# Patient Record
Sex: Female | Born: 1935 | Race: White | Hispanic: No | Smoking: Former smoker
Health system: Southern US, Community
[De-identification: ages and names within clinical notes are randomized; demographics above are authoritative.]

## PROBLEM LIST (undated history)

## (undated) DIAGNOSIS — I82409 Acute embolism and thrombosis of unspecified deep veins of unspecified lower extremity: Secondary | ICD-10-CM

## (undated) DIAGNOSIS — M1990 Unspecified osteoarthritis, unspecified site: Secondary | ICD-10-CM

## (undated) DIAGNOSIS — K449 Diaphragmatic hernia without obstruction or gangrene: Secondary | ICD-10-CM

## (undated) DIAGNOSIS — C189 Malignant neoplasm of colon, unspecified: Secondary | ICD-10-CM

## (undated) DIAGNOSIS — I219 Acute myocardial infarction, unspecified: Secondary | ICD-10-CM

## (undated) DIAGNOSIS — I10 Essential (primary) hypertension: Secondary | ICD-10-CM

## (undated) DIAGNOSIS — I2510 Atherosclerotic heart disease of native coronary artery without angina pectoris: Secondary | ICD-10-CM

## (undated) DIAGNOSIS — G629 Polyneuropathy, unspecified: Secondary | ICD-10-CM

## (undated) DIAGNOSIS — F329 Major depressive disorder, single episode, unspecified: Secondary | ICD-10-CM

## (undated) DIAGNOSIS — F419 Anxiety disorder, unspecified: Secondary | ICD-10-CM

## (undated) DIAGNOSIS — K635 Polyp of colon: Secondary | ICD-10-CM

## (undated) DIAGNOSIS — I509 Heart failure, unspecified: Secondary | ICD-10-CM

## (undated) DIAGNOSIS — R32 Unspecified urinary incontinence: Secondary | ICD-10-CM

## (undated) DIAGNOSIS — D649 Anemia, unspecified: Secondary | ICD-10-CM

## (undated) DIAGNOSIS — K219 Gastro-esophageal reflux disease without esophagitis: Secondary | ICD-10-CM

## (undated) DIAGNOSIS — A419 Sepsis, unspecified organism: Secondary | ICD-10-CM

## (undated) DIAGNOSIS — C801 Malignant (primary) neoplasm, unspecified: Secondary | ICD-10-CM

## (undated) DIAGNOSIS — E785 Hyperlipidemia, unspecified: Secondary | ICD-10-CM

## (undated) DIAGNOSIS — E039 Hypothyroidism, unspecified: Secondary | ICD-10-CM

## (undated) DIAGNOSIS — H269 Unspecified cataract: Secondary | ICD-10-CM

## (undated) DIAGNOSIS — R410 Disorientation, unspecified: Secondary | ICD-10-CM

## (undated) HISTORY — DX: Polyp of colon: K63.5

## (undated) HISTORY — DX: Anemia, unspecified: D64.9

## (undated) HISTORY — PX: COLONOSCOPY W/ BIOPSIES: SHX1374

## (undated) HISTORY — DX: Hypothyroidism, unspecified: E03.9

## (undated) HISTORY — DX: Atherosclerotic heart disease of native coronary artery without angina pectoris: I25.10

## (undated) HISTORY — DX: Anxiety disorder, unspecified: F41.9

## (undated) HISTORY — DX: Essential (primary) hypertension: I10

## (undated) HISTORY — DX: Unspecified urinary incontinence: R32

## (undated) HISTORY — DX: Hyperlipidemia, unspecified: E78.5

## (undated) HISTORY — DX: Diaphragmatic hernia without obstruction or gangrene: K44.9

## (undated) HISTORY — DX: Malignant neoplasm of colon, unspecified: C18.9

## (undated) HISTORY — PX: OTHER SURGICAL HISTORY: SHX169

## (undated) HISTORY — PX: THYROID SURGERY: SHX805

## (undated) HISTORY — DX: Unspecified osteoarthritis, unspecified site: M19.90

## (undated) HISTORY — DX: Unspecified cataract: H26.9

## (undated) HISTORY — PX: LUMBAR LAMINECTOMY: SHX95

## (undated) HISTORY — PX: ESOPHAGOGASTRODUODENOSCOPY: SHX1529

## (undated) HISTORY — DX: Polyneuropathy, unspecified: G62.9

---
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
## 1997-07-12 DIAGNOSIS — I2510 Atherosclerotic heart disease of native coronary artery without angina pectoris: Secondary | ICD-10-CM

## 1997-07-12 HISTORY — DX: Atherosclerotic heart disease of native coronary artery without angina pectoris: I25.10

## 1997-07-12 HISTORY — PX: CORONARY ARTERY BYPASS GRAFT: SHX141

## 1997-12-26 ENCOUNTER — Other Ambulatory Visit: Admission: RE | Admit: 1997-12-26 | Discharge: 1997-12-26 | Payer: Self-pay | Admitting: Obstetrics and Gynecology

## 1998-01-28 ENCOUNTER — Inpatient Hospital Stay (HOSPITAL_COMMUNITY): Admission: EM | Admit: 1998-01-28 | Discharge: 1998-02-08 | Payer: Self-pay | Admitting: Emergency Medicine

## 1998-07-12 HISTORY — PX: CAROTID STENT: SHX1301

## 1998-07-16 ENCOUNTER — Inpatient Hospital Stay (HOSPITAL_COMMUNITY): Admission: EM | Admit: 1998-07-16 | Discharge: 1998-07-22 | Payer: Self-pay | Admitting: *Deleted

## 1998-07-17 ENCOUNTER — Encounter: Payer: Self-pay | Admitting: Cardiovascular Disease

## 1999-01-14 ENCOUNTER — Other Ambulatory Visit: Admission: RE | Admit: 1999-01-14 | Discharge: 1999-01-14 | Payer: Self-pay | Admitting: Family Medicine

## 1999-06-22 ENCOUNTER — Encounter: Admission: RE | Admit: 1999-06-22 | Discharge: 1999-07-10 | Payer: Self-pay | Admitting: *Deleted

## 2000-01-26 ENCOUNTER — Other Ambulatory Visit: Admission: RE | Admit: 2000-01-26 | Discharge: 2000-01-26 | Payer: Self-pay | Admitting: Family Medicine

## 2000-03-09 ENCOUNTER — Ambulatory Visit (HOSPITAL_COMMUNITY): Admission: RE | Admit: 2000-03-09 | Discharge: 2000-03-09 | Payer: Self-pay | Admitting: Cardiovascular Disease

## 2000-06-15 ENCOUNTER — Ambulatory Visit: Admission: RE | Admit: 2000-06-15 | Discharge: 2000-06-15 | Payer: Self-pay | Admitting: Cardiovascular Disease

## 2000-09-19 ENCOUNTER — Ambulatory Visit: Admission: RE | Admit: 2000-09-19 | Discharge: 2000-09-19 | Payer: Self-pay | Admitting: Cardiovascular Disease

## 2001-01-31 ENCOUNTER — Other Ambulatory Visit: Admission: RE | Admit: 2001-01-31 | Discharge: 2001-01-31 | Payer: Self-pay | Admitting: Family Medicine

## 2001-08-02 ENCOUNTER — Ambulatory Visit: Admission: RE | Admit: 2001-08-02 | Discharge: 2001-08-02 | Payer: Self-pay | Admitting: Cardiovascular Disease

## 2001-10-10 HISTORY — PX: CORONARY STENT PLACEMENT: SHX1402

## 2001-10-11 ENCOUNTER — Encounter: Payer: Self-pay | Admitting: Internal Medicine

## 2001-10-11 ENCOUNTER — Inpatient Hospital Stay (HOSPITAL_COMMUNITY): Admission: AD | Admit: 2001-10-11 | Discharge: 2001-10-13 | Payer: Self-pay | Admitting: Internal Medicine

## 2002-02-02 ENCOUNTER — Other Ambulatory Visit: Admission: RE | Admit: 2002-02-02 | Discharge: 2002-02-02 | Payer: Self-pay | Admitting: Family Medicine

## 2002-03-29 ENCOUNTER — Ambulatory Visit (HOSPITAL_COMMUNITY): Admission: RE | Admit: 2002-03-29 | Discharge: 2002-03-29 | Payer: Self-pay | Admitting: Cardiovascular Disease

## 2002-07-16 ENCOUNTER — Encounter: Admission: RE | Admit: 2002-07-16 | Discharge: 2002-07-16 | Payer: Self-pay | Admitting: Family Medicine

## 2002-07-16 ENCOUNTER — Encounter: Payer: Self-pay | Admitting: Family Medicine

## 2002-08-14 ENCOUNTER — Encounter (INDEPENDENT_AMBULATORY_CARE_PROVIDER_SITE_OTHER): Payer: Self-pay | Admitting: Gastroenterology

## 2002-08-16 ENCOUNTER — Encounter: Payer: Self-pay | Admitting: Gastroenterology

## 2002-08-16 ENCOUNTER — Ambulatory Visit (HOSPITAL_COMMUNITY): Admission: RE | Admit: 2002-08-16 | Discharge: 2002-08-16 | Payer: Self-pay | Admitting: Gastroenterology

## 2002-10-08 ENCOUNTER — Encounter: Payer: Self-pay | Admitting: Gastroenterology

## 2002-10-08 ENCOUNTER — Ambulatory Visit (HOSPITAL_COMMUNITY): Admission: RE | Admit: 2002-10-08 | Discharge: 2002-10-08 | Payer: Self-pay | Admitting: Gastroenterology

## 2003-04-11 ENCOUNTER — Other Ambulatory Visit: Admission: RE | Admit: 2003-04-11 | Discharge: 2003-04-11 | Payer: Self-pay | Admitting: Family Medicine

## 2003-05-30 ENCOUNTER — Ambulatory Visit (HOSPITAL_COMMUNITY): Admission: RE | Admit: 2003-05-30 | Discharge: 2003-05-30 | Payer: Self-pay | Admitting: Cardiovascular Disease

## 2004-04-07 ENCOUNTER — Other Ambulatory Visit: Admission: RE | Admit: 2004-04-07 | Discharge: 2004-04-07 | Payer: Self-pay | Admitting: Family Medicine

## 2004-06-26 ENCOUNTER — Ambulatory Visit: Payer: Self-pay

## 2004-06-26 ENCOUNTER — Ambulatory Visit: Payer: Self-pay | Admitting: Cardiovascular Disease

## 2004-10-20 ENCOUNTER — Ambulatory Visit (HOSPITAL_COMMUNITY): Admission: RE | Admit: 2004-10-20 | Discharge: 2004-10-20 | Payer: Self-pay | Admitting: Family Medicine

## 2005-08-16 ENCOUNTER — Ambulatory Visit: Payer: Self-pay | Admitting: Gastroenterology

## 2005-08-26 ENCOUNTER — Ambulatory Visit: Payer: Self-pay | Admitting: Cardiovascular Disease

## 2005-08-31 ENCOUNTER — Ambulatory Visit: Payer: Self-pay | Admitting: Gastroenterology

## 2005-09-14 ENCOUNTER — Other Ambulatory Visit: Admission: RE | Admit: 2005-09-14 | Discharge: 2005-09-14 | Payer: Self-pay | Admitting: Family Medicine

## 2006-03-11 ENCOUNTER — Ambulatory Visit: Payer: Self-pay | Admitting: Cardiovascular Disease

## 2006-03-11 ENCOUNTER — Ambulatory Visit: Payer: Self-pay

## 2006-09-15 ENCOUNTER — Ambulatory Visit: Payer: Self-pay | Admitting: Cardiovascular Disease

## 2006-11-17 ENCOUNTER — Ambulatory Visit: Payer: Self-pay | Admitting: Cardiovascular Disease

## 2006-11-17 LAB — CONVERTED CEMR LAB
ALT: 27 units/L (ref 0–40)
AST: 22 units/L (ref 0–37)
Albumin: 3.7 g/dL (ref 3.5–5.2)
Alkaline Phosphatase: 33 units/L — ABNORMAL LOW (ref 39–117)
Bilirubin, Direct: 0.1 mg/dL (ref 0.0–0.3)
Total Bilirubin: 0.7 mg/dL (ref 0.3–1.2)
Total Protein: 6.7 g/dL (ref 6.0–8.3)

## 2006-12-12 ENCOUNTER — Ambulatory Visit: Payer: Self-pay | Admitting: Cardiovascular Disease

## 2007-02-22 ENCOUNTER — Encounter: Payer: Self-pay | Admitting: Cardiovascular Disease

## 2007-02-22 ENCOUNTER — Ambulatory Visit: Payer: Self-pay

## 2007-03-30 ENCOUNTER — Ambulatory Visit: Payer: Self-pay

## 2007-03-30 ENCOUNTER — Ambulatory Visit: Payer: Self-pay | Admitting: Cardiovascular Disease

## 2007-09-20 ENCOUNTER — Ambulatory Visit: Payer: Self-pay | Admitting: Cardiovascular Disease

## 2007-11-06 ENCOUNTER — Ambulatory Visit: Payer: Self-pay | Admitting: Cardiovascular Disease

## 2008-01-24 ENCOUNTER — Ambulatory Visit: Payer: Self-pay | Admitting: Internal Medicine

## 2008-01-24 DIAGNOSIS — R195 Other fecal abnormalities: Secondary | ICD-10-CM

## 2008-01-24 DIAGNOSIS — Z860100 Personal history of colon polyps, unspecified: Secondary | ICD-10-CM | POA: Insufficient documentation

## 2008-01-24 DIAGNOSIS — K625 Hemorrhage of anus and rectum: Secondary | ICD-10-CM

## 2008-01-26 ENCOUNTER — Ambulatory Visit: Payer: Self-pay | Admitting: Internal Medicine

## 2008-01-26 ENCOUNTER — Encounter: Admission: RE | Admit: 2008-01-26 | Discharge: 2008-01-26 | Payer: Self-pay | Admitting: Family Medicine

## 2008-01-26 ENCOUNTER — Encounter: Payer: Self-pay | Admitting: Internal Medicine

## 2008-01-26 DIAGNOSIS — D649 Anemia, unspecified: Secondary | ICD-10-CM

## 2008-01-30 LAB — CONVERTED CEMR LAB
Basophils Absolute: 0 10*3/uL (ref 0.0–0.1)
Basophils Relative: 0.4 % (ref 0.0–3.0)
Eosinophils Absolute: 0.2 10*3/uL (ref 0.0–0.7)
Eosinophils Relative: 3.2 % (ref 0.0–5.0)
HCT: 36.1 % (ref 36.0–46.0)
Hemoglobin: 12.2 g/dL (ref 12.0–15.0)
Lymphocytes Relative: 37.9 % (ref 12.0–46.0)
MCHC: 33.8 g/dL (ref 30.0–36.0)
MCV: 94.5 fL (ref 78.0–100.0)
Monocytes Absolute: 0.5 10*3/uL (ref 0.1–1.0)
Monocytes Relative: 7.2 % (ref 3.0–12.0)
Neutro Abs: 3.3 10*3/uL (ref 1.4–7.7)
Neutrophils Relative %: 51.3 % (ref 43.0–77.0)
Platelets: 297 10*3/uL (ref 150–400)
RBC: 3.82 M/uL — ABNORMAL LOW (ref 3.87–5.11)
RDW: 12.4 % (ref 11.5–14.6)
WBC: 6.5 10*3/uL (ref 4.5–10.5)

## 2008-02-01 ENCOUNTER — Encounter: Payer: Self-pay | Admitting: Internal Medicine

## 2008-05-03 ENCOUNTER — Ambulatory Visit: Payer: Self-pay | Admitting: Cardiovascular Disease

## 2008-05-20 DIAGNOSIS — E7800 Pure hypercholesterolemia, unspecified: Secondary | ICD-10-CM | POA: Insufficient documentation

## 2008-05-20 DIAGNOSIS — I119 Hypertensive heart disease without heart failure: Secondary | ICD-10-CM

## 2008-05-20 DIAGNOSIS — I2510 Atherosclerotic heart disease of native coronary artery without angina pectoris: Secondary | ICD-10-CM

## 2008-05-20 DIAGNOSIS — E039 Hypothyroidism, unspecified: Secondary | ICD-10-CM

## 2008-10-18 ENCOUNTER — Encounter: Payer: Self-pay | Admitting: Cardiovascular Disease

## 2008-10-18 ENCOUNTER — Ambulatory Visit: Payer: Self-pay | Admitting: Cardiovascular Disease

## 2008-10-18 DIAGNOSIS — R55 Syncope and collapse: Secondary | ICD-10-CM | POA: Insufficient documentation

## 2009-01-31 ENCOUNTER — Telehealth: Payer: Self-pay | Admitting: Cardiovascular Disease

## 2009-02-12 ENCOUNTER — Telehealth: Payer: Self-pay | Admitting: Cardiovascular Disease

## 2009-03-20 ENCOUNTER — Encounter (INDEPENDENT_AMBULATORY_CARE_PROVIDER_SITE_OTHER): Payer: Self-pay | Admitting: *Deleted

## 2009-03-24 ENCOUNTER — Encounter: Admission: RE | Admit: 2009-03-24 | Discharge: 2009-03-24 | Payer: Self-pay | Admitting: Family Medicine

## 2009-05-08 ENCOUNTER — Ambulatory Visit: Payer: Self-pay | Admitting: Cardiovascular Disease

## 2009-06-02 ENCOUNTER — Telehealth: Payer: Self-pay | Admitting: Cardiovascular Disease

## 2009-07-01 ENCOUNTER — Encounter: Payer: Self-pay | Admitting: Cardiovascular Disease

## 2009-07-12 HISTORY — PX: CHOLECYSTECTOMY: SHX55

## 2009-09-26 ENCOUNTER — Encounter: Payer: Self-pay | Admitting: Cardiovascular Disease

## 2009-09-30 ENCOUNTER — Ambulatory Visit: Payer: Self-pay | Admitting: Cardiovascular Disease

## 2009-10-24 ENCOUNTER — Ambulatory Visit (HOSPITAL_COMMUNITY): Admission: RE | Admit: 2009-10-24 | Discharge: 2009-10-24 | Payer: Self-pay | Admitting: Orthopaedic Surgery

## 2009-12-25 ENCOUNTER — Telehealth: Payer: Self-pay | Admitting: Cardiovascular Disease

## 2010-01-19 ENCOUNTER — Telehealth: Payer: Self-pay | Admitting: Cardiovascular Disease

## 2010-03-23 ENCOUNTER — Encounter: Payer: Self-pay | Admitting: Cardiovascular Disease

## 2010-04-24 ENCOUNTER — Ambulatory Visit: Payer: Self-pay | Admitting: Cardiovascular Disease

## 2010-04-24 ENCOUNTER — Encounter: Payer: Self-pay | Admitting: Cardiovascular Disease

## 2010-06-02 ENCOUNTER — Ambulatory Visit: Payer: Self-pay | Admitting: Internal Medicine

## 2010-06-02 ENCOUNTER — Telehealth: Payer: Self-pay | Admitting: Internal Medicine

## 2010-06-02 DIAGNOSIS — K5900 Constipation, unspecified: Secondary | ICD-10-CM | POA: Insufficient documentation

## 2010-06-02 LAB — CONVERTED CEMR LAB
BUN: 20 mg/dL (ref 6–23)
CO2: 34 meq/L — ABNORMAL HIGH (ref 19–32)
Chloride: 102 meq/L (ref 96–112)
Creatinine, Ser: 1 mg/dL (ref 0.4–1.2)
Potassium: 4.7 meq/L (ref 3.5–5.1)

## 2010-06-22 ENCOUNTER — Encounter: Payer: Self-pay | Admitting: Cardiovascular Disease

## 2010-08-11 NOTE — Assessment & Plan Note (Signed)
Summary: f86m/dm   Primary Provider:  Rudi Heap MD   History of Present Illness: Rachel Conner is seen today in followup for history of coronary artery disease and bypass surgery.  I believe her bypass was in 99.  He subsequently had a stenting of the native RCA she has an occluded vein graft to the right.  She also has a stent in the native obtuse marginal branch.  Her last catheter was in 2003.  Her last Myoview in 2008 was nonischemic.her ejection fraction 64% at that time. She is not having t any significant chest pain.  She has had a persistant dry cough with lisinipril  We will switch her to Cozaar.  She continues to need to work on her diet and carb intake.  He BS has been ok as has her cholesterol as checked by her primary  She had succesful gallbladder surgery in March.  She has had a history of polyp removal about 3 years ago and had some "blood in her stool" post op.  We will get her in to see gi for F/U colonoscopy  Current Problems (verified): 1)  Syncope, Vasovagal  (ICD-780.2) 2)  Hypothyroidism  (ICD-244.9) 3)  Hypercholesterolemia, Mixed  (ICD-272.0) 4)  Hypertensive Cardiovascular Disease, Benign  (ICD-402.10) 5)  Coronary Atherosclerosis, Native Vessel  (ICD-414.01) 6)  Anemia  (ICD-285.9) 7)  Blood in Stool, Occult  (ICD-792.1) 8)  Personal Hx Colonic Polyps  (ICD-V12.72) 9)  Rectal Bleeding  (ICD-569.3)  Current Medications (verified): 1)  Crestor 20 Mg Tabs (Rosuvastatin Calcium) .... Take One Tablet By Mouth Daily. 2)  Vitamin E 400 Unit  Caps (Vitamin E) 3)  Synthroid 100 Mcg  Tabs (Levothyroxine Sodium) .... Take 1 Tablet By Mouth Once A Day 4)  Bayer Aspirin 325 Mg  Tabs (Aspirin) .Marland Kitchen.. 1 Once Daily 5)  Alprazolam 0.5 Mg  Tabs (Alprazolam) .... As Needed 6)  Prozac 20 Mg  Caps (Fluoxetine Hcl) .... Take 1 Tablet By Mouth Once A Day 7)  Niaspan 500 Mg  Cr-Tabs (Niacin (Antihyperlipidemic)) .... Take 1 Tablet By Mouth Once A Day 8)  Pantoprazole Sodium 40 Mg  Tbec  (Pantoprazole Sodium) .Marland Kitchen.. 1qd 9)  Tricor 145 Mg  Tabs (Fenofibrate) .Marland Kitchen.. 1 Once Daily 10)  Metoprolol Tartrate 50 Mg Tabs (Metoprolol Tartrate) .... Take One Tablet By Mouth Twice A Day 11)  Multivitamins   Tabs (Multiple Vitamin) .Marland Kitchen.. 1 Once Daily 12)  Fish Oil   Oil (Fish Oil) .Marland Kitchen.. 1 Once Daily 13)  Cozaar 50 Mg Tabs (Losartan Potassium) .Marland Kitchen.. 1 Once Daily 14)  Vitamin D 1000 Unit Tabs (Cholecalciferol) .... 2 Tabs Po Once Daily  Allergies (verified): 1)  ! * Nitroglycerin  Past History:  Past Medical History: Last updated: 05/20/2008 Arthritis CAD/CABG Diabetes Hyperlipidemia Hypertension Hypothyroidism Colon Polyps Hiatal Hernia GERD Peripheral neuropathy Anxiety  Past Surgical History: Last updated: 01/23/2008 CABG 1999 Stent RCA 07/1998 Circumflex stent 4/03 Back Surgery Thyroid Surgery  Family History: Last updated: 01/24/2008 No FH of Colon Cancer: Family History of Heart Disease: father grandfather brother mother  Social History: Last updated: 01/24/2008 Occupation: retired Patient is a former smoker.  Alcohol Use - no Illicit Drug Use - no Patient does not get regular exercise.  3 grown children  Review of Systems       Denies fever, malais, weight loss, blurry vision, decreased visual acuity, cough, sputum, SOB, hemoptysis, pleuritic pain, palpitaitons, heartburn, abdominal pain, melena, lower extremity edema, claudication, or rash.   Vital Signs:  Patient profile:  75 year old female Height:      65 inches Weight:      231 pounds BMI:     38.58 Pulse rate:   66 / minute Resp:     16 per minute BP sitting:   162 / 78  (left arm)  Vitals Entered By: Marrion Coy, CNA (April 24, 2010 11:15 AM)  Physical Exam  General:  Affect appropriate Healthy:  appears stated age HEENT: normal Neck supple with no adenopathy JVP normal no bruits no thyromegaly Lungs clear with no wheezing and good diaphragmatic motion Heart:  S1/S2 no murmur,rub,  gallop or click PMI normal Abdomen: benighn, BS positve, no tenderness, no AAA no bruit.  No HSM or HJR Distal pulses intact with no bruits No edema Neuro non-focal Skin warm and dry    Impression & Recommendations:  Problem # 1:  HYPERCHOLESTEROLEMIA, MIXED (ICD-272.0) Will get labs from Dr Georgia Lopes Her updated medication list for this problem includes:    Crestor 20 Mg Tabs (Rosuvastatin calcium) .Marland Kitchen... Take one tablet by mouth daily.    Niaspan 500 Mg Cr-tabs (Niacin (antihyperlipidemic)) .Marland Kitchen... Take 1 tablet by mouth once a day    Tricor 145 Mg Tabs (Fenofibrate) .Marland Kitchen... 1 once daily  Problem # 2:  HYPERTENSIVE CARDIOVASCULAR DISEASE, BENIGN (ICD-402.10)  Well contorlled Her updated medication list for this problem includes:    Bayer Aspirin 325 Mg Tabs (Aspirin) .Marland Kitchen... 1 once daily    Metoprolol Tartrate 50 Mg Tabs (Metoprolol tartrate) .Marland Kitchen... Take one tablet by mouth twice a day    Cozaar 50 Mg Tabs (Losartan potassium) .Marland Kitchen... 1 once daily  Her updated medication list for this problem includes:    Bayer Aspirin 325 Mg Tabs (Aspirin) .Marland Kitchen... 1 once daily    Metoprolol Tartrate 50 Mg Tabs (Metoprolol tartrate) .Marland Kitchen... Take one tablet by mouth twice a day    Cozaar 50 Mg Tabs (Losartan potassium) .Marland Kitchen... 1 once daily  Problem # 3:  CORONARY ATHEROSCLEROSIS, NATIVE VESSEL (ICD-414.01)  Stable no angina Her updated medication list for this problem includes:    Bayer Aspirin 325 Mg Tabs (Aspirin) .Marland Kitchen... 1 once daily    Metoprolol Tartrate 50 Mg Tabs (Metoprolol tartrate) .Marland Kitchen... Take one tablet by mouth twice a day  Orders: EKG w/ Interpretation (93000)  Problem # 4:  BLOOD IN STOOL, OCCULT (ICD-792.1) Arrange F/u with gi for colonoscopy given history of polyps 3 years ago  Other Orders: Gastroenterology Referral (GI)  Patient Instructions: 1)  Your physician recommends that you schedule a follow-up appointment in: 6 MONTHS WITH DR Eden Emms 2)  Your physician recommends that you  continue on your current medications as directed. Please refer to the Current Medication list given to you today. 3)  You have been referred to GI REFERRAL F/U ON POLYPS AND BLOOD IN STOOL

## 2010-08-11 NOTE — Assessment & Plan Note (Signed)
Summary: F6M/CY   Primary Provider:  Rudi Heap MD  CC:  surgical clearnce gallbladder.  History of Present Illness: Rachel Conner is seen today in followup for history of coronary artery disease and bypass surgery.  I believe her bypass was in 99.  He subsequently had a stenting of the native RCA she has an occluded vein graft to the right.  She also has a stent in the native obtuse marginal branch.  Her last catheter was in 2003.  Her last Myoview in 2008 was nonischemic.her ejection fraction 64% at that time. She is not having t any significant chest pain.  She has had a persistant dry cough with lisinipril  We will switch her to Cozaar.  She continues to need to work on her diet and carb intake.  He BS has been ok as has her cholesterol as checked by her primary  She has had gallbladder problems the last 3 weeks and is scheduled to have a lap choly with Dr Rayburn Ma soon.  I told her she is cleared to have this and can stop ASA 5 days before.  We are happy to follow her in the periop phase if needed.    Current Problems (verified): 1)  Syncope, Vasovagal  (ICD-780.2) 2)  Hypothyroidism  (ICD-244.9) 3)  Hypercholesterolemia, Mixed  (ICD-272.0) 4)  Hypertensive Cardiovascular Disease, Benign  (ICD-402.10) 5)  Coronary Atherosclerosis, Native Vessel  (ICD-414.01) 6)  Anemia  (ICD-285.9) 7)  Blood in Stool, Occult  (ICD-792.1) 8)  Personal Hx Colonic Polyps  (ICD-V12.72) 9)  Rectal Bleeding  (ICD-569.3)  Current Medications (verified): 1)  Crestor 20 Mg Tabs (Rosuvastatin Calcium) .... Take One Tablet By Mouth Daily. 2)  Vitamin E 400 Unit  Caps (Vitamin E) 3)  Synthroid 100 Mcg  Tabs (Levothyroxine Sodium) .... Take 1 Tablet By Mouth Once A Day 4)  Bayer Aspirin 325 Mg  Tabs (Aspirin) .Marland Kitchen.. 1 Once Daily 5)  Alprazolam 0.5 Mg  Tabs (Alprazolam) .... As Needed 6)  Prozac 20 Mg  Caps (Fluoxetine Hcl) .... Take 1 Tablet By Mouth Once A Day 7)  Niaspan 500 Mg  Cr-Tabs (Niacin  (Antihyperlipidemic)) .... Take 1 Tablet By Mouth Once A Day 8)  Pantoprazole Sodium 40 Mg  Tbec (Pantoprazole Sodium) .Marland Kitchen.. 1qd 9)  Tricor 145 Mg  Tabs (Fenofibrate) .Marland Kitchen.. 1 Once Daily 10)  Metoprolol Tartrate 50 Mg Tabs (Metoprolol Tartrate) .... Take One Tablet By Mouth Twice A Day 11)  Multivitamins   Tabs (Multiple Vitamin) .Marland Kitchen.. 1 Once Daily 12)  Fish Oil   Oil (Fish Oil) .Marland Kitchen.. 1 Once Daily 13)  Cozaar 50 Mg Tabs (Losartan Potassium) .Marland Kitchen.. 1 Once Daily 14)  Vitamin D 1000 Unit Tabs (Cholecalciferol) .... 2 Tabs Po Once Daily  Allergies (verified): 1)  ! * Nitroglycerin  Past History:  Past Medical History: Last updated: 05/20/2008 Arthritis CAD/CABG Diabetes Hyperlipidemia Hypertension Hypothyroidism Colon Polyps Hiatal Hernia GERD Peripheral neuropathy Anxiety  Past Surgical History: Last updated: 01/23/2008 CABG 1999 Stent RCA 07/1998 Circumflex stent 4/03 Back Surgery Thyroid Surgery  Family History: Last updated: 01/24/2008 No FH of Colon Cancer: Family History of Heart Disease: father grandfather brother mother  Social History: Last updated: 01/24/2008 Occupation: retired Patient is a former smoker.  Alcohol Use - no Illicit Drug Use - no Patient does not get regular exercise.  3 grown children  Review of Systems       Denies fever, malais, weight loss, blurry vision, decreased visual acuity, cough, sputum, SOB, hemoptysis, pleuritic pain, palpitaitons,  heartburn, abdominal pain, melena, lower extremity edema, claudication, or rash.   Vital Signs:  Patient profile:   75 year old female Height:      65 inches Weight:      231 pounds BMI:     38.58 Pulse rate:   63 / minute Resp:     14 per minute BP sitting:   151 / 79  (left arm)  Vitals Entered By: Kem Parkinson (September 30, 2009 10:23 AM)  Physical Exam  General:  Affect appropriate Healthy:  appears stated age HEENT: normal Neck supple with no adenopathy JVP normal no bruits no  thyromegaly Lungs clear with no wheezing and good diaphragmatic motion Heart:  S1/S2 no murmur,rub, gallop or click PMI normal Abdomen: benighn, BS positve, no tenderness, no AAA no bruit.  No HSM or HJR Distal pulses intact with no bruits No edema Neuro non-focal Skin warm and dry    Impression & Recommendations:  Problem # 1:  HYPERCHOLESTEROLEMIA, MIXED (ICD-272.0) Continue 3 drug Rx Her updated medication list for this problem includes:    Crestor 20 Mg Tabs (Rosuvastatin calcium) .Marland Kitchen... Take one tablet by mouth daily.    Niaspan 500 Mg Cr-tabs (Niacin (antihyperlipidemic)) .Marland Kitchen... Take 1 tablet by mouth once a day    Tricor 145 Mg Tabs (Fenofibrate) .Marland Kitchen... 1 once daily  Problem # 2:  HYPERTENSIVE CARDIOVASCULAR DISEASE, BENIGN (ICD-402.10) Stable contiue ARB and BB Her updated medication list for this problem includes:    Bayer Aspirin 325 Mg Tabs (Aspirin) .Marland Kitchen... 1 once daily    Metoprolol Tartrate 50 Mg Tabs (Metoprolol tartrate) .Marland Kitchen... Take one tablet by mouth twice a day    Cozaar 50 Mg Tabs (Losartan potassium) .Marland Kitchen... 1 once daily  Problem # 3:  CORONARY ATHEROSCLEROSIS, NATIVE VESSEL (ICD-414.01) No angina.  Clear for GB surgery.  Continue BB and hold ASA 5 days before surgery Her updated medication list for this problem includes:    Bayer Aspirin 325 Mg Tabs (Aspirin) .Marland Kitchen... 1 once daily    Metoprolol Tartrate 50 Mg Tabs (Metoprolol tartrate) .Marland Kitchen... Take one tablet by mouth twice a day

## 2010-08-11 NOTE — Letter (Signed)
Summary: [facility] Clearance   Imported By: Roderic Ovens 10/15/2009 16:08:37  _____________________________________________________________________  External Attachment:    Type:   Image     Comment:   External Document

## 2010-08-11 NOTE — Progress Notes (Signed)
Summary: need referral for husband  Phone Note Call from Patient Call back at Home Phone [phone]   Caller: Spouse Summary of Call: Pt calling to get referral for for husband Initial call taken by: Judie Grieve,  January 19, 2010 4:07 PM  Follow-up for Phone Call        spoke with pt, number for [facility] kidney given Deliah Goody, RN  January 19, 2010 5:15 PM

## 2010-08-11 NOTE — Letter (Signed)
Summary: Dr Donivan Scull Office Note  Dr Donivan Scull Office Note   Imported By: Roderic Ovens 10/29/2009 10:33:15  _____________________________________________________________________  External Attachment:    Type:   Image     Comment:   External Document

## 2010-08-11 NOTE — Assessment & Plan Note (Signed)
Summary: BLOOD IN STOOLS/YF   History of Present Illness Visit Type: Initial Consult Primary GI MD: Stan Head MD [facility] Primary Genavie Boettger: Rudi Heap, MD  Requesting Eiden Bagot: Wendall Stade, MD  Chief Complaint: Rectal bleeding  History of Present Illness:   75 yo ww she had a cholecystectomy recently  since then became constipated and then actually at least once had to disimpact (several times actually) using jelly and  gloved finger. She has experinced red blood on the toilet papar and some on the stool She is using stool softener with some success but she still has to strain to stool. Claims high fber diet  some fatiggue, mild weight gain  Last labs 6 months ago - ? when TSH last checked      GI Review of Systems      Denies abdominal pain, acid reflux, belching, bloating, chest pain, dysphagia with liquids, dysphagia with solids, heartburn, loss of appetite, nausea, vomiting, vomiting blood, weight loss, and  weight gain.      Reports constipation and  rectal bleeding.     Denies anal fissure, black tarry stools, change in bowel habit, diarrhea, diverticulosis, fecal incontinence, heme positive stool, hemorrhoids, irritable bowel syndrome, jaundice, light color stool, liver problems, and  rectal pain.    Colonoscopy  Procedure date:  01/26/2008  Findings:      1) FOUR DIMINUTIVE POLYPS REMOVED 2) EXTERNAL HEMORRHOIDS 3) OTHERWISE NORMAL COLONOSCOPY TO CECUM WITH GOOD PREP 1. CECUM, ASCENDING AND SPLENIC FLEXURE COLON, POLYP(S):  ADENOMATOUS POLYP(S).  NO HIGH GRADE DYSPLASIA OR INVASIVE MALIGNANCY IDENTIFIED.   2.  DESCENDING COLON:  POLYPOID COLORECTAL MUCOSA.  NO ADENOMATOUS CHANGE OR MALIGNANCY IDENTIFIED.  Comments:      Repeat colonoscopy in 3 years.     Procedures Next Due Date:    Colonoscopy: 02/2011   Current Medications (verified): 1)  Crestor 20 Mg Tabs (Rosuvastatin Calcium) .... Take One Tablet By Mouth Daily. 2)  Vitamin E 400 Unit  Caps  (Vitamin E) 3)  Synthroid 100 Mcg  Tabs (Levothyroxine Sodium) .... Take 1 Tablet By Mouth Once A Day 4)  Bayer Aspirin 325 Mg  Tabs (Aspirin) .Marland Kitchen.. 1 Once Daily 5)  Alprazolam 0.5 Mg  Tabs (Alprazolam) .... As Needed 6)  Prozac 20 Mg  Caps (Fluoxetine Hcl) .... Take 1 Tablet By Mouth Once A Day 7)  Niaspan 500 Mg  Cr-Tabs (Niacin (Antihyperlipidemic)) .... Take 1 Tablet By Mouth Once A Day 8)  Pantoprazole Sodium 40 Mg  Tbec (Pantoprazole Sodium) .Marland Kitchen.. 1qd 9)  Tricor 145 Mg  Tabs (Fenofibrate) .Marland Kitchen.. 1 Once Daily 10)  Metoprolol Tartrate 50 Mg Tabs (Metoprolol Tartrate) .... Take One Tablet By Mouth Twice A Day 11)  Multivitamins   Tabs (Multiple Vitamin) .Marland Kitchen.. 1 Once Daily 12)  Fish Oil   Oil (Fish Oil) .Marland Kitchen.. 1 Once Daily 13)  Cozaar 50 Mg Tabs (Losartan Potassium) .Marland Kitchen.. 1 Once Daily 14)  Vitamin D 1000 Unit Tabs (Cholecalciferol) .... 2 Tabs Po Once Daily 15)  Stool Softener 100 Mg Caps (Docusate Sodium) .... As Needed  Allergies (verified): 1)  ! * Nitroglycerin  Past History:  Past Medical History: Arthritis CAD/CABG Diabetes Hyperlipidemia Hypertension Hypothyroidism Colon Polyps Hiatal Hernia GERD Peripheral neuropathy Anxiety Hemorrhoids  Past Surgical History: CABG 1999 Stent RCA 07/1998 Circumflex stent 4/03 Back Surgery Thyroid Surgery Cholecystectomy  Family History: Reviewed history from 01/24/2008 and no changes required. No FH of Colon Cancer: Family History of Heart Disease: father grandfather brother mother  Social  History: Reviewed history from 01/24/2008 and no changes required. Occupation: retired Patient is a former smoker.  Alcohol Use - no Illicit Drug Use - no Patient does not get regular exercise.  3 grown children  Vital Signs:  Patient profile:   75 year old female Height:      65 inches Weight:      233 pounds BMI:     38.91 BSA:     2.11 Pulse rate:   68 / minute Pulse rhythm:   regular BP sitting:   132 / 88  (left arm) Cuff  size:   regular  Vitals Entered By: Ok Anis CMA (June 02, 2010 11:25 AM)  Physical Exam  General:  obese.  NAD Eyes:  anicteric Abdomen:  obese, NT BS+  Rectal:  female present no mass, formed Allbright stool filling rectum ANOSCOPY: inflamed hemorrhoids   Impression & Recommendations:  Problem # 1:  RECTAL BLEEDING (ICD-569.3) Assessment Deteriorated This is hemorrhoidal and should improve with improved bowel habits Hydrocortisone  suppositories also  Problem # 2:  CONSTIPATION (ICD-564.00) Assessment: New treat with  Miralax  Orders: TLB-BMP (Basic Metabolic Panel-BMET) (80048-METABOL) TLB-TSH (Thyroid Stimulating Hormone) (84443-TSH)  Problem # 3:  HYPOTHYROIDISM (ICD-244.9) Assessment: Unchanged check TSH  Patient Instructions: 1)  Please go to the basement to have your lab tests drawn today.  2)  We will call you with further follow up after reviewing these results. 3)  Please pick up your medications at your pharmacy. ANUSOL SUPPOSITORIES 4)  Take 4 doses of Miralax tonight over 1-2 hours. 5)  Begin using Miralax as directed below.   6)  Copy sent to : [facility] 7)  The medication list was reviewed and reconciled.  All changed / newly prescribed medications were explained.  A complete medication list was provided to the patient / caregiver. Prescriptions: ANUSOL-HC 25 MG SUPP (HYDROCORTISONE ACETATE) Insert one suppository into rectum for  5 nights then as needed  #21 x 0   Entered by:   Francee Piccolo CMA (AAMA)   Authorized by:   Iva Boop MD, [facility]   Signed by:   Francee Piccolo CMA (AAMA) on 06/02/2010   Method used:   Electronically to        [facility] [phone]* (retail)       [address] [place], [place]  [address]       Ph: [phone] or [phone]       Fax: [phone]   RxID:   [id number]  Patient: IRAIS MOTTRAM Note: All result statuses are Final unless otherwise  noted.  Tests: (1) BMP (METABOL)   Sodium                    141 mEq/L                   135-145   Potassium                 4.7 mEq/L                   3.5-5.1   Chloride                  102 mEq/L                   96-112   Carbon Dioxide       [H]  34 mEq/L                    19-32   Glucose              [H]  105 mg/dL                   09-81   BUN                       20 mg/dL                    1-91   Creatinine                1.0 mg/dL                   4.7-8.2   Calcium                   9.8 mg/dL                   9.5-62.1   GFR                       61.41 mL/min                >60  Tests: (2) TSH (TSH)   FastTSH                   1.43 uIU/mL                 0.35-5.50

## 2010-08-11 NOTE — Progress Notes (Signed)
Summary: Labs ok  Phone Note Outgoing Call   Summary of Call: Let her know that labs are ok TSH and lytes Iva Boop MD, [facility]  June 02, 2010 8:44 PM   Follow-up for Phone Call        notified pt of results.  She will continue with plan discussed in office. Follow-up by: Francee Piccolo CMA Duncan Dull),  June 03, 2010 10:02 AM

## 2010-08-11 NOTE — Progress Notes (Signed)
Summary: referral for her hubsand  Phone Note Call from Patient Call back at Home Phone [phone]   Caller: Patient Reason for Call: Talk to Nurse Summary of Call: per pt wife called,  would like for dr. Eden Emms to recommend a referral for her hubsand  to see a nephrology. Initial call taken by: Lorne Skeens,  December 25, 2009 3:37 PM  Follow-up for Phone Call        Will forward to Dr Eden Emms for his input.   Sander Nephew, RN  Additional Follow-up for Phone Call Additional follow up Details #1::        Ok to make referral Additional Follow-up by: Colon Branch, MD, [facility],  December 29, 2009 9:46 AM    Additional Follow-up for Phone Call Additional follow up Details #2::    spoke with pt wife, he was told his kidneys were not functioning as well. her husband has an appointment this week with a dr Caswell Corwin.he is going to wait to see what he says and then if they needs a referral she will call me back Deliah Goody, RN  December 29, 2009 3:22 PM

## 2010-09-25 ENCOUNTER — Encounter: Payer: Self-pay | Admitting: Cardiovascular Disease

## 2010-09-30 LAB — CBC
HCT: 39.6 % (ref 36.0–46.0)
Platelets: 266 10*3/uL (ref 150–400)
RBC: 4.26 MIL/uL (ref 3.87–5.11)
RDW: 14.2 % (ref 11.5–15.5)
WBC: 7.2 10*3/uL (ref 4.0–10.5)

## 2010-09-30 LAB — BASIC METABOLIC PANEL
CO2: 28 mEq/L (ref 19–32)
Chloride: 107 mEq/L (ref 96–112)
Creatinine, Ser: 0.92 mg/dL (ref 0.4–1.2)
GFR calc Af Amer: >60 mL/min (ref >60)
Potassium: 4.1 mEq/L (ref 3.5–5.1)

## 2010-09-30 LAB — DIFFERENTIAL
Basophils Absolute: 0.1 10*3/uL (ref 0.0–0.1)
Lymphocytes Relative: 37 % (ref 12–46)
Neutro Abs: 3.6 10*3/uL (ref 1.7–7.7)
Neutrophils Relative %: 51 % (ref 43–77)

## 2010-10-15 ENCOUNTER — Encounter: Payer: Self-pay | Admitting: Cardiovascular Disease

## 2010-10-23 ENCOUNTER — Ambulatory Visit (INDEPENDENT_AMBULATORY_CARE_PROVIDER_SITE_OTHER): Payer: Medicare Other | Admitting: Cardiovascular Disease

## 2010-10-23 ENCOUNTER — Encounter: Payer: Self-pay | Admitting: Cardiovascular Disease

## 2010-10-23 VITALS — BP 132/71 | HR 67 | Resp 18 | Ht 65.0 in | Wt 232.0 lb

## 2010-10-23 DIAGNOSIS — I2510 Atherosclerotic heart disease of native coronary artery without angina pectoris: Secondary | ICD-10-CM

## 2010-10-23 DIAGNOSIS — M25569 Pain in unspecified knee: Secondary | ICD-10-CM

## 2010-10-23 DIAGNOSIS — I119 Hypertensive heart disease without heart failure: Secondary | ICD-10-CM

## 2010-10-23 DIAGNOSIS — E7800 Pure hypercholesterolemia, unspecified: Secondary | ICD-10-CM

## 2010-10-23 NOTE — Patient Instructions (Addendum)
Your physician recommends that you schedule a follow-up appointment in: 6 months   Refer to [facility] - Dr Lequita Halt or Dr Charlann Boxer

## 2010-10-23 NOTE — Assessment & Plan Note (Signed)
Well controlled.  Continue current medications and low sodium Dash type diet.    

## 2010-10-23 NOTE — Assessment & Plan Note (Signed)
Cholesterol is at goal.  Continue current dose of statin and diet Rx.  No myalgias or side effects.  F/U  LFT's in 6 months. No results found for this basename: LDLCALC             

## 2010-10-23 NOTE — Progress Notes (Signed)
Rachel Conner is seen today in followup for history of coronary artery disease and bypass surgery.  I believe her bypass was in 99.  He subsequently had a stenting of the native RCA she has an occluded vein graft to the right.  She also has a stent in the native obtuse marginal branch.  Her last catheter was in 2003.  Her last Myoview in 2008 was nonischemic.her ejection fraction 64% at that time. She is not having t any significant chest pain.  She has had a persistant dry cough with lisinipril  We will switch her to Cozaar.  She continues to need to work on her diet and carb intake.  He BS has been ok as has her cholesterol as checked by her primary  She had succesful gallbladder surgery in March.  She had her F/U colonoscopy and there were no issues.  She needs to see an orthopedic surgeon for ? Right knee replacement.    ROS: Denies fever, malais, weight loss, blurry vision, decreased visual acuity, cough, sputum, SOB, hemoptysis, pleuritic pain, palpitaitons, heartburn, abdominal pain, melena, lower extremity edema, claudication, or rash.   General: Affect appropriate Healthy:  appears stated age HEENT: normal Neck supple with no adenopathy JVP normal no bruits no thyromegaly Lungs clear with no wheezing and good diaphragmatic motion Heart:  S1/S2 no murmur,rub, gallop or click PMI normal Abdomen: benighn, BS positve, no tenderness, no AAA no bruit.  No HSM or HJR Distal pulses intact with no bruits No edema Neuro non-focal Skin warm and dry No muscular weakness   Current Outpatient Prescriptions  Medication Sig Dispense Refill  . ALPRAZolam (XANAX) 0.5 MG tablet Take 0.5 mg by mouth at bedtime as needed.        Marland Kitchen aspirin 325 MG tablet Take 325 mg by mouth daily.        Marland Kitchen docusate sodium (STOOL SOFTENER) 100 MG capsule Take 100 mg by mouth as needed.        . fenofibrate (TRICOR) 145 MG tablet Take 145 mg by mouth daily.        . fish oil-omega-3 fatty acids 1000 MG capsule Take 1 g by  mouth daily.        Marland Kitchen FLUoxetine (PROZAC) 20 MG capsule Take 20 mg by mouth daily.        . hydrocortisone (ANUSOL-HC) 25 MG suppository Place 25 mg rectally. Insert one suppository into rectum for 5 nights then as needed       . levothyroxine (SYNTHROID, LEVOTHROID) 100 MCG tablet Take 100 mcg by mouth daily.        Marland Kitchen losartan (COZAAR) 50 MG tablet Take 50 mg by mouth daily.        . metoprolol (LOPRESSOR) 50 MG tablet Take 50 mg by mouth 2 (two) times daily.        . Multiple Vitamin (MULTIVITAMIN) tablet Take 1 tablet by mouth daily.        . niacin (NIASPAN) 500 MG CR tablet Take 500 mg by mouth at bedtime.        . pantoprazole (PROTONIX) 40 MG tablet Take 40 mg by mouth daily.        . polyethylene glycol powder (MIRALAX) powder as needed. Take 1-2 doses (1 pack or 1 tablespoon) daily      . rosuvastatin (CRESTOR) 20 MG tablet Take 20 mg by mouth daily.        . solifenacin (VESICARE) 10 MG tablet Take 5 mg by mouth daily.        Marland Kitchen  vitamin E 400 UNIT capsule Take 1,000 Units by mouth daily.       Marland Kitchen DISCONTD: Cholecalciferol (VITAMIN D) 1000 UNITS capsule Take 1,000 Units by mouth 2 (two) times daily.          Allergies  Nitroglycerin  Electrocardiogram:  Assessment and Plan

## 2010-10-23 NOTE — Assessment & Plan Note (Signed)
Stable no angina 

## 2010-11-24 NOTE — Assessment & Plan Note (Signed)
[facility]                            CARDIOLOGY OFFICE NOTE   Rachel, Conner                     MRN:          [id number]  DATE:11/06/2007                            DOB:          December 28, 1935    Rachel Conner returns today for followup.   She has been doing very well.  She continues to be frustrated about  inability to lose weight; part of this has to do with her diet.  She  likes to eat lot of bread and sweets   She had bypass in 1999.  She had a nonischemic Myoview in September  2008.   She is not having chest pain.  Her blood pressure is been elevated.  We  have been escalating her blood pressure pills.  She has been on  lisinopril 20 a day and still is running somewhat high.  We will switch  her to lisinopril 40/12.5 and add a diuretic as well.  Her lower  extremity neuropathy seems to be improved.  We checked her circulations;  she has no PVD with triphasic pulses in the lower extremities.  She has  been compliant with her medications.  Her hemoglobin A1c has been in the  5 range and her lipid level LDL has been less than 100.   REVIEW OF SYSTEMS:  Otherwise negative.   MEDICATIONS:  The patient's medications include:  1. Crestor 10 mg a day.  2. Vitamin E and D.  3. Synthroid 100 mcg a day.  4. Lisinopril 40/12.5.  5. An aspirin a day.  6. Xanax 0.2 mg daily.  7. Prozac 20 a day.  8. Prevacid 30 a day.  9. Niaspan 500 a day.  10.Mobic p.r.n. for knee pain.   Her daughter unfortunately has a rotator cuff problem after having a  mammogram.  Rachel Conner continues to have some of fatigue, but is otherwise  doing well.   PHYSICAL EXAMINATION:  Exam is remarkable for a weight of 227.  Blood  pressure is 177/73, pulse 68 and regular, respiratory rate 14, afebrile.  HEENT:  Unremarkable.  Carotids are without bruit.  No lymphadenopathy, thyromegaly or JVP  elevation.  LUNGS:  Clear with good diaphragmatic motion.  No wheezing.  S1-S2.  Normal heart sounds.  PMI not palpable.  ABDOMEN:  Protuberant.  Bowel sounds positive.  No AAA.  No tenderness.  No hepatosplenomegaly.  No hepatojugular reflux.  Distal pulse are intact.  No edema.  NEUROLOGIC: Nonfocal.  SKIN:  Warm and dry.  Trace edema.   IMPRESSION:  1. Coronary disease, previous coronary artery bypass graft with I      believe angioplasty of the native right coronary artery.  We will      have to look back through her catheterization records.  Myoview a      year ago was nonischemic.  No chest pain.  Continue aspirin      therapy.  2. Peripheral neuropathy seems to be improved.  No evidence of      peripheral vascular disease.  3. Hyperlipidemia.  Continue Crestor and Niaspan.  Lipid and liver  profile in 6 months.  4. Hypothyroidism.  Continue Synthroid replacement.  TSH and T4 in 6      months.  5. High blood pressure, somewhat suboptimally controlled.  Continue to      attempt to lose weight and low-salt diet.  ACE inhibitor increased      yet again.  Diuretic added.  Follow up in 6 months.     Overall, Rachel Conner is doing well and I will see her in 6 month.     Rachel Conner. Rachel Emms, MD, [facility]  Electronically Signed    PCN/MedQ  DD: 11/06/2007  DT: 11/06/2007  Job #: [id number]

## 2010-11-24 NOTE — Assessment & Plan Note (Signed)
[facility]                            CARDIOLOGY OFFICE NOTE   Rachel Conner, Rachel Conner                     MRN:          [id number]  DATE:09/20/2007                            DOB:          09-22-1935    Rachel Conner returns today for followup.  She has had a previous bypass in  1999.  She has been doing fairly well.  She does not have any  significant chest pain, PND, or orthopnea.   She has been compliant with her medications.  She has had quite a time  of it lately with some arthritis.  She is taking Mobic.  It seems to  affect her knees and feet more.  She appears to be developing a  neuropathy in her legs.   From a cardiac perspective, she is otherwise stable.  In talking to her,  it seems that her blood pressure has been running a little bit high at  home.  She recounts having systolics in the 160-170 range.  I told her  that this was probably too high.  Given her previous history of PAF.  I  think it would be important to get her on better blood pressure control.  Her review of systems is otherwise negative.  Her last stress test was  in September 2008, and it was normal, with an EF 64%.   CURRENT MEDICATIONS:  1. Crestor 10 a day.  2. Synthroid 100 mcg a day.  3. An aspirin a day.  4. Xanax 0.25 a day.  5. Prozac 20 a day.  6. Prevacid 30 a day.  7. Fish oil.  8. Lopressor 50 b.i.d.  9. Lisinopril 20 a day to be added.  10.Niaspan 500 a day.  11.Mobic.  12.Detrol.   PHYSICAL EXAMINATION:  VITAL SIGNS:  Remarkable for a blood pressure of  185/80, pulse 66 and regular, respiratory rate 14, afebrile.  Weight  229.  GENERAL:  Affect appropriate.  HEENT:  Unremarkable.  NECK:  Carotids normal, without bruit.  No lymphadenopathy, thyromegaly,  or JVP elevation.  LUNGS:  Clear.  Good diaphragmatic motion.  No wheezing.  HEART:  S1, S2.  Normal heart sounds.  PMI normal.  ABDOMEN:  Benign.  Bowel sounds positive.  No bruit, no AAA, no  tenderness, no hepatosplenomegaly.  No hepatojugular reflux.  EXTREMITIES:  Distal pulses are intact, with no edema.  NEUROLOGIC:  Nonfocal.  SKIN:  Warm and dry.  MUSCULOSKELETAL:  No muscular weakness.   EKG is totally normal, without LVH.   IMPRESSION:  1. Hypertension. Add lisinopril.  Low-salt diet.  Follow up in 4      weeks.  2. Coronary disease, previous coronary artery bypass graft,      nonischemic Myoview in September 2008, no angina.  Continue aspirin      and beta blocker.  3. Arthritis and pain in legs.  Follow up with orthopedic doctor.      Continue Mobic p.r.n.  4. Hypothyroidism.  Continue Synthroid 100 mcg a day.  TSH and T4 in 6      months.  5. Hyperlipidemia.  Last LDL  cholesterol was around 100.  This was on      January 27, 2007.  LFTs were normal.  Continue to follow.  Consider      increasing Crestor to 20 mg.   I will see her back about 4 weeks to reassess her blood pressure.     Noralyn Pick. Eden Emms, MD, [facility]  Electronically Signed    PCN/MedQ  DD: 09/20/2007  DT: 09/22/2007  Job #: [id number]

## 2010-11-24 NOTE — Assessment & Plan Note (Signed)
[facility]                            CARDIOLOGY OFFICE NOTE   XANA, BRADT                     MRN:          [id number]  DATE:03/30/2007                            DOB:          [birth year]    Rachel Conner returns today for followup.  She is status post previous CABG  in 1999 with a failed graft to the circumflex, stenting of the native OM  in 2003.  She has been doing fairly well.  She is not having any  significant chest pain.  She had an adenosine Myoview study today, which  I have reviewed.  There was some mild breast attenuation, but no  evidence of ischemia, particularly in the inferior lateral wall.  Her EF  was 64%.   Rachel Conner is doing fairly well.  She is active.  She has not had any  significant PND, orthopnea.  She has been compliant with her meds.   REVIEW OF SYSTEMS:  Remarkable for resolution of the rash that she had.  I had sent her to Reuel Boom B. Yetta Barre, M.D. for this and apparently he felt  it was primarily eczema with dry skin.   She has had some chronic pain in her knees that she takes Mobic for.  This seems to be stable.   Her risk factors are fairly well modified.  She tells me her diabetes is  doing well.  Her last LDL cholesterol, I believe, was 90.   She is not having any side effects from her Crestor.   CURRENT MEDICATIONS:  1. An aspirin a day.  2. Xanax 0.25 a day.  3. Prozac 40 a day.  4. Prevacid 30 a day.  5. Fish oil.  6. Synthroid 25 mcg a day.  7. Lopressor 50 b.i.d.  8. Niaspan 500 a day.  9. Lipofen 150 a day.  10.Mobic 15 a day.  11.Crestor 10 a day.   EXAM:  Remarkable for a healthy-appearing elderly white female in no  distress.  Weight is 218, blood pressure 150/70, pulse is 57 and regular,  respiratory rate is 14.  She is afebrile.  HEENT:  Normal.  Carotids are normal without bruit.  There is no lymphadenopathy.  No  thyromegaly.  No JVP elevation.  LUNGS:  Clear with good diaphragmatic  motion.  No wheezing.  There is an S1, S2 with a soft systolic murmur.  PMI is normal.  ABDOMEN:  Benign.  Bowel sounds positive.  No tenderness.  No AAA.  No  hepatosplenomegaly.  No hepatojugular reflux.  Femorals were +2 bilaterally without bruits.  PT are +2.  There is no  lower extremity edema.  NEURO:  Nonfocal.  SKIN:  Warm and dry.  The rash that she had in the upper extremities is  cleared.  There is no muscular weakness.   Her baseline EKG shows normal sinus rhythm with nonspecific ST-T wave  changes.  LVH with strain.   As indicated, reviewed her Myoview study with her, including the actual  images.   IMPRESSION:  1. Coronary artery disease, previous CABG in 1999 with stenting of the  native circumflex in 2003.  Continue aspirin and beta blocker.  She      has new nitroglycerin if she needs it.  I will see her back in 6      months for this.  2. Hypercholesterolemia.  Continue on current medications.  Followup      lipid and liver profile in 6 months.  3. Dermatological rash, resolved.  Continue moisturizers and lotion      per Dr. Karlyn Agee.  4. Diabetes.  Followup hemoglobin A1c in 6 months.  Follow up with her      primary care doctor for this.  I will have to look back through her      medication list, which is in somewhat of a disarray in the chart,      to see if she has been on medication for this or if it has been      diet-controlled.   Overall, I think that Rachel Conner is doing well and I will see her back in  6 months, and was happy to see that her Myoview was nonischemic.     Noralyn Pick. Eden Emms, MD, [facility]  Electronically Signed    PCN/MedQ  DD: 03/30/2007  DT: 03/30/2007  Job #: [id number]

## 2010-11-24 NOTE — Assessment & Plan Note (Signed)
[facility]                            CARDIOLOGY OFFICE NOTE   MILY, MALECKI                     MRN:          [id number]  DATE:05/03/2008                            DOB:          [birth year]    HISTORY:  The patient has known coronary artery disease.  I will have to  look back through her records.  I believe, she had bypass in 1999.   She has significant arthritis particularly in the right knee.  She has  been getting injections by Dr. Fannie Knee.  She may end up eventually needing a  replacement of her right knee.  She has hypertension which is well  controlled.  I have reviewed her lipids with her.  Her LDL is under 80.  Her triglycerides were 176 despite being on TriCor.   I talked to her at length about her diet.  Her problem seemed to be  sweets and bread.   She understands the problem with this and also the likelihood of  developing type 2 diabetes.  Otherwise, she is not having chest pain,  PND, or orthopnea.  There is no significant palpitations or syncope.  She is thinking going to the [place] parade with her sister from  [place].   MEDICATIONS:  1. TriCor 145 a day.  2. Pantoprazole 40 a day.  3. Fluoxetine 20 a day.  4. Lisinopril and hydrochlorothiazide 20/12.5.  5. VESIcare.  6. Crestor 10.   PHYSICAL EXAMINATION:  GENERAL:  Remarkable for an overweight [place]  female in no distress.  Affect is jovial.  VITAL SIGNS:  Weight is 227, blood pressure is 123/58, pulse 66 and  regular, respiratory rate 14, afebrile.  HEENT:  Unremarkable.  NECK:  Carotid is normal without bruit.  No lymphadenopathy,  thyromegaly, or JVP elevation.  LUNGS:  Clear.  Good diaphragmatic motion.  No wheezing.  CARDIAC:  S1 and S2.  Normal heart sounds.  PMI normal.  ABDOMEN:  Benign.  Bowel sounds are positive.  No AAA.  No tenderness.  No bruit.  No hepatosplenomegaly.  No hepatojugular reflux.  EXTREMITIES:  Distal pulses were intact.  No  edema.  NEUROLOGIC:  Nonfocal.  SKIN:  Warm and dry.  No muscular weakness.   IMPRESSION:  1. Coronary artery disease, previous coronary artery bypass grafting      in 1999.  Followup PTCA and stenting of the OM in 2003.  Continue      aspirin therapy.  The patient is not on beta-blockers due to      fatigue.  2. Hypertriglyceridemia.  Continue TriCor, nutrition referral,      decreased fatty food intake.  3. Hypercholesterolemia in the setting of old bypass grafts, primarily      edema being patent.  Continue Crestor.  4. Hypothyroidism.  Continue Synthroid replacement, thyroid-      stimulating hormone, T4 per primary care MD.  5. Hypertension, currently well controlled.  Continue low-sodium diet,      lisinopril and hydrochlorothiazide.  6. History of anxiety, depression, currently very stable.  Continue      fluoxetine 20  a day.  7. History of reflux.  Continue pantoprazole.  Encouraged weight loss,      avoid late-night meals, and recumbency on a full stomach.     Noralyn Pick. Eden Emms, MD, [facility]  Electronically Signed    PCN/MedQ  DD: 05/03/2008  DT: 05/04/2008  Job #: [id number]

## 2010-11-24 NOTE — Assessment & Plan Note (Signed)
[facility]                            CARDIOLOGY OFFICE NOTE   CAMARIE, MCTIGUE                     MRN:          [id number]  DATE:12/12/2006                            DOB:          [birth year]    Udell returns today for followup.  She is status post previous CABG  with a failed graft to the circumflex and angioplasty to the native  circumflex.   She is due to have a followup Myoview in October.  She denies any  significant chest pain.   In regards to her coronary artery disease, she has been fairly active.  She has not had to use any nitroglycerin.   She has been compliant with her meds.   She has hypercholesterolemia.  Her LDLs have been above 100 on 40 of  Zocor.  She is also on Lipofen and Niaspan.   I have told her we would switch her to Crestor.   In regards to her blood pressure, it is well controlled on Lopressor.   The patient's review of systems is remarkable for a rash.  She has had  it for about 7 weeks.  It started under her breast.  She needs  dermatology followup.  Review of systems is otherwise negative.   She is currently taking an aspirin once a day, Xanax 0.25, Prozac 20 a  day, Prevacid 30 a day, Zocor 40 a day, Synthroid 0.25 mg, Lopressor 50  b.i.d., Niaspan 500 a day, Lipofen 150 a day.   On exam, she is healthy-appearing middle-aged white female in no  distress.  Affect is appropriate.  Blood pressure is 150/80, pulse 73 and regular.  Respiratory rate is 14.  She is afebrile.  HEENT:  Normal.  There is no lymphadenopathy, no thyromegaly, no JVP elevation, no  bruits.  LUNGS:  Clear with normal diaphragmatic motion.  There is an S1, S2 with normal heart sounds.  PMI is not palpable.  ABDOMEN:  Benign.  There is no AAA, no tenderness, bowel sounds are  positive, no hepatosplenomegaly, no hepatojugular reflux.  Femorals are +3 bilaterally without bruit.  PTs are palpable  bilaterally.  There is no lower  extremity edema or varicosities.  NEURO EXAM:  Nonfocal.  There is no muscular weakness.  Skin exam has a faint, erythematous rash under the breast.  Apparently,  she has had scrapings and this is not yeast.   IMPRESSION:  1. Stable coronary artery bypass surgery.  Continue aspirin and beta      blocker.  2. Failed grafts with native stenting of the circumflex artery.      Continue on aspirin therapy.  Followup Myoview in October.  3. High blood pressure, well controlled on Lopressor b.i.d.  4. Hypercholesterolemia, suboptimally controlled on Zocor, switch to      Crestor 10 a day, followup lipid and liver profile in 3 months.  5. Rash, to followup with [facility].  6. Hypothyroidism, continue Synthroid at current dose, followup TSH      and T4 in 6 months.   Overall, the patient's cardiac status appears stable so long as she  has  a __________ Celine Ahr in October, we will continue to treat her  medically.  Her EKG today was totally normal.     Peter C. Eden Emms, MD, [facility]  Electronically Signed    PCN/MedQ  DD: 12/12/2006  DT: 12/12/2006  Job #: [id number]

## 2010-11-24 NOTE — Assessment & Plan Note (Signed)
[facility]                            CARDIOLOGY OFFICE NOTE   Rachel Conner, Rachel Conner                     MRN:          [id number]  DATE:02/22/2007                            DOB:          [birth year]    This is a patient of Dr. Eden Emms.   This is a 75 year old married white female patient of Dr. Eden Emms, who  has a history of coronary artery disease status post previous CABG x6 in  1999 with a LIMA to the LAD, SVG to the diagonal and OM, SVG to the  diagonal 1 and 2, and SVG to the RCA.  Catheterization in 2000 showed  occlusion of SVG to the RCA and SVG to the OM.  She also had a subtotal  mid-RCA suggesting thrombus and underwent stenting x2 by Dr. Juanda Chance.  Her last catheterization in 2003 she had successful stenting of the  native circumflex marginal by Dr. Juanda Chance.  that was in April 2003.  Her  last catheterization in September 2003, she had moderate in-stent  restenosis of the OM branch with a normal FFR.  otherwise her anatomy  remained unchanged and medical therapy was recommended.   She last saw Dr. Eden Emms in June of this year and was doing fine.  Over  the past month she has developed a sharp, shooting chest pain under her  right breast that radiates around her breast.  She says it occurs at any  time of day and does not make her catch her breath or does she become  short of breath.  She mentioned it to her nurse practitioner in [place],  who recommended coming in for a visit.  She has no heaviness, pressure,  tightness, arm pain, dizziness, palpitations or presyncope.  She cannot  exercise due to knee problems but she does a fair amount of gardening  and has no trouble with this.  She is scheduled to have an adenosine  Myoview in October of this year.   CURRENT MEDICATIONS:  1. Aspirin 325 mg daily.  2. Xanax 0.25 mg daily.  3. Prozac 20 mg daily.  4. Prevacid 30 mg daily.  5. Vitamin E 400 international units daily.  6. Fish oil 1000  mg daily.  7. Synthroid 0.25 mg daily.  8. Lopressor 50 mg b.i.d.  9. Multivitamin daily.  10.Niaspan 500 mg daily.  11.Lipofen 150 mg daily.  12.Mobic 15 mg daily.  13.Detrol 4 mg daily.  14.Crestor 10 mg daily.   PHYSICAL EXAMINATION:  This is a very pleasant 75 year old white female  in no acute distress.  Blood pressure is 170/69, pulse 67, weight 218.  NECK:  Without JVD, HJR, bruit or thyroid enlargement.  LUNGS:  Clear anterior, posterior and lateral.  HEART:  Regular rate and rhythm at 67 beats per minute, normal S1 and  S2.  Distant heart sounds.  No murmur, rub, bruit, thrill or heave  noted.  ABDOMEN:  Obese, normoactive bowel sounds heard throughout.  No  organomegaly, masses, lesions or abnormal tenderness.  EXTREMITIES:  Without cyanosis, clubbing, edema.  She has good distal  pulses.   EKG:  Normal sinus rhythm, poor R-wave progression, no acute change from  prior tracing.   IMPRESSION:  1. Sharp, shooting chest pain, somewhat atypical, but has had atypical      symptoms in the past.  2. Coronary artery disease, status post coronary artery bypass graft      x6 in 1999 with grafts as listed above.  3. Status post stenting of the right coronary artery x2 in 2002.  4. Status post stenting of the native circumflex in April 2003.  5. Last catheterization in September 2003 showed in-stent restenosis      of her obtuse marginal with normal FFR, medical therapy      recommended.  Ejection fraction appeared preserved, hampered by      ventricular ectopy.  6. Hypertension.  7. Hypercholesterolemia.  8. Hypothyroidism.   PLAN AT THIS TIME:  Her chest pain is somewhat atypical, but she has had  atypical symptoms in the past.  Because of this, I will move up her  adenosine Myoview and have her see Dr. Eden Emms back in follow-up,  hopefully on the same day.      Jacolyn Reedy, PA-C  Electronically Signed      Doylene Canning. Ladona Ridgel, MD  Electronically Signed    ML/MedQ  DD: 02/22/2007  DT: 02/23/2007  Job #: [id number]

## 2010-11-27 NOTE — Cardiovascular Report (Signed)
[place]  Patient:    Rachel Conner, Rachel Conner Visit Number: [id number] MRN: [id number]          Service Type: MED Location: [phone] Attending Physician:  Nathen May Dictated by:   Noralyn Pick Eden Emms, M.D. [facility] Proc. Date: 10/12/01 Admit Date:  10/11/2001 Discharge Date: 10/13/2001                          Cardiac Catheterization  PROCEDURE PERFORMED: Coronary arteriography.  INDICATIONS: Unstable angina, status post CABG.  CORONARY ARTERIOGRAPHY: The left main coronary artery had 20% discrete stenosis.  The left anterior descending artery was subtotally occluded in the mid vessel. First diagonal branch had 20-30% multiple discrete lesions.  The circumflex coronary artery had an occluded first obtuse marginal branch. The second obtuse marginal branch had a 60-70% discrete lesion in the mid vessel.  Right coronary artery had 20-30% multiple discrete lesions in the proximal and midportion. There was a 40% discrete lesion distally.  The internal mammary artery of the LAD was widely patent.  The saphenous vein graft to the first obtuse marginal branch and AV groove branch was widely patent. There was retrograde filling from the AV groove branch to the second obtuse marginal branch. The saphenous vein graft to the intermediate and to the right coronary artery were known to be occluded.  RIGHT ANTERIOR OBLIQUE VENTRICULOGRAPHY: RAO ventriculography revealed inferior basilar wall hypokinesis. EF was 55-60%. No gradient across the aortic valve and no MR.  IMPRESSION: Films were reviewed with Dr. Juanda Chance. The patient has been having anginal symptoms. The only area that could be causing this would be the obtuse marginal branch. It does get some retrograde filling from the AV groove through the vein graft. However, this would not be a difficult lesion to treat percutaneously and we will proceed with this.  The patient will get 600 mg of  p.o. Plavix and be started on heparin and then be done with Dr. Charlies Constable as his next case. Dictated by:   Noralyn Pick Eden Emms, M.D. [facility] Attending Physician:  Nathen May DD:  10/12/01 TD:  10/12/01 Job: [id number]

## 2010-11-27 NOTE — Discharge Summary (Signed)
[place]. [facility]  Patient:    Rachel Conner, Rachel Conner Visit Number: [id number] MRN: [id number]          Service Type: MED Location: [phone] Attending Physician:  Rachel Conner Dictated by:   Rachel Conner, P.A.-C. Admit Date:  10/11/2001 Disc. Date: 10/13/01   CC:         Rachel Conner, N.P.   Referring Physician Discharge [place]  DATE OF BIRTH:  02-13-1936  ADMITTING PHYSICIAN:  Rachel Conner, M.D.  SUMMARY OF HISTORY:  Rachel Conner is a 75 year old white female who presented to the office on October 11, 2001 with a one-week history of progressive chest discomfort.  She feels that it is reminiscent of her stenting although not as severe.  She describes recurrent brief episodes as squeezing associated with radiation to the jaw and mild nausea, occurring with rest and exertion.  She thinks that there has been an increase in the frequency and intensity over the past week.  Her history is notable for bypass surgery preceded by myocardial infarction.  She had a six-vessel bypass in July 1999 with a LIMA to the LAD, saphenous vein graft to the diagonal and OM, saphenous vein graft to the diagonal one and diagonal two, and saphenous vein graft to the RCA.  Last catheterization in January 2000 showed occlusion of the saphenous vein graft to the RCA and the saphenous vein graft to the OM.  There was also subtotal occlusion in the mid RCA suggesting thrombus.  She underwent stenting of the RCA x2 by Dr. Juanda Conner and has done well since that time.  Her annual stress test - the last one being in Conner 2000 - did not reveal any perfusion abnormalities; EF was 62%.  Her other history is notable for partial thyroidectomy, COPD, obesity, hypothyroidism, dislipidemia, remote tobacco.  LABORATORY DATA:  TSH 2.489.  CKs and troponins were negative for myocardial infarction.  Admission sodium 139, potassium 3.6, BUN 17, creatinine 0.9, albumin was slightly low  at 3.4, ALT was slightly elevated at 47, alk phos at 35 was slightly low.  PT 13.6, PTT 32.  H&H 12.8 and 37.4, normal indices, platelets 278, wbcs 7.3.  Subsequent hematologies were unremarkable.  Lipids are pending at the time of this dictation.  EKG showed normal sinus rhythm, first degree AV block, delayed R wave progression.  Post procedure hematology and chemistry were unremarkable.  Chest x-ray showed no acute processes.  HOSPITAL COURSE:  Rachel Conner was admitted to the hospital.  She underwent cardiac catheterization by Dr. Eden Conner on April 3.  She had a 20% left main, 20% diagonal one with competitive flow, 100% OM-1, 50-60% OM-2, 20% proximal RCA, 30% mid RCA, 40% distal RCA.  The saphenous vein graft to the OM-1 and AV were patent, LIMA to the LAD was patent.  EF was 55-60%, inferobasilar hypokinesis.  Dr. Eden Conner felt angioplasty to the OM was in order.  This was reviewed by Dr. Juanda Conner and Dr. Juanda Conner performed angioplasty, reducing the OM lesion from 80% to less than 10%.  Post sheath removal and bedrest she was ambulating the hall without difficulty, catheterization site was intact; thus, Dr. Eden Conner discharged home.  DISCHARGE DIAGNOSES:  1. Unstable angina.  2. Progressive coronary artery disease status post angioplasty as previously     described.  3. History as previously.  DISPOSITION:  She is discharged home.  MEDICATIONS:  Her medications remain unchanged.  This include:  1. Aspirin 325 q.d.  2.  Xanax 0.25 b.i.d.  3. Prozac 20 mg q.d.  4. Prevacid 30 mg q.d.  5. Lopressor 50 mg one-half tablet b.i.d.  6. Tricor 200 mg q.d.  7. Zocor 40 mg q.h.s.  8. Flonase 50 mcg as needed.  9. Vitamin E 400 IU q.d. 10. Multivitamin. 11. Fish oil. 12. Synthroid 0.05 mg q.d. 13. Sublingual nitroglycerin as needed.  ACTIVITY:  No lifting, driving, sexual activity, or heavy exertion for two days.  DIET:  Maintain low salt/fat/cholesterol diet.  WOUND CARE:  If she had  any problems with her catheterization site she was asked to call immediately.  FOLLOW-UP:  She will see Dr. Eden Conner on Conner 8, 2003 at 9:45 a.m.  NOTE:  It is noted that lipids are pending at the time of this dictation. Dictated by:   Rachel Conner, P.A.-C. Attending Physician:  Rachel Conner DD:  10/13/01 TD:  10/13/01 Job: [id number]

## 2010-11-27 NOTE — Cardiovascular Report (Signed)
Rachel Conner, Rachel Conner                          ACCOUNT NO.:  [id number]   MEDICAL RECORD NO.:  [id number]                   PATIENT TYPE:  OIB   LOCATION:  2899                                 FACILITY:  [facility]   PHYSICIAN:  Salvadore Farber, M.D. [facility]         DATE OF BIRTH:  October 06, 1935   DATE OF PROCEDURE:  03/29/2002  DATE OF DISCHARGE:  03/29/2002                              CARDIAC CATHETERIZATION   PROCEDURE:  1. Left heart catheterization.  2. Left ventriculography.  3. Selective coronary angiography.  4. Saphenous vein graft angiography x2.  5. Left subclavian angiography.  6. Left internal mammary artery angiography.  7. Flow wire measurement of obtuse marginal branch.   INDICATIONS FOR PROCEDURE:  The patient is a 75 year old lady status post  coronary artery bypass grafting including LIMA to her LAD, sequential and  saphenous vein graft to diagonal and OM, sequential saphenous vein graft to  acute marginal and distal right coronary, and a sequential saphenous vein  graft to other diagonal branches.  Prior catheterization has demonstrated  occlusion of the vein graft to the right and of the vein graft to the  diagonals.  The sequential vein graft to the diagonal and OM has previously  been opened as has been the LIMA.  She has previously undergone stenting of  her proximal right coronary artery and the sole OM supplied via her native  left circulation.  She now presents with recurrent angina and an exercise  test suggesting apical ischemia.   DIAGNOSTIC TECHNIQUE:  Informed consent was obtained.  Under 2% lidocaine  local anesthesia, a #6 French sheath was placed in the right femoral artery  using the modified Seldinger technique.  Native coronaries were imaged using  JL4 and JR4 catheters.  The JR4 catheter was then used to selectively engage  the saphenous vein graft to the diagonals as well as the saphenous vein  graft to the diagonal and OM.  The saphenous vein  graft to the RCA was  previously known to be occluded and was not engaged.  The JR4 catheter was  then pulled back and used to engage the left subclavian artery.  Left  subclavian angiography was performed by hand injection.  The catheter was  then exchanged over a wire to a LIMA catheter which was selectively engaged  in the LIMA.  Angiography was performed by hand injection.  A pigtail  catheter was then advanced into the left ventricle.  Pressures were  measured.  Ventriculography was performed by power injection.   There was moderate in-stent restenosis of the obtuse marginal branch.  To  further interrogate this, a pressure wire was employed.  Anticoagulation was  instituted with 5000 units of heparin.  An XB 3.5 guide was advanced over  wire and engaged in the left main coronary artery.  A pressure wire was  normalized in the proximal vessel and then advanced across the lesion.  An  FFR  minimized at 0.92 after the administration of 40 mcg of intracoronary  adenosine.   ANGIOGRAPHIC FINDINGS:  1. Left main:  Angiographically normal.  2. LAD:  There was a moderate stenosis of the mid vessel which is not well     visualized due to competitive flow between a native vessel and the LIMA.     There is an 80% stenosis of the proximal portion of an unbypassed     diagonal branch which is approximately 1 mm in size.  The other diagonal     branches are occluded at their ostia.  The LIMA graft to the mid LAD is     widely patent with normal anastomosis.  3. Circumflex:  There is 50% in-stent restenosis of the OM stent.  Other     obtuse marginals are occluded at their ostia.  The saphenous vein graft     to diagonal and OM is widely patent with normal anastomoses.  4. RCA:  The proximal RCA stent is widely patent.  The saphenous vein graft     to the RCA is occluded at its ostium.  There is a remnant of this graft     connecting the acute marginal and the distal right coronary.  5. Pressure  wire interrogation of the native OM lesion demonstrated an FFR     minimum of 0.92 after the administration of 40 mcg of intracoronary     adenosine.  6. LV:  EF appeared preserved.  EF calculation hampered by ventricular     ectopy.  7. No mitral regurgitation.  8. No aortic stenosis.   IMPRESSION/PLAN:  The patient has moderate in-stent restenosis of her obtuse  marginal branch with a normal FFR.  Otherwise, her anatomy is unchanged  compared with the study of April 2003.  Will plan on continued medical  therapy.                                                Salvadore Farber, M.D. [facility]    WED/MEDQ  D:  03/29/2002  T:  03/29/2002  Job:  [id number]   cc:   Autumn Patty, M.D.   Noralyn Pick. Eden Emms, M.D. [facility]

## 2010-11-27 NOTE — Cardiovascular Report (Signed)
[place]. [facility]  Patient:    Rachel Conner, Rachel Conner Visit Number: [id number] MRN: [id number]          Service Type: MED Location: [phone] Attending Physician:  Nathen May Dictated by:   Everardo Beals Juanda Chance, M.D. [facility] Proc. Date: 10/12/01 Admit Date:  10/11/2001 Discharge Date: 10/13/2001   CC:         Theron Arista C. Eden Emms, M.D. [facility]  Cardiopulmonary Laboratory   Cardiac Catheterization  PROCEDURES PERFORMED: Percutaneous coronary intervention.  CLINICAL HISTORY: The patient is 75 years old and has had previous bypass surgery and has had subsequent stenting of the right coronary artery x2 in 2002. She developed recent angina and was studied by Dr. Eden Emms today and found to have a patent LIMA to the LAD, a patent vein graft to the diagonal and posterolateral branch of the circumflex artery. The old occluded vein graft to the right coronary artery and old occluded vein graft to the diagonal branch of the LAD. A marginal vessel, which was not grafted had an 80% lesion and Dr. Eden Emms and I felt that intervention was indicated.  DESCRIPTION OF PROCEDURE: The procedure was performed via the right femoral artery using an arterial sheath and a 7 French 3.5 guiding catheter with side holes. The patient was given weight-adjusted heparin to prolong the ACT to approximately 300 seconds and had been given 600 mg of Plavix about an hour prior to the procedure. She did not receive Integrilin. We used a BMW wire and crossed the lesion in the circumflex marginal vessel with the wire without difficulty. We used a 3.5 Voda, guiding catheter with side holes, 7 [place]. We direct stented with a 2.25 x 12 mm Express performing one inflation of 10 atmospheres for 30 seconds, and a second inflation of 16 atmospheres for 30 seconds with the balloon pulled inside the distal edge. Repeat diagnostic studies were then performed through the guiding catheter. The patient  tolerated the procedure well and left the laboratory in satisfactory condition.  RESULTS: Initially, the stenosis in the circumflex marginal vessel was estimated at 80%. Following stenting, this improved to 0%.  CONCLUSIONS: Successful stenting of the native circumflex marginal vessel with improvement in percent diameter narrowing from 80% to less than 10%.  DISPOSITION: The patient was returned to the postangioplasty unit for further observation. Dictated by:   Everardo Beals Juanda Chance, M.D. [facility] Attending Physician:  Nathen May DD:  10/12/01 TD:  10/12/01 Job: [id number]

## 2010-11-27 NOTE — Assessment & Plan Note (Signed)
[facility]                            CARDIOLOGY OFFICE NOTE   ELIESE, KERWOOD                     MRN:          [id number]  DATE:09/15/2006                            DOB:          1935-12-21    Ms. Lembo returns today for follow up.  She is status post CABG with  failed grafts and intervention of the native circumflex.  She has been  doing well.  Her cholesterol is suboptimally controlled.  She had  previously been on Tricor and Zocor.  We will switch her to Niaspan 500,  Lipofen 150 and Lipitor 80.  I told her that we would start off with  Lipitor 40 and escalate this to 80 after 4 weeks.  She will get her  liver tested in about 8 weeks.  She has not had any significant chest  pain, PND or orthopnea.  She had been compliant with her other  medications.   She continues to need to lose weight.  Her bypass surgery was in 1999.   REVIEW OF SYSTEMS:  Otherwise negative.   PHYSICAL EXAMINATION:  VITAL SIGNS:  On exam, blood pressure was 120/60,  pulse 72 and regular.  HEENT:  Normal.  NECK:  Carotids normal without bruit.  There was no thyromegaly.  LUNGS:  Clear.  HEART:  S1 and S2 with normal heart sounds.  ABDOMEN:  Benign.  There is no hepatosplenomegaly.  Distal pulses are  intact with no edema.  NEUROLOGIC:  Nonfocal.  SKIN:  Warm and dry.   MEDICATIONS:  1. An aspirin a day.  2. Xanax 0.25 daily.  3. Prozac 20 daily.  4. Prevacid 30 a day.  5. Lipofen 150.  6. Niaspan 500 long acting.  7. Lipitor 80.  8. Synthroid 25 mcg.  9. Lopressor 50 b.i.d.   IMPRESSION:  Stable coronary artery disease.  Coronary artery bypass  graft in 1999 with failed grafts and intervention with stenting of the  native circumflex.  Continue risk factor modification.  Blood pressure  and sugar under good control.  Alternating lipid management as above.  Follow up LFT's in 8 weeks.  The patient will have a follow up stress  test in August of this  year.   She will call us if she has any significant muscle weakness or pain.  Overall, I am pleased with her progress.     Noralyn Pick. Eden Emms, MD, [facility]  Electronically Signed    PCN/MedQ  DD: 09/15/2006  DT: 09/15/2006  Job #: [id number]

## 2010-11-27 NOTE — Assessment & Plan Note (Signed)
[facility]                              CARDIOLOGY OFFICE NOTE   SHEKIA, KUPER                     MRN:          [id number]  DATE:03/11/2006                            DOB:          01-Jan-1936    Ms. Fettes returns today for followup.  She is status post CABG in 1999.  She  has had occlusion of her vein graft to the circ with stenting.  She has been  doing fairly well.  She has increasing problems with her arthritis and some  paresthesias in her lower extremities.  We checked her for diabetes last  time and her hemoglobin A1c and fasting sugars were normal.  She will follow  up with her primary care M.D. for this.   The patient had a Myoview study today which was nonischemic with an EF of  72%.  Her risk factor modification is good.  She had her granddaughter  Loistine Simas with her today.   ON EXAM:  VITAL SIGNS:  Blood pressure 130/54.  Pulse 72 and regular.  LUNGS:  Clear.  CAROTIDS:  Normal.  CARDIAC:  Normal S1, S2.  Normal heart sounds.  ABDOMEN:  Benign.  EXTREMITIES:  No edema.   IMPRESSION:  Stable, status post CABG, good with respect to modification.  Continue with Tricor and Zocor.  The patient is on a beta-blocker.   She is not having angina.  She carries nitroglycerin with her when she  travels.   I am encouraged by her nonischemic Myoview and will see her back in six  months.                               Noralyn Pick. Eden Emms, MD, [facility]    PCN/MedQ  DD:  03/11/2006  DT:  03/12/2006  Job #:  [id number]

## 2011-01-21 ENCOUNTER — Encounter: Payer: Self-pay | Admitting: Cardiovascular Disease

## 2011-01-22 ENCOUNTER — Encounter: Payer: Self-pay | Admitting: Internal Medicine

## 2011-01-25 ENCOUNTER — Telehealth: Payer: Self-pay | Admitting: Cardiovascular Disease

## 2011-01-25 MED ORDER — LOSARTAN POTASSIUM 50 MG PO TABS: 50.0000 mg | ORAL_TABLET | Freq: Every day | ORAL | Status: DC

## 2011-01-25 NOTE — Telephone Encounter (Signed)
losartin 50 mg refill, uses [facility], wants generic if possible

## 2011-04-22 ENCOUNTER — Encounter: Payer: Self-pay | Admitting: Cardiovascular Disease

## 2011-04-22 ENCOUNTER — Ambulatory Visit (INDEPENDENT_AMBULATORY_CARE_PROVIDER_SITE_OTHER): Payer: Medicare Other | Admitting: Cardiovascular Disease

## 2011-04-22 DIAGNOSIS — E039 Hypothyroidism, unspecified: Secondary | ICD-10-CM

## 2011-04-22 DIAGNOSIS — E7800 Pure hypercholesterolemia, unspecified: Secondary | ICD-10-CM

## 2011-04-22 DIAGNOSIS — I2510 Atherosclerotic heart disease of native coronary artery without angina pectoris: Secondary | ICD-10-CM

## 2011-04-22 DIAGNOSIS — I10 Essential (primary) hypertension: Secondary | ICD-10-CM

## 2011-04-22 LAB — TSH: TSH: 1.75 u[IU]/mL (ref 0.35–5.50)
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
## 2011-04-22 NOTE — Assessment & Plan Note (Signed)
LDL 74 with normal LFTS 7/12 Continue statin

## 2011-04-22 NOTE — Patient Instructions (Signed)
Your physician wants you to follow-up in: 6 MONTHS You will receive a reminder letter in the mail two months in advance. If you don't receive a letter, please call our office to schedule the follow-up appointment. 

## 2011-04-22 NOTE — Assessment & Plan Note (Signed)
Reviewed labs from [facility] 7/12 no TSH done will check today

## 2011-04-22 NOTE — Assessment & Plan Note (Signed)
Stable with no angina and good activity level.  Continue medical Rx  

## 2011-11-19 ENCOUNTER — Ambulatory Visit (INDEPENDENT_AMBULATORY_CARE_PROVIDER_SITE_OTHER): Payer: Medicare Other | Admitting: Cardiovascular Disease

## 2011-11-19 ENCOUNTER — Encounter: Payer: Self-pay | Admitting: Cardiovascular Disease

## 2011-11-19 VITALS — BP 150/80 | HR 68 | Wt 226.0 lb

## 2011-11-19 DIAGNOSIS — I2510 Atherosclerotic heart disease of native coronary artery without angina pectoris: Secondary | ICD-10-CM

## 2011-11-19 DIAGNOSIS — E039 Hypothyroidism, unspecified: Secondary | ICD-10-CM

## 2011-11-19 DIAGNOSIS — I119 Hypertensive heart disease without heart failure: Secondary | ICD-10-CM

## 2011-11-19 DIAGNOSIS — E7800 Pure hypercholesterolemia, unspecified: Secondary | ICD-10-CM

## 2011-11-19 LAB — T4, FREE: Free T4: 0.87 ng/dL (ref 0.60–1.60)

## 2011-11-19 LAB — TSH: TSH: 1.24 u[IU]/mL (ref 0.35–5.50)

## 2011-11-19 NOTE — Assessment & Plan Note (Signed)
Check TSH/T4 today  F/U primary

## 2011-11-19 NOTE — Progress Notes (Signed)
Patient ID: Rachel Conner, female   DOB: Jul 28, 1935, 76 y.o.   MRN: [id number] Rachel Conner is seen today in followup for history of coronary artery disease and bypass surgery. I believe her bypass was in 99. He subsequently had a stenting of the native RCA she has an occluded vein graft to the right. She also has a stent in the native obtuse marginal branch. Her last catheter was in 2003. Her last Myoview in 2008 was nonischemic.her ejection fraction 64% at that time. She is not having t any significant chest pain. She has had a persistant dry cough with lisinipril We will switch her to Cozaar. She continues to need to work on her diet and carb intake. He BS has been ok as has her cholesterol as checked by her primary She had succesful gallbladder surgery in March. She had her F/U colonoscopy and there were no issues.   ROS: Denies fever, malais, weight loss, blurry vision, decreased visual acuity, cough, sputum, SOB, hemoptysis, pleuritic pain, palpitaitons, heartburn, abdominal pain, melena, lower extremity edema, claudication, or rash.  All other systems reviewed and negative  General: Affect appropriate Healthy:  appears stated age HEENT: normal Neck supple with no adenopathy JVP normal no bruits no thyromegaly Lungs clear with no wheezing and good diaphragmatic motion Heart:  S1/S2 no murmur, no rub, gallop or click PMI normal Abdomen: benighn, BS positve, no tenderness, no AAA no bruit.  No HSM or HJR Distal pulses intact with no bruits No edema Neuro non-focal Skin warm and dry No muscular weakness   Current Outpatient Prescriptions  Medication Sig Dispense Refill  . ALPRAZolam (XANAX) 0.5 MG tablet Take 0.5 mg by mouth at bedtime as needed.        Marland Kitchen aspirin 325 MG tablet Take 325 mg by mouth daily.        . fenofibrate (TRICOR) 145 MG tablet Take 145 mg by mouth daily.        . fish oil-omega-3 fatty acids 1000 MG capsule Take 1 g by mouth daily.        Marland Kitchen FLUoxetine (PROZAC) 20 MG  capsule Take 20 mg by mouth daily.        Marland Kitchen levothyroxine (SYNTHROID, LEVOTHROID) 100 MCG tablet Take 100 mcg by mouth daily.        Marland Kitchen losartan (COZAAR) 50 MG tablet Take 1 tablet (50 mg total) by mouth daily.  90 tablet  3  . metoprolol (LOPRESSOR) 50 MG tablet Take 50 mg by mouth 2 (two) times daily.        . niacin (NIASPAN) 500 MG CR tablet Take 500 mg by mouth at bedtime.        Marland Kitchen oxybutynin (DITROPAN) 5 MG tablet 3 tabs po qd      . pantoprazole (PROTONIX) 40 MG tablet Take 40 mg by mouth daily.        . rosuvastatin (CRESTOR) 20 MG tablet Take 20 mg by mouth daily.        . vitamin C (ASCORBIC ACID) 500 MG tablet Take 500 mg by mouth daily.        Marland Kitchen VITAMIN D, CHOLECALCIFEROL, PO Take 1 tablet by mouth daily.        . vitamin E 400 UNIT capsule Take 1,000 Units by mouth daily.         Allergies  Nitroglycerin  Electrocardiogram:  Assessment and Plan

## 2011-11-19 NOTE — Assessment & Plan Note (Signed)
Stable with no angina and good activity level.  Continue medical Rx  

## 2011-11-19 NOTE — Assessment & Plan Note (Signed)
Well controlled.  Continue current medications and low sodium Dash type diet.    

## 2011-11-19 NOTE — Patient Instructions (Signed)
Your physician wants you to follow-up in:  6 MONTHS WITH DR NISHAN  You will receive a reminder letter in the mail two months in advance. If you don't receive a letter, please call our office to schedule the follow-up appointment. Your physician recommends that you continue on your current medications as directed. Please refer to the Current Medication list given to you today. 

## 2011-11-19 NOTE — Assessment & Plan Note (Signed)
Cholesterol is at goal.  Continue current dose of statin and diet Rx.  No myalgias or side effects.  F/U  LFT's in 6 months. No results found for this basename: LDLCALC             

## 2012-01-24 ENCOUNTER — Telehealth: Payer: Self-pay | Admitting: Cardiovascular Disease

## 2012-01-24 NOTE — Telephone Encounter (Signed)
New msg Pt has been taking niaspan for years.  She had a reaction to this med. She had rash and took benedryl and it went away.

## 2012-01-24 NOTE — Telephone Encounter (Signed)
Patient has been taking Niaspan for years and Saturday night she took it and about 45 minutes later she had the worst flushing she has ever had.  She also had red places and felt it was more like an allergic reaction and thinks secondary to Niaspan.  Patient does take ASA prior to Niaspan. She took some Benedryl and all went away in about 30 minutes.  She did not take Niaspan last night and does not want to take until she gets advise from Dr Eden Emms.  Will forward to Dr Eden Emms for review. Patient aware will not get call back today

## 2012-01-25 NOTE — Telephone Encounter (Signed)
Ok to stop niaspan

## 2012-01-25 NOTE — Telephone Encounter (Signed)
Advised patient

## 2012-02-18 ENCOUNTER — Other Ambulatory Visit: Payer: Self-pay | Admitting: Cardiovascular Disease

## 2012-02-18 NOTE — Telephone Encounter (Signed)
Fax Received. Refill Completed. Rachel Conner (R.M.A)   

## 2012-07-26 ENCOUNTER — Telehealth: Payer: Self-pay | Admitting: Cardiovascular Disease

## 2012-07-26 DIAGNOSIS — E7800 Pure hypercholesterolemia, unspecified: Secondary | ICD-10-CM

## 2012-07-26 NOTE — Telephone Encounter (Signed)
Spoke to patient she stated she has appointment with Dr.Nishan 08/09/12 and would like cholesterol checked.Patient was told to fast before appointment and a lipid,hepatic panels can be done.

## 2012-07-26 NOTE — Telephone Encounter (Signed)
New Problem:    Patient called in wanting to have labs drawn for the cholesterol medication that she is taking.  Please call back.

## 2012-08-09 ENCOUNTER — Other Ambulatory Visit: Payer: Medicare Other

## 2012-08-09 ENCOUNTER — Ambulatory Visit: Payer: Medicare Other | Admitting: Cardiovascular Disease

## 2012-09-06 ENCOUNTER — Ambulatory Visit (INDEPENDENT_AMBULATORY_CARE_PROVIDER_SITE_OTHER): Payer: Medicare Other | Admitting: Cardiovascular Disease

## 2012-09-06 ENCOUNTER — Other Ambulatory Visit (INDEPENDENT_AMBULATORY_CARE_PROVIDER_SITE_OTHER): Payer: Medicare Other

## 2012-09-06 ENCOUNTER — Encounter: Payer: Self-pay | Admitting: Cardiovascular Disease

## 2012-09-06 VITALS — BP 142/80 | HR 67 | Ht 65.0 in | Wt 227.0 lb

## 2012-09-06 LAB — HEPATIC FUNCTION PANEL
ALT: 23 U/L (ref 0–35)
Alkaline Phosphatase: 28 U/L — ABNORMAL LOW (ref 39–117)
Bilirubin, Direct: 0.1 mg/dL (ref 0.0–0.3)
Total Bilirubin: 0.6 mg/dL (ref 0.3–1.2)
Total Protein: 6.8 g/dL (ref 6.0–8.3)

## 2012-09-06 LAB — LIPID PANEL
Cholesterol: 127 mg/dL (ref 0–200)
VLDL: 22.6 mg/dL (ref 0.0–40.0)

## 2012-09-06 NOTE — Assessment & Plan Note (Signed)
Stable with no angina and good activity level.  Continue medical Rx  

## 2012-09-06 NOTE — Patient Instructions (Signed)
Your physician wants you to follow-up in:  6 MONTHS WITH DR NISHAN  You will receive a reminder letter in the mail two months in advance. If you don't receive a letter, please call our office to schedule the follow-up appointment. Your physician recommends that you continue on your current medications as directed. Please refer to the Current Medication list given to you today. 

## 2012-09-06 NOTE — Progress Notes (Signed)
Patient ID: Rachel Conner, female   DOB: 1936-02-10, 77 y.o.   MRN: [id number] Rachel Conner is seen today in followup for history of coronary artery disease and bypass surgery. I believe her bypass was in 99. He subsequently had a stenting of the native RCA she has an occluded vein graft to the right. She also has a stent in the native obtuse marginal branch. Her last catheter was in 2003. Her last Myoview in 2008 was nonischemic.her ejection fraction 64% at that time. She is not having t any significant chest pain. She has had a persistant dry cough with lisinipril We will switch her to Cozaar. She continues to need to work on her diet and carb intake. He BS has been ok as has her cholesterol as checked by her primary She had succesful gallbladder surgery in March. She had her F/U colonoscopy and there were no issues.  ROS: Denies fever, malais, weight loss, blurry vision, decreased visual acuity, cough, sputum, SOB, hemoptysis, pleuritic pain, palpitaitons, heartburn, abdominal pain, melena, lower extremity edema, claudication, or rash.  All other systems reviewed and negative  General: Affect appropriate Healthy:  appears stated age HEENT: normal Neck supple with no adenopathy JVP normal no bruits no thyromegaly Lungs clear with no wheezing and good diaphragmatic motion Heart:  S1/S2 no murmur, no rub, gallop or click PMI normal Abdomen: benighn, BS positve, no tenderness, no AAA no bruit.  No HSM or HJR Distal pulses intact with no bruits No edema Neuro non-focal Skin warm and dry No muscular weakness   Current Outpatient Prescriptions  Medication Sig Dispense Refill  . ALPRAZolam (XANAX) 0.5 MG tablet Take 0.5 mg by mouth at bedtime as needed.        Marland Kitchen aspirin 325 MG tablet Take 325 mg by mouth daily.        . fenofibrate (TRICOR) 145 MG tablet Take 145 mg by mouth daily.        . fish oil-omega-3 fatty acids 1000 MG capsule Take 1 g by mouth daily.        Marland Kitchen FLUoxetine (PROZAC) 20 MG  capsule Take 20 mg by mouth daily.        Marland Kitchen levothyroxine (SYNTHROID, LEVOTHROID) 100 MCG tablet Take 100 mcg by mouth daily.        Marland Kitchen losartan (COZAAR) 50 MG tablet TAKE 1 TABLET BY MOUTH DAILY  90 tablet  2  . metoprolol (LOPRESSOR) 50 MG tablet Take 50 mg by mouth 2 (two) times daily.        Marland Kitchen oxybutynin (DITROPAN) 5 MG tablet 3 tabs po qd      . pantoprazole (PROTONIX) 40 MG tablet Take 40 mg by mouth daily.        . rosuvastatin (CRESTOR) 20 MG tablet Take 20 mg by mouth daily.        . vitamin C (ASCORBIC ACID) 500 MG tablet Take 500 mg by mouth daily.        Marland Kitchen VITAMIN D, CHOLECALCIFEROL, PO Take 1 tablet by mouth daily.        . vitamin E 400 UNIT capsule Take 1,000 Units by mouth daily.        No current facility-administered medications for this visit.    Allergies  Niaspan and Nitroglycerin  Electrocardiogram:  NSR rate 67 QT 438 Q in lead 3  Assessment and Plan

## 2012-09-06 NOTE — Assessment & Plan Note (Signed)
Well controlled.  Continue current medications and low sodium Dash type diet.    

## 2012-09-06 NOTE — Assessment & Plan Note (Signed)
Cholesterol is at goal.  Continue current dose of statin and diet Rx.  No myalgias or side effects.  F/U  LFT's in 6 months. No results found for this basename: LDLCALC             

## 2012-09-08 ENCOUNTER — Inpatient Hospital Stay (HOSPITAL_COMMUNITY)
Admission: EM | Admit: 2012-09-08 | Discharge: 2012-09-12 | DRG: 301 | Disposition: A | Discharge status: Home Health | Payer: Medicare Other | Attending: Internal Medicine | Admitting: Internal Medicine

## 2012-09-08 ENCOUNTER — Encounter (HOSPITAL_COMMUNITY): Payer: Self-pay

## 2012-09-08 ENCOUNTER — Emergency Department (HOSPITAL_COMMUNITY): Payer: Medicare Other

## 2012-09-08 DIAGNOSIS — Z9861 Coronary angioplasty status: Secondary | ICD-10-CM

## 2012-09-08 DIAGNOSIS — E119 Type 2 diabetes mellitus without complications: Secondary | ICD-10-CM | POA: Diagnosis present

## 2012-09-08 DIAGNOSIS — Z7982 Long term (current) use of aspirin: Secondary | ICD-10-CM

## 2012-09-08 DIAGNOSIS — Z951 Presence of aortocoronary bypass graft: Secondary | ICD-10-CM

## 2012-09-08 DIAGNOSIS — Z8249 Family history of ischemic heart disease and other diseases of the circulatory system: Secondary | ICD-10-CM

## 2012-09-08 DIAGNOSIS — E039 Hypothyroidism, unspecified: Secondary | ICD-10-CM | POA: Diagnosis present

## 2012-09-08 DIAGNOSIS — Z6838 Body mass index (BMI) 38.0-38.9, adult: Secondary | ICD-10-CM

## 2012-09-08 DIAGNOSIS — D72829 Elevated white blood cell count, unspecified: Secondary | ICD-10-CM | POA: Diagnosis present

## 2012-09-08 DIAGNOSIS — R509 Fever, unspecified: Secondary | ICD-10-CM | POA: Diagnosis not present

## 2012-09-08 DIAGNOSIS — Z79899 Other long term (current) drug therapy: Secondary | ICD-10-CM

## 2012-09-08 DIAGNOSIS — E669 Obesity, unspecified: Secondary | ICD-10-CM | POA: Diagnosis present

## 2012-09-08 DIAGNOSIS — Z7901 Long term (current) use of anticoagulants: Secondary | ICD-10-CM

## 2012-09-08 DIAGNOSIS — I824Y9 Acute embolism and thrombosis of unspecified deep veins of unspecified proximal lower extremity: Principal | ICD-10-CM | POA: Diagnosis present

## 2012-09-08 DIAGNOSIS — F411 Generalized anxiety disorder: Secondary | ICD-10-CM | POA: Diagnosis present

## 2012-09-08 DIAGNOSIS — I2510 Atherosclerotic heart disease of native coronary artery without angina pectoris: Secondary | ICD-10-CM | POA: Diagnosis present

## 2012-09-08 DIAGNOSIS — Z87891 Personal history of nicotine dependence: Secondary | ICD-10-CM

## 2012-09-08 DIAGNOSIS — F3289 Other specified depressive episodes: Secondary | ICD-10-CM | POA: Diagnosis present

## 2012-09-08 DIAGNOSIS — I10 Essential (primary) hypertension: Secondary | ICD-10-CM | POA: Diagnosis present

## 2012-09-08 DIAGNOSIS — I82409 Acute embolism and thrombosis of unspecified deep veins of unspecified lower extremity: Secondary | ICD-10-CM

## 2012-09-08 DIAGNOSIS — I824Z9 Acute embolism and thrombosis of unspecified deep veins of unspecified distal lower extremity: Secondary | ICD-10-CM | POA: Diagnosis present

## 2012-09-08 DIAGNOSIS — E785 Hyperlipidemia, unspecified: Secondary | ICD-10-CM | POA: Diagnosis present

## 2012-09-08 DIAGNOSIS — G609 Hereditary and idiopathic neuropathy, unspecified: Secondary | ICD-10-CM | POA: Diagnosis present

## 2012-09-08 LAB — BASIC METABOLIC PANEL
BUN: 19 mg/dL (ref 6–23)
CO2: 23 mEq/L (ref 19–32)
Calcium: 10.1 mg/dL (ref 8.4–10.5)
Creatinine, Ser: 0.8 mg/dL (ref 0.50–1.10)
GFR calc non Af Amer: 71 mL/min — ABNORMAL LOW (ref >90)
Glucose, Bld: 188 mg/dL — ABNORMAL HIGH (ref 70–99)
Sodium: 135 mEq/L (ref 135–145)

## 2012-09-08 LAB — CBC
Hemoglobin: 12.3 g/dL (ref 12.0–15.0)
MCH: 30.3 pg (ref 26.0–34.0)
MCHC: 34.2 g/dL (ref 30.0–36.0)
MCV: 88.7 fL (ref 78.0–100.0)
RBC: 4.06 MIL/uL (ref 3.87–5.11)

## 2012-09-08 LAB — PROTIME-INR: Prothrombin Time: 13.2 seconds (ref 11.6–15.2)

## 2012-09-08 LAB — URINALYSIS, ROUTINE W REFLEX MICROSCOPIC
Bilirubin Urine: NEGATIVE
Glucose, UA: NEGATIVE mg/dL
Hgb urine dipstick: NEGATIVE
Ketones, ur: NEGATIVE mg/dL
Protein, ur: NEGATIVE mg/dL
Urobilinogen, UA: 0.2 mg/dL (ref 0.0–1.0)

## 2012-09-08 LAB — URINE MICROSCOPIC-ADD ON

## 2012-09-08 LAB — GLUCOSE, CAPILLARY
Glucose-Capillary: 136 mg/dL — ABNORMAL HIGH (ref 70–99)
Glucose-Capillary: 108 mg/dL — ABNORMAL HIGH (ref 70–99)

## 2012-09-08 MED ORDER — FLUOXETINE HCL 20 MG PO CAPS
20.0000 mg | ORAL_CAPSULE | Freq: Every day | ORAL | Status: DC
  Administered 2012-09-08 – 2012-09-11 (×4): 20 mg via ORAL
  Filled 2012-09-08 (×5): qty 1

## 2012-09-08 MED ORDER — MORPHINE SULFATE 4 MG/ML IJ SOLN
4.0000 mg | Freq: Once | INTRAMUSCULAR | Status: AC
  Administered 2012-09-08: 4 mg via INTRAVENOUS
  Filled 2012-09-08: qty 1

## 2012-09-08 MED ORDER — FENOFIBRATE 160 MG PO TABS
160.0000 mg | ORAL_TABLET | Freq: Every day | ORAL | Status: DC
  Administered 2012-09-08 – 2012-09-12 (×5): 160 mg via ORAL
  Filled 2012-09-08 (×5): qty 1

## 2012-09-08 MED ORDER — WARFARIN VIDEO
Freq: Once | Status: AC
  Administered 2012-09-08: 22:00:00

## 2012-09-08 MED ORDER — OMEGA-3 FATTY ACIDS 1000 MG PO CAPS: 1.0000 g | ORAL_CAPSULE | Freq: Every day | ORAL | Status: DC

## 2012-09-08 MED ORDER — WARFARIN - PHARMACIST DOSING INPATIENT: Freq: Every day | Status: DC

## 2012-09-08 MED ORDER — LEVOTHYROXINE SODIUM 100 MCG PO TABS
100.0000 ug | ORAL_TABLET | Freq: Every day | ORAL | Status: DC
  Administered 2012-09-09 – 2012-09-12 (×4): 100 ug via ORAL
  Filled 2012-09-08 (×6): qty 1

## 2012-09-08 MED ORDER — NIACIN ER 500 MG PO CPCR
500.0000 mg | ORAL_CAPSULE | Freq: Every day | ORAL | Status: DC
  Administered 2012-09-08: 500 mg via ORAL
  Filled 2012-09-08 (×3): qty 1

## 2012-09-08 MED ORDER — HEPARIN (PORCINE) IN NACL 100-0.45 UNIT/ML-% IJ SOLN
1600.0000 [IU]/h | INTRAMUSCULAR | Status: DC
  Administered 2012-09-08: 1200 [IU]/h via INTRAVENOUS
  Administered 2012-09-08: 1350 [IU]/h via INTRAVENOUS
  Administered 2012-09-09 – 2012-09-12 (×5): 1600 [IU]/h via INTRAVENOUS
  Filled 2012-09-08 (×9): qty 250

## 2012-09-08 MED ORDER — PANTOPRAZOLE SODIUM 40 MG PO TBEC
40.0000 mg | DELAYED_RELEASE_TABLET | Freq: Every day | ORAL | Status: DC
  Administered 2012-09-08 – 2012-09-12 (×5): 40 mg via ORAL
  Filled 2012-09-08 (×5): qty 1

## 2012-09-08 MED ORDER — VITAMIN E 45 MG (100 UNIT) PO CAPS
1000.0000 [IU] | ORAL_CAPSULE | Freq: Every day | ORAL | Status: DC
  Administered 2012-09-09 – 2012-09-12 (×4): 1000 [IU] via ORAL
  Filled 2012-09-08 (×5): qty 2

## 2012-09-08 MED ORDER — ALPRAZOLAM 0.5 MG PO TABS
0.5000 mg | ORAL_TABLET | Freq: Every evening | ORAL | Status: DC | PRN
  Administered 2012-09-10 – 2012-09-11 (×3): 0.5 mg via ORAL
  Filled 2012-09-08 (×3): qty 1

## 2012-09-08 MED ORDER — SODIUM CHLORIDE 0.9 % IJ SOLN: 3.0000 mL | Freq: Two times a day (BID) | INTRAMUSCULAR | Status: DC

## 2012-09-08 MED ORDER — SODIUM CHLORIDE 0.9 % IJ SOLN
3.0000 mL | Freq: Two times a day (BID) | INTRAMUSCULAR | Status: DC
  Administered 2012-09-12: 3 mL via INTRAVENOUS

## 2012-09-08 MED ORDER — HEPARIN BOLUS VIA INFUSION
1200.0000 [IU] | Freq: Once | INTRAVENOUS | Status: AC
  Administered 2012-09-08: 1200 [IU] via INTRAVENOUS
  Filled 2012-09-08: qty 1200

## 2012-09-08 MED ORDER — METOPROLOL TARTRATE 50 MG PO TABS
50.0000 mg | ORAL_TABLET | Freq: Two times a day (BID) | ORAL | Status: DC
  Administered 2012-09-08 – 2012-09-12 (×9): 50 mg via ORAL
  Filled 2012-09-08 (×10): qty 1

## 2012-09-08 MED ORDER — ONDANSETRON HCL 4 MG/2ML IJ SOLN
4.0000 mg | Freq: Once | INTRAMUSCULAR | Status: AC
  Administered 2012-09-08: 4 mg via INTRAVENOUS
  Filled 2012-09-08: qty 2

## 2012-09-08 MED ORDER — ACETAMINOPHEN 325 MG PO TABS
650.0000 mg | ORAL_TABLET | Freq: Four times a day (QID) | ORAL | Status: DC | PRN
  Administered 2012-09-08 – 2012-09-11 (×3): 650 mg via ORAL
  Filled 2012-09-08 (×3): qty 2

## 2012-09-08 MED ORDER — OXYBUTYNIN CHLORIDE 5 MG PO TABS
5.0000 mg | ORAL_TABLET | Freq: Four times a day (QID) | ORAL | Status: DC
  Administered 2012-09-08 – 2012-09-12 (×15): 5 mg via ORAL
  Filled 2012-09-08 (×21): qty 1

## 2012-09-08 MED ORDER — VITAMIN C 500 MG PO TABS
500.0000 mg | ORAL_TABLET | Freq: Every day | ORAL | Status: DC
  Administered 2012-09-08 – 2012-09-12 (×5): 500 mg via ORAL
  Filled 2012-09-08 (×5): qty 1

## 2012-09-08 MED ORDER — LOSARTAN POTASSIUM 50 MG PO TABS
50.0000 mg | ORAL_TABLET | Freq: Every day | ORAL | Status: DC
  Administered 2012-09-08 – 2012-09-12 (×5): 50 mg via ORAL
  Filled 2012-09-08 (×5): qty 1

## 2012-09-08 MED ORDER — OMEGA-3-ACID ETHYL ESTERS 1 G PO CAPS
1.0000 g | ORAL_CAPSULE | Freq: Every day | ORAL | Status: DC
  Administered 2012-09-08 – 2012-09-12 (×5): 1 g via ORAL
  Filled 2012-09-08 (×5): qty 1

## 2012-09-08 MED ORDER — SODIUM CHLORIDE 0.9 % IV SOLN: 250.0000 mL | INTRAVENOUS | Status: DC | PRN

## 2012-09-08 MED ORDER — MORPHINE SULFATE 2 MG/ML IJ SOLN
1.0000 mg | INTRAMUSCULAR | Status: DC | PRN
  Administered 2012-09-08 – 2012-09-09 (×3): 2 mg via INTRAVENOUS
  Filled 2012-09-08 (×3): qty 1

## 2012-09-08 MED ORDER — WARFARIN SODIUM 6 MG PO TABS
6.0000 mg | ORAL_TABLET | Freq: Once | ORAL | Status: AC
  Administered 2012-09-08: 6 mg via ORAL
  Filled 2012-09-08: qty 1

## 2012-09-08 MED ORDER — NIACIN ER (ANTIHYPERLIPIDEMIC) 500 MG PO TBCR: 500.0000 mg | EXTENDED_RELEASE_TABLET | Freq: Every day | ORAL | Status: DC

## 2012-09-08 MED ORDER — HEPARIN BOLUS VIA INFUSION
4000.0000 [IU] | Freq: Once | INTRAVENOUS | Status: AC
  Administered 2012-09-08: 4000 [IU] via INTRAVENOUS

## 2012-09-08 MED ORDER — ASPIRIN 325 MG PO TABS
325.0000 mg | ORAL_TABLET | Freq: Every day | ORAL | Status: DC
  Administered 2012-09-08 – 2012-09-11 (×4): 325 mg via ORAL
  Filled 2012-09-08 (×5): qty 1

## 2012-09-08 MED ORDER — SODIUM CHLORIDE 0.9 % IJ SOLN: 3.0000 mL | INTRAMUSCULAR | Status: DC | PRN

## 2012-09-08 MED ORDER — ATORVASTATIN CALCIUM 40 MG PO TABS
40.0000 mg | ORAL_TABLET | Freq: Every day | ORAL | Status: DC
  Administered 2012-09-08 – 2012-09-11 (×4): 40 mg via ORAL
  Filled 2012-09-08 (×5): qty 1

## 2012-09-08 MED ORDER — ACETAMINOPHEN 650 MG RE SUPP: 650.0000 mg | Freq: Four times a day (QID) | RECTAL | Status: DC | PRN

## 2012-09-08 MED ORDER — ASPIRIN 325 MG PO TABS
325.0000 mg | ORAL_TABLET | Freq: Every day | ORAL | Status: DC
  Filled 2012-09-08: qty 1

## 2012-09-08 MED ORDER — COUMADIN BOOK
Freq: Once | Status: AC
  Administered 2012-09-08: 17:00:00
  Filled 2012-09-08: qty 1

## 2012-09-08 NOTE — ED Provider Notes (Signed)
History     CSN: [id number]  Arrival date & time 09/08/12  0547   First MD Initiated Contact with Patient 09/08/12 [phone]      Chief Complaint  Patient presents with  . DVT    (Consider location/radiation/quality/duration/timing/severity/associated sxs/prior treatment) The history is provided by the patient and medical records. No language interpreter was used.   Rachel Conner is a pleasant 77 year old female who presents emergency department with chief complaint of left lower leg pain.  Patient states she has had an aching pain in the left leg for approximately one week.  Patient has a history of osteoarthritis and attributed her pain to that.  Patient states that last night her pain became intense.  She developed a cool extremity with 10 out of 10 pain.  Walking on her leg makes the pain worse.  It is relieved by nothing.  Patient states that she has a history of heart disease and bypass surgery.  6 months after her surgery she did develop clots around her stents.  She's not taking any kind of anticoagulant except for daily aspirin.  Patient complains that she has numbness and tingling in the sole of the left foot.  She denies any history of pulmonary embolism or DVT.  She denies any chest pain, shortness of breath, nausea, vomiting.  Patient denies any cough or hemoptysis. Denies fevers, chills, myalgias, arthralgias. Denies DOE, SOB, chest tightness or pressure, radiation to left arm, jaw or back, or diaphoresis. Denies dysuria, flank pain, suprapubic pain, frequency, urgency, or hematuria. Denies headaches, light headedness, weakness, visual disturbances. Denies abdominal pain, nausea, vomiting, diarrhea or constipation.    Past Medical History  Diagnosis Date  . Arthritis   . CAD (coronary artery disease) 1999    CABG  . Diabetes mellitus   . HLD (hyperlipidemia)   . HTN (hypertension)   . Hypothyroidism   . Colon polyps   . Hiatal hernia   . Peripheral neuropathy   . Anxiety    . Hemorrhoids     Past Surgical History  Procedure Laterality Date  . Coronary artery bypass graft  1999  . Carotid stent  07/1998    stent RCA   . Coronary stent placement  10/2001    circumflex stent  . Back surgery    . Thyroid surgery    . Cholecystectomy      Family History  Problem Relation Age of Onset  . Heart disease Father   . Heart disease Mother   . Heart disease Brother   . Heart disease Other     grandfather (side unknown)  . Colon cancer Neg Hx     History  Substance Use Topics  . Smoking status: Former Games developer  . Smokeless tobacco: Not on file  . Alcohol Use: No    OB History   Grav Para Term Preterm Abortions TAB SAB Ect Mult Living                  Review of Systems Ten systems reviewed and are negative for acute change, except as noted in the HPI.   Allergies  Niaspan and Nitroglycerin  Home Medications   Current Outpatient Rx  Name  Route  Sig  Dispense  Refill  . ALPRAZolam (XANAX) 0.5 MG tablet   Oral   Take 0.5 mg by mouth at bedtime as needed for anxiety.          Marland Kitchen aspirin 325 MG tablet   Oral   Take 325 mg  by mouth daily.          . fenofibrate (TRICOR) 145 MG tablet   Oral   Take 145 mg by mouth daily.           . fish oil-omega-3 fatty acids 1000 MG capsule   Oral   Take 1 g by mouth daily.           Marland Kitchen FLUoxetine (PROZAC) 20 MG capsule   Oral   Take 20 mg by mouth daily.           Marland Kitchen levothyroxine (SYNTHROID, LEVOTHROID) 100 MCG tablet   Oral   Take 100 mcg by mouth daily.           Marland Kitchen losartan (COZAAR) 50 MG tablet      TAKE 1 TABLET BY MOUTH DAILY   90 tablet   2   . metoprolol (LOPRESSOR) 50 MG tablet   Oral   Take 50 mg by mouth 2 (two) times daily.           . niacin (NIASPAN) 500 MG CR tablet   Oral   Take 500 mg by mouth at bedtime.         Marland Kitchen oxybutynin (DITROPAN) 5 MG tablet   Oral   Take 5 mg by mouth 4 (four) times daily.          . pantoprazole (PROTONIX) 40 MG tablet    Oral   Take 40 mg by mouth daily.           . rosuvastatin (CRESTOR) 20 MG tablet   Oral   Take 20 mg by mouth daily.           . vitamin C (ASCORBIC ACID) 500 MG tablet   Oral   Take 500 mg by mouth daily.           . vitamin E 400 UNIT capsule   Oral   Take 1,000 Units by mouth daily.            BP 108/58  Pulse 61  Temp(Src) 98.5 F (36.9 C) (Oral)  Resp 18  SpO2 98%  Physical Exam  Physical Exam  Nursing note and vitals reviewed. Constitutional: She is oriented to person, place, and time. She appears well-developed and well-nourished. No distress.  HENT:  Head: Normocephalic and atraumatic.  Eyes: Conjunctivae normal and EOM are normal. Pupils are equal, round, and reactive to light. No scleral icterus.  Neck: Normal range of motion.  Cardiovascular: Normal rate, regular rhythm and normal heart sounds.  Exam reveals no gallop and no friction rub.  Left lower extremity is cool to the touch.  Her capillary refill is greater than 3 seconds in the left foot.  Femoral pulse is palpable.  Unable to palpate popliteal, dorsalis pedis or posterior tibialis pulse. Doppler Ultrasound was employed, unable to auscultate  pulse in the left foot.  Distal pulses palpable in the right foot.  Patient's left calf and leg with levator reticularis.  Tight swelling from the mid thigh down. No murmur heard. Pulmonary/Chest: Effort normal and breath sounds normal. No respiratory distress.  Abdominal: Soft. Bowel sounds are normal. She exhibits no distension and no mass. There is no tenderness. There is no guarding.  Neurological: She is alert and oriented to person, place, and time.  Skin: Skin is warm and dry. She is not diaphoretic.    ED Course  Procedures (including critical care time)  Labs Reviewed  CBC  BASIC METABOLIC PANEL  URINALYSIS,  ROUTINE W REFLEX MICROSCOPIC  PROTIME-INR   No results found.   Date: 09/08/2012  Rate: 62  Rhythm: normal sinus rhythm  QRS Axis:  normal  Intervals: normal  ST/T Wave abnormalities: normal  Conduction Disutrbances:none  Narrative Interpretation:   Old EKG Reviewed: unchanged    1. DVT (deep venous thrombosis), left   2. Phlegmasia cerulea dolens, left       MDM  7:00 AM BP 108/58  Pulse 61  Temp(Src) 98.5 F (36.9 C) (Oral)  Resp 18  SpO2 98% Patient seen and shared visit with Dr. Norlene Campbell.  There is concern for possible arterial blockage of the left leg.  Labs are currently being drawn.  Will consult vascular surgery.  Patient rates her pain currently an 8/10.  Pain control has been initiated with morphine IV heparin per pharmacy consult.  8:45 AM Patient with confirmed DVT extending from peroneal to the common femoral (please refer to the notes from [place]).  Patient will be admitted. Vascular consult initiated in the ED.     Arthor Captain, PA-C 09/12/12 1536

## 2012-09-08 NOTE — ED Notes (Signed)
Pt reminded of need for urine sample.  

## 2012-09-08 NOTE — Consult Note (Signed)
[facility] CONSULT NOTE 09/08/2012 DOB: [id number] MRN : [id number]  CC: swollen painful left leg Referring Physician: ER Physician  History of Present Illness: Rachel Conner is a 77 y.o. female with hx CAD, DM, HTN and Hypothyroidism who was doing well until a few days ago when she developed some swelling in left leg. This got progressively worse and became more swollen and very painful last pm. Pt came to the ER and vasc lab studies showed:  Preliminary report: Left: DVT noted from the CFV through the PTV and Peroneal v including the gastrocnemius veins. Superficial thrombosis of the GSV from the SFJ through the mid thigh. 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 Right CFV patent. Right CFA waveform triphasic.  She has good sensation and motion in the leg. She denies recent ravel or surgery. She states she had a clot in her cardiac stent and they used "clot buster" on this. She denies Hx of A.Fib or irregular hearbeat.   Past Medical History  Diagnosis Date  . Arthritis   . CAD (coronary artery disease) 1999    CABG  . Diabetes mellitus   . HLD (hyperlipidemia)   . HTN (hypertension)   . Hypothyroidism   . Colon polyps   . Hiatal hernia   . Peripheral neuropathy   . Anxiety   . Hemorrhoids     Past Surgical History  Procedure Laterality Date  . Coronary artery bypass graft  1999  . Carotid stent  07/1998    stent RCA   . Coronary stent placement  10/2001    circumflex stent  . Back surgery    . Thyroid surgery    . Cholecystectomy       ROS: [x]  Positive  [ ]  Denies    General: [ ]  Weight loss, [ ]  Fever, [ ]  chills Neurologic: [ ]  Dizziness, [ ]  Blackouts, [ ]  Seizure [ ]  Stroke, [ ]  "Mini stroke", [ ]  Slurred speech, [ ]  Temporary blindness; [ ]  weakness in arms or legs, [ ]  Hoarseness Cardiac: [ ]  Chest pain/pressure, [ ]  Shortness of breath at rest [ ]  Shortness of breath with exertion, [ ]  Atrial  fibrillation or irregular heartbeat Vascular: [ ]  Pain in legs with walking, [ ]  Pain in legs at rest, [ ]  Pain in legs at night,  [ ]  Non-healing ulcer, [x ] Blood clot in vein/DVT,   Pulmonary: [ ]  Home oxygen, [ ]  Productive cough, [ ]  Coughing up blood, [ ]  Asthma,  [ ]  Wheezing Musculoskeletal:  [ ]  Arthritis, [x ] Low back pain, [ ]  Joint pain Hematologic: [ ]  Easy Bruising, [ ]  Anemia; [ ]  Hepatitis Gastrointestinal: [ ]  Blood in stool, [ ]  Gastroesophageal Reflux/heartburn, [ ]  Trouble swallowing Urinary: [ ]  chronic Kidney disease, [ ]  on HD - [ ]  MWF or [ ]  TTHS, [ ]  Burning with urination, [ ]  Difficulty urinating Skin: [ ]  Rashes, [ ]  Wounds Psychological: [ ]  Anxiety, [ ]  Depression  Social History History  Substance Use Topics  . Smoking status: Former Games developer  . Smokeless tobacco: Not on file  . Alcohol Use: No    Family History Family History  Problem Relation Age of Onset  . Heart disease Father   . Heart disease Mother   . Heart disease Brother   . Heart disease Other     grandfather (side unknown)  . Colon cancer Neg Hx     Allergies  Allergen  Reactions  . Niaspan (Niacin Er)     Severe flushing  . Nitroglycerin Other (See Comments)    Blood pressure drops down to low    Current Facility-Administered Medications  Medication Dose Route Frequency Provider Last Rate Last Dose  . heparin ADULT infusion 100 units/mL (25000 units/250 mL)  1,200 Units/hr Intravenous Continuous Colleen Can, PHARMD 12 mL/hr at 09/08/12 0748 1,200 Units/hr at 09/08/12 1308   Current Outpatient Prescriptions  Medication Sig Dispense Refill  . ALPRAZolam (XANAX) 0.5 MG tablet Take 0.5 mg by mouth at bedtime as needed for anxiety.       Marland Kitchen aspirin 325 MG tablet Take 325 mg by mouth daily.       . fenofibrate (TRICOR) 145 MG tablet Take 145 mg by mouth daily.        . fish oil-omega-3 fatty acids 1000 MG capsule Take 1 g by mouth daily.        Marland Kitchen FLUoxetine (PROZAC) 20 MG  capsule Take 20 mg by mouth daily.        Marland Kitchen levothyroxine (SYNTHROID, LEVOTHROID) 100 MCG tablet Take 100 mcg by mouth daily.        Marland Kitchen losartan (COZAAR) 50 MG tablet TAKE 1 TABLET BY MOUTH DAILY  90 tablet  2  . metoprolol (LOPRESSOR) 50 MG tablet Take 50 mg by mouth 2 (two) times daily.        . niacin (NIASPAN) 500 MG CR tablet Take 500 mg by mouth at bedtime.      Marland Kitchen oxybutynin (DITROPAN) 5 MG tablet Take 5 mg by mouth 4 (four) times daily.       . pantoprazole (PROTONIX) 40 MG tablet Take 40 mg by mouth daily.        . rosuvastatin (CRESTOR) 20 MG tablet Take 20 mg by mouth daily.        . vitamin C (ASCORBIC ACID) 500 MG tablet Take 500 mg by mouth daily.        . vitamin E 400 UNIT capsule Take 1,000 Units by mouth daily.          Imaging: Dg Chest Port 1 View  09/08/2012  *RADIOLOGY REPORT*  Clinical Data: Preoperative respiratory exam.  Deep venous thrombosis of the left leg.  PORTABLE CHEST - 1 VIEW  Comparison: 10/20/2009  Findings: The heart size and pulmonary vascularity are normal and the lungs are clear.  CABG.  No acute osseous abnormality.  IMPRESSION: No acute disease.   Original Report Authenticated By: Francene Boyers, M.D.     Significant Diagnostic Studies: CBC Lab Results  Component Value Date   WBC 13.6* 09/08/2012   HGB 12.3 09/08/2012   HCT 36.0 09/08/2012   MCV 88.7 09/08/2012   PLT 246 09/08/2012    BMET    Component Value Date/Time   NA 135 09/08/2012 0706   K 4.0 09/08/2012 0706   CL 101 09/08/2012 0706   CO2 23 09/08/2012 0706   GLUCOSE 188* 09/08/2012 0706   BUN 19 09/08/2012 0706   CREATININE 0.80 09/08/2012 0706   CALCIUM 10.1 09/08/2012 0706   GFRNONAA 71* 09/08/2012 0706   GFRAA 82* 09/08/2012 0706    COAG Lab Results  Component Value Date   INR 1.01 09/08/2012   No results found for this basename: PTT     Physical Examination BP Readings from Last 3 Encounters:  09/08/12 117/82  09/06/12 142/80  11/19/11 150/80   Temp Readings from Last 3  Encounters:  09/08/12 98.5 F (  36.9 C) Oral   SpO2 Readings from Last 3 Encounters:  09/08/12 97%   Pulse Readings from Last 3 Encounters:  09/08/12 63  09/06/12 67  11/19/11 68    General:  WDWN in NAD Gait: pt at bedrest with DVT HENT: WNL Eyes: Pupils equal Pulmonary: normal non-labored breathing , without Rales, rhonchi,  wheezing Cardiac: RRR, without  Murmurs, rubs or gallops; Abdomen: soft, NT, no masses Skin: no rashes, ulcers noted Vascular Exam/Pulses: 2+ femoral pulse palp left 2+ DP palp on right 1+ palp on left Biphasic PT signal and monophasic DP signal on left LLE with swelling from groin to foot Left foot warm/pink with good sensation and motion Extremities without ischemic changes, no Gangrene , no cellulitis; no open wounds;  Musculoskeletal: no muscle wasting or atrophy  Neurologic: A&O X 3; Appropriate Affect ;  Speech is fluent/normal  Non-Invasive Vascular Imaging: 09/08/2012  Preliminary report: Left: DVT noted from the CFV through the PTV and Peroneal v including the gastrocnemius veins. Superficial thrombosis of the GSV from the SFJ through the mid thigh. 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 Right CFV patent. Right CFA waveform triphasic.  ASSESSMENT/PLAN: CFV to distal DVT  Heparin drip started Arterial flow bidirectional suggestive of phlegmasia cerulea dolens Dr. Hart [place] to assess   Agree with above assessment Left leg diffusely edematous from inguinal crease to foot Good motion and sensation left foot with no pain Brisk Doppler flow heard in DP and PT left foot  Patient does have DVT on clinical exam-confirmed by ultrasound-no evidence of ischemia  Recommend #1 elevate lower extremity and 20 #2 IV heparin #3 chronic anticoagulation with Coumadin No role for surgical management

## 2012-09-08 NOTE — Progress Notes (Signed)
Patient arrived at 1130 from the ed via stretcher, alert and oriented x 4, cooperative and denies complaints.  Tele placed and called central monitoring, patient is nsr.  Patient oriented to unit routine and safety - d/t high fall risk and her left leg - instructed patient and family that she was placed on strict bedrest and not to get up without assist.  Patient and family stated understanding and call bell in reach.  Continue to monitor

## 2012-09-08 NOTE — ED Notes (Signed)
Pt c/o left leg pain 5/10. States it has been hurting x1 week but to tonight noticed color change and swelling. Pedal pulse present, leg cool to touch, swollen and dusky.

## 2012-09-08 NOTE — Progress Notes (Signed)
VASCULAR LAB PRELIMINARY  PRELIMINARY  PRELIMINARY  PRELIMINARY  Left lower extremity venous duplex completed.    Preliminary report:  Left: DVT noted from the CFV through the PTV and Peroneal v including the gastrocnemius veins.  Superficial thrombosis of the GSV from the SFJ through the mid thigh.  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  Right CFV patent.  Right CFA waveform triphasic.  Breta Demedeiros, RVT 09/08/2012, 8:36 AM

## 2012-09-08 NOTE — Progress Notes (Signed)
ANTICOAGULATION CONSULT NOTE - Follow Up Consult  Pharmacy Consult for Coumadin Indication: DVT  Allergies  Allergen Reactions  . Niaspan (Niacin Er)     Severe flushing  . Nitroglycerin Other (See Comments)    Blood pressure drops down to low    Patient Measurements: Height: 5\' 5"  (165.1 cm) Weight: 227 lb (102.967 kg) IBW/kg (Calculated) : 57 Heparin Dosing Weight: 81kg  Vital Signs: Temp: 98.2 F (36.8 C) (02/28 1118) Temp src: Oral (02/28 0559) BP: 131/86 mmHg (02/28 1118) Pulse Rate: 67 (02/28 1118)  Labs:  Recent Labs  09/08/12 0706  HGB 12.3  HCT 36.0  PLT 246  LABPROT 13.2  INR 1.01  CREATININE 0.80    Estimated Creatinine Clearance: 73.4 ml/min (by C-G formula based on Cr of 0.8).   Medications:  Heparin 1200 units/hr  Assessment: 76yof on heparin for acute LLE DVT to start bridging to Coumadin (day 1 of minimum 5 day overlap).  - Baseline INR: 1.01 - H/H and Plts wnl - No significant bleeding reported - Warfarin points: 4  Goal of Therapy:  INR 2-3 Monitor platelets by anticoagulation protocol: Yes   Plan:  1. Coumadin 6mg  po x 1 today 2. Continue heparin 1200 units/hr (12 ml/hr) 3. Follow-up 1600 heparin level 4. Daily INR, heparin level and CBC 5. Coumadin educational book/video  Cleon Dew [phone] 09/08/2012,12:45 PM

## 2012-09-08 NOTE — ED Notes (Signed)
Per EMS, pt has had left leg pain x1 week. Tonight noticed swelling and discoloration of left leg.

## 2012-09-08 NOTE — Progress Notes (Signed)
Utilization review completed.  P.J. Yuriana Gaal,RN,BSN Case Manager [phone]  

## 2012-09-08 NOTE — ED Provider Notes (Signed)
Medical screening examination/treatment/procedure(s) were conducted as a shared visit with non-physician practitioner(s) and myself.  I personally evaluated the patient during the encounter.  Pt with pain to left leg for 1 week, now with swelling, discoloration.  Unable to find pulse either PT or DP with u/s or doppler.  Leg is swollen, tense, with appears congested with reticular like purple discoloration.  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  D/s Dr Hart Rochester on call for vascular surgery who agrees with heparin, and venous/arterial doppler studies.  He wishes call back once studies done.  He feels pt will need admission to medicine service.    Olivia Mackie, MD 09/08/12 [phone]

## 2012-09-08 NOTE — ED Notes (Signed)
Legs elevated per request of Vascular MD

## 2012-09-08 NOTE — Progress Notes (Signed)
ANTICOAGULATION CONSULT NOTE - Follow Up Consult  Pharmacy Consult for Heparin Indication: DVT  Patient Measurements: Height: 5\' 5"  (165.1 cm) Weight: 227 lb (102.967 kg) IBW/kg (Calculated) : 57 Heparin Dosing Weight: 81kg  Recent Labs  09/08/12 0706 09/08/12 1544  HGB 12.3  --   HCT 36.0  --   PLT 246  --   LABPROT 13.2  --   INR 1.01  --   HEPARINUNFRC  --  0.22*  CREATININE 0.80  --    Estimated Creatinine Clearance: 73.4 ml/min (by C-G formula based on Cr of 0.8).  Medications:  IV Heparin 1200 units/hr  Assessment: 76yo female on heparin for acute LLE DVT to start bridging to Coumadin (day 1 of minimum 5 day overlap). Heparin level sub-therapeutic at 0.22 after starting heparin this morning. No issues with infusion and no bleeding per RN.  Goal of Therapy:  Heparin level 0.3 - 0.7 Monitor platelets by anticoagulation protocol: Yes   Plan:  1) Heparin bolus IV 1200 units x 1, then increase rate to 1350 units/hr 2) Check 8 hour heparin level 3) Daily heparin level, CBC 4) Monitor signs/symptoms of bleeding  Benjaman Pott, PharmD, BCPS 09/08/2012   4:35 PM

## 2012-09-08 NOTE — H&P (Signed)
Hospital Admission Note Date: 09/08/2012  Patient name: Rachel Conner Medical record number: [id number] Date of birth: [birth year] Age: 77 y.o. Gender: female PCP: Mickle Plumb, NP  Medical Service: Internal medicine teaching service  Attending physician: Dr.Comer    1st Contact: Shirlee Latch    Pager: [phone] 2nd Contact: Sharda    Pager: [phone] After 5 pm or weekends: 1st Contact:      Pager: [phone] 2nd Contact:      Pager: [phone]  Chief Complaint: Left leg swelling and pain.  History of Present Illness: Patient is a pleasant 64 year woman with past history of cardiovascular disease including CAD status post CABG and stenting, hyperlipidemia, hypothyroidism and other medical problems as per problem list who comes to the ED with chief complaint of left leg swelling and pain for about a week. She developed left lower extremity swelling about a week before which progressively got worse until yesterday. She also has associated pain which got worse with ambulation. She denies any recent long car or air travel and also did not have any recent surgery or immobilization. Denies any history of leg clots.  She denies any chest pain, short of breath, palpitations, increased work of breathing, headache, nausea, vomiting, abdominal pain, diarrhea. She does not smoke or drink alcohol.  Meds: Current Outpatient Rx  Name  Route  Sig  Dispense  Refill  . ALPRAZolam (XANAX) 0.5 MG tablet   Oral   Take 0.5 mg by mouth at bedtime as needed for anxiety.          Marland Kitchen aspirin 325 MG tablet   Oral   Take 325 mg by mouth daily.          . fenofibrate (TRICOR) 145 MG tablet   Oral   Take 145 mg by mouth daily.           . fish oil-omega-3 fatty acids 1000 MG capsule   Oral   Take 1 g by mouth daily.           Marland Kitchen FLUoxetine (PROZAC) 20 MG capsule   Oral   Take 20 mg by mouth daily.           Marland Kitchen levothyroxine (SYNTHROID, LEVOTHROID) 100 MCG tablet   Oral   Take 100 mcg by mouth daily.            Marland Kitchen losartan (COZAAR) 50 MG tablet      TAKE 1 TABLET BY MOUTH DAILY   90 tablet   2   . metoprolol (LOPRESSOR) 50 MG tablet   Oral   Take 50 mg by mouth 2 (two) times daily.           . niacin (NIASPAN) 500 MG CR tablet   Oral   Take 500 mg by mouth at bedtime.         Marland Kitchen oxybutynin (DITROPAN) 5 MG tablet   Oral   Take 5 mg by mouth 4 (four) times daily.          . pantoprazole (PROTONIX) 40 MG tablet   Oral   Take 40 mg by mouth daily.           . rosuvastatin (CRESTOR) 20 MG tablet   Oral   Take 20 mg by mouth daily.           . vitamin C (ASCORBIC ACID) 500 MG tablet   Oral   Take 500 mg by mouth daily.           Marland Kitchen  vitamin E 400 UNIT capsule   Oral   Take 1,000 Units by mouth daily.            Allergies: Allergies as of 09/08/2012 - Review Complete 09/08/2012  Allergen Reaction Noted  . Niaspan (niacin er)  01/25/2012  . Nitroglycerin Other (See Comments) 01/24/2008   Past Medical History  Diagnosis Date  . Arthritis   . CAD (coronary artery disease) 1999    CABG  . Diabetes mellitus   . HLD (hyperlipidemia)   . HTN (hypertension)   . Hypothyroidism   . Colon polyps   . Hiatal hernia   . Peripheral neuropathy   . Anxiety   . Hemorrhoids    Past Surgical History  Procedure Laterality Date  . Coronary artery bypass graft  1999  . Carotid stent  07/1998    stent RCA   . Coronary stent placement  10/2001    circumflex stent  . Back surgery    . Thyroid surgery    . Cholecystectomy     Family History  Problem Relation Age of Onset  . Heart disease Father   . Heart disease Mother   . Heart disease Brother   . Heart disease Other     grandfather (side unknown)  . Colon cancer Neg Hx    History   Social History  . Marital Status: Married    Spouse Name: N/A    Number of Children: 3  . Years of Education: N/A   Occupational History  . retired    Social History Main Topics  . Smoking status: Former Games developer  .  Smokeless tobacco: Not on file  . Alcohol Use: No  . Drug Use: No  . Sexually Active: Not on file   Other Topics Concern  . Not on file   Social History Narrative  . No narrative on file    Review of Systems: As per history of present illness, all other systems reviewed and negative.  Physical Exam: Blood pressure 120/54, pulse 64, temperature 98.5 F (36.9 C), temperature source Oral, resp. rate 17, height 5\' 5"  (1.651 m), weight 227 lb (102.967 kg), SpO2 96.00%. Constitutional: Vital signs reviewed.  Patient is obese, in no acute distress and cooperative with exam. Alert and oriented x3.  Head: Normocephalic and atraumatic Mouth: no erythema or exudates, MMM Eyes: PERRL, EOMI, conjunctivae normal, No scleral icterus.  Neck: Supple, Trachea midline normal ROM, No JVD Cardiovascular: RRR, S1 normal, S2 normal, no MRG Pulmonary/Chest: CTAB, no wheezes, rales, or rhonchi Abdominal: Soft. Non-tender, non-distended, bowel sounds are normal Musculoskeletal: No joint deformities, erythema, or stiffness, ROM full and no nontender Neurological: A&O x3, Strength is normal and symmetric bilaterally, cranial nerve II-XII are grossly intact, no focal motor deficit, sensory intact to light touch bilaterally.  Extremities: Left lower extremity swelling extending from foot to the thighs. No significant tenderness to palpation. Increased redness compared to the right side. No significant increased warmth. Homans sign negative. Prominent superficial veins. Skin: Warm, dry and intact.  Psychiatric: Normal mood and affect. speech and behavior is normal. Judgment and thought content normal. Cognition and memory are normal.     Lab results: Basic Metabolic Panel:  Recent Labs  40/98/11 0706  NA 135  K 4.0  CL 101  CO2 23  GLUCOSE 188*  BUN 19  CREATININE 0.80  CALCIUM 10.1   Liver Function Tests:  Recent Labs  09/06/12 1139  AST 24  ALT 23  ALKPHOS 28*  BILITOT 0.6  PROT 6.8   ALBUMIN 3.6  CBC:  Recent Labs  09/08/12 0706  WBC 13.6*  HGB 12.3  HCT 36.0  MCV 88.7  PLT 246   Fasting Lipid Panel:  Recent Labs  09/06/12 1139  CHOL 127  HDL 37.30*  LDLCALC 67  TRIG 113.0  CHOLHDL 3   Coagulation:  Recent Labs  09/08/12 0706  LABPROT 13.2  INR 1.01    Imaging results:  Dg Chest Port 1 View  09/08/2012  *RADIOLOGY REPORT*  Clinical Data: Preoperative respiratory exam.  Deep venous thrombosis of the left leg.  PORTABLE CHEST - 1 VIEW  Comparison: 10/20/2009  Findings: The heart size and pulmonary vascularity are normal and the lungs are clear.  CABG.  No acute osseous abnormality.  IMPRESSION: No acute disease.   Original Report Authenticated By: Francene Boyers, M.D.     Other results: EKG: Normal sinus rhythm. No significant ST-T wave abnormalities concerning for ischemia or infarction. No abnormalities concerning for pulmonary embolus.  Assessment & Plan by Problem: Principal Problem:   Leg DVT (deep venous thromboembolism), acute Active Problems:   HYPERTENSIVE CARDIOVASCULAR DISEASE, BENIGN   CORONARY ATHEROSCLEROSIS, NATIVE VESSEL  1) Acute left lower extremity DVT: Patient with swelling and pain of left lower extremity for about a week. lower extremity duplex preliminary result is as below- DVT noted from the CFV through the PTV and Peroneal v including the gastrocnemius veins. Superficial thrombosis of the GSV from the SFJ through the mid thigh. 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  Patient with no previous history of blood clots.  No signs and symptoms of PE at this point in time. We'll keep an eye on that.  - Admit to inpatient. - Heparin drip per pharmacy started in ED. - Start Coumadin per pharmacy. Discussed with patient and her daughter about choice between Coumadin and Xarelto- they would like to start Coumadin. - Will need compression stockings at discharge. - Will not do a hypercoagulable  panel now- not sure if would be of much help in this acute phase.  2) CAD status post CABG and stenting: Patient follows with Dr. Eden Emms - who saw her a couple of days before. Reported EF of 64%. Stable heart disease. - Continue aspirin, ARB, beta blocker, niacin, fenofibrate, statin at home dose.  3) Hypothyroidism: Continue Synthroid at 100 mcg daily. - No need to check TSH now.   Dispo: Disposition is deferred at this time, awaiting improvement of current medical problems. Anticipated discharge in approximately 3-4 day(s).   The patient does have a current PCP Larina Bras, Harriett Sine, NP), therefore will not be requiring OPC follow-up after discharge.   The patient does not have transportation limitations that hinder transportation to clinic appointments.  Signed: Amadu Schlageter 09/08/2012, 10:54 AM

## 2012-09-08 NOTE — Progress Notes (Signed)
ANTICOAGULATION CONSULT NOTE - Initial Consult  Pharmacy Consult for heparin Indication: DVT vs arterial occlusion  Allergies  Allergen Reactions  . Niaspan (Niacin Er)     Severe flushing  . Nitroglycerin Other (See Comments)    Blood pressure drops down to low    Patient Measurements: Height: 5\' 5"  (165.1 cm) Weight: 227 lb (102.967 kg) IBW/kg (Calculated) : 57 Heparin Dosing Weight: 85kg  Vital Signs: Temp: 98.5 F (36.9 C) (02/28 0559) Temp src: Oral (02/28 0559) BP: 108/58 mmHg (02/28 0559) Pulse Rate: 61 (02/28 0559)  Estimated Creatinine Clearance: 58.7 ml/min (by C-G formula based on Cr of 1).   Medical History: Past Medical History  Diagnosis Date  . Arthritis   . CAD (coronary artery disease) 1999    CABG  . Diabetes mellitus   . HLD (hyperlipidemia)   . HTN (hypertension)   . Hypothyroidism   . Colon polyps   . Hiatal hernia   . Peripheral neuropathy   . Anxiety   . Hemorrhoids     Assessment: 77yo female c/o LLE aching worsened with weight bearing, now with cool extremity, concern for DVT vs arterial occlusion, awaiting vascular surgery consult, to begin heparin.  Goal of Therapy:  Heparin level 0.3-0.7 units/ml Monitor platelets by anticoagulation protocol: Yes   Plan:  Will give heparin 4000 units IV bolus x1 followed by gtt at 1200 units/hr and monitor heparin levels and CBC.  Colleen Can PharmD BCPS 09/08/2012,7:08 AM

## 2012-09-08 NOTE — Progress Notes (Signed)
MD on call made aware of temp of 101. Order to give tylenol.

## 2012-09-09 DIAGNOSIS — I82409 Acute embolism and thrombosis of unspecified deep veins of unspecified lower extremity: Secondary | ICD-10-CM

## 2012-09-09 DIAGNOSIS — D72829 Elevated white blood cell count, unspecified: Secondary | ICD-10-CM | POA: Diagnosis present

## 2012-09-09 LAB — GLUCOSE, CAPILLARY
Glucose-Capillary: 126 mg/dL — ABNORMAL HIGH (ref 70–99)
Glucose-Capillary: 113 mg/dL — ABNORMAL HIGH (ref 70–99)

## 2012-09-09 LAB — URINE CULTURE: Colony Count: 35000

## 2012-09-09 LAB — CBC
HCT: 36.1 % (ref 36.0–46.0)
Hemoglobin: 11.9 g/dL — ABNORMAL LOW (ref 12.0–15.0)
MCHC: 33 g/dL (ref 30.0–36.0)
WBC: 12.1 10*3/uL — ABNORMAL HIGH (ref 4.0–10.5)

## 2012-09-09 LAB — HEPARIN LEVEL (UNFRACTIONATED)
Heparin Unfractionated: 0.2 IU/mL — ABNORMAL LOW (ref 0.30–0.70)
Heparin Unfractionated: 0.34 IU/mL (ref 0.30–0.70)

## 2012-09-09 LAB — PROTIME-INR
INR: 1.06 (ref 0.00–1.49)
Prothrombin Time: 13.7 seconds (ref 11.6–15.2)

## 2012-09-09 MED ORDER — WARFARIN SODIUM 7.5 MG PO TABS
7.5000 mg | ORAL_TABLET | Freq: Once | ORAL | Status: AC
  Administered 2012-09-09: 7.5 mg via ORAL
  Filled 2012-09-09: qty 1

## 2012-09-09 NOTE — H&P (Signed)
Internal Medicine Teaching Service Attending Note Date: 09/09/2012  Patient name: Rachel Conner  Medical record number: [id number]  Date of birth: 1936/01/28   CC: Leg swelling  I have seen and evaluated Rachel Conner and discussed their care with the Residency Team.    Rachel Conner is a 77yo woman who presented to the ED complaining of left leg swelling and pain for about 1-2 weeks.   These symptoms had been progressively worsening and eventually got to the point where she couldn't walk due to the pain.  She denied any inciting event such as trauma, change in diet, long car ride, recent surgery or change in activity.  She did notice a blue/grayish discoloration to the leg and due to the pain, she presented to the ED.  She also has a history of osteoarthritis.  She denied any chest pain, SOB, palpitations, DOE, headache, GI disturbance or abdominal pain.  She does not smoke or use ETOH.    For further PMH, PSH, meds, allergies and ROS, please see resident note.    Physical Exam: Blood pressure 121/70, pulse 64, temperature 97.2 F (36.2 C), temperature source Oral, resp. rate 20, height 5\' 5"  (1.651 m), weight 227 lb (102.967 kg), SpO2 95.00%. General appearance: alert, cooperative and appears stated age Head: Normocephalic, without obvious abnormality, atraumatic Eyes: anicteric sclerae Lungs: clear to auscultation bilaterally and no wheezing Heart: NR, RR, no murmur noted Abdomen: soft, NT, ND, +BS throughout Extremities: Left leg with erythema to mid thigh, tense edema which is pitting, tenderness 6-7/10 on the pain scale, no rash.  + TTP over left knee; no apparent effusion, though difficult to tell with swelling.  + varicosities to both legs.  Right leg without erythema or swelling.  Pulses: 1+ diminished in both LE at [facility], 2+ at radial  Skin: no rash or wound, erythema as noted above Neurologic: Grossly normal  Lab results: Results for orders placed during the hospital encounter of  09/08/12 (from the past 24 hour(s))  HEPARIN LEVEL (UNFRACTIONATED)     Status: Abnormal   Collection Time    09/08/12  3:44 PM      Result Value Range   Heparin Unfractionated 0.22 (*) 0.30 - 0.70 IU/mL  GLUCOSE, CAPILLARY     Status: Abnormal   Collection Time    09/08/12  4:52 PM      Result Value Range   Glucose-Capillary 108 (*) 70 - 99 mg/dL  URINALYSIS, ROUTINE W REFLEX MICROSCOPIC     Status: Abnormal   Collection Time    09/08/12  5:09 PM      Result Value Range   Color, Urine AMBER (*) YELLOW   APPearance HAZY (*) CLEAR   Specific Gravity, Urine 1.023  1.005 - 1.030   pH 5.0  5.0 - 8.0   Glucose, UA NEGATIVE  NEGATIVE mg/dL   Hgb urine dipstick NEGATIVE  NEGATIVE   Bilirubin Urine NEGATIVE  NEGATIVE   Ketones, ur NEGATIVE  NEGATIVE mg/dL   Protein, ur NEGATIVE  NEGATIVE mg/dL   Urobilinogen, UA 0.2  0.0 - 1.0 mg/dL   Nitrite NEGATIVE  NEGATIVE   Leukocytes, UA MODERATE (*) NEGATIVE  URINE MICROSCOPIC-ADD ON     Status: Abnormal   Collection Time    09/08/12  5:09 PM      Result Value Range   Squamous Epithelial / LPF FEW (*) RARE   WBC, UA 21-50  <3 WBC/hpf   Bacteria, UA FEW (*) RARE   Casts HYALINE  CASTS (*) NEGATIVE   Urine-Other MUCOUS PRESENT    GLUCOSE, CAPILLARY     Status: Abnormal   Collection Time    09/08/12 10:09 PM      Result Value Range   Glucose-Capillary 115 (*) 70 - 99 mg/dL  HEPARIN LEVEL (UNFRACTIONATED)     Status: Abnormal   Collection Time    09/09/12 12:58 AM      Result Value Range   Heparin Unfractionated 0.20 (*) 0.30 - 0.70 IU/mL  CBC     Status: Abnormal   Collection Time    09/09/12  4:25 AM      Result Value Range   WBC 12.1 (*) 4.0 - 10.5 K/uL   RBC 4.01  3.87 - 5.11 MIL/uL   Hemoglobin 11.9 (*) 12.0 - 15.0 g/dL   HCT 40.9  81.1 - 91.4 %   MCV 90.0  78.0 - 100.0 fL   MCH 29.7  26.0 - 34.0 pg   MCHC 33.0  30.0 - 36.0 g/dL   RDW 78.2  95.6 - 21.3 %   Platelets 227  150 - 400 K/uL  PROTIME-INR     Status: None    Collection Time    09/09/12  4:25 AM      Result Value Range   Prothrombin Time 13.7  11.6 - 15.2 seconds   INR 1.06  0.00 - 1.49  GLUCOSE, CAPILLARY     Status: Abnormal   Collection Time    09/09/12  7:45 AM      Result Value Range   Glucose-Capillary 110 (*) 70 - 99 mg/dL   Comment 1 Notify RN     Comment 2 Documented in Chart    HEPARIN LEVEL (UNFRACTIONATED)     Status: None   Collection Time    09/09/12 11:26 AM      Result Value Range   Heparin Unfractionated 0.34  0.30 - 0.70 IU/mL    Imaging results:  Dg Chest Port 1 View  09/08/2012  *RADIOLOGY REPORT*  Clinical Data: Preoperative respiratory exam.  Deep venous thrombosis of the left leg.  PORTABLE CHEST - 1 VIEW  Comparison: 10/20/2009  Findings: The heart size and pulmonary vascularity are normal and the lungs are clear.  CABG.  No acute osseous abnormality.  IMPRESSION: No acute disease.   Original Report Authenticated By: Francene Boyers, M.D.     Assessment and Plan: I agree with the formulated Assessment and Plan with the following changes:   1. Acute LLE DVT - No signs/symptoms of progression of clot, physical exam improved today - Vascular surgery consulted, recommendations noted - Heparin drip in place with coumadin started per pharmacy - PT tomorrow to assess ability to walk/take care of herself at home.  - Source unclear, no apparent provoking factors; will make sure she is up to date on all of her age appropriate cancer screening.    2. Acute fever, elevated WBC - Unclear if this is a true infection or acute phase reactant to the acute extensive DVT - Urine studies are pending - Consider BC X 2 if spikes another fever - no abx for now  Other issues as per resident note.     Rachel Catalina, MD 3/1/201412:08 PM

## 2012-09-09 NOTE — Progress Notes (Signed)
ANTICOAGULATION CONSULT NOTE - Follow Up Consult  Pharmacy Consult for heparin Indication: DVT  Labs:  Recent Labs  09/08/12 0706 09/08/12 1544 09/09/12 0058  HGB 12.3  --   --   HCT 36.0  --   --   PLT 246  --   --   LABPROT 13.2  --   --   INR 1.01  --   --   HEPARINUNFRC  --  0.22* 0.20*  CREATININE 0.80  --   --     Assessment: 77yo female remains subtherapeutic on heparin with lower level despite increased rate; no infusion issues per RN.  Goal of Therapy:  Heparin level 0.3-0.7 units/ml   Plan:  Will increase heparin gtt by 3 units/kg/hr to 1600 units/hr and check level in 6-8hr.  Colleen Can PharmD BCPS 09/09/2012,2:33 AM

## 2012-09-09 NOTE — Progress Notes (Signed)
Nutrition Brief Note  Patient identified on the Malnutrition Screening Tool (MST) Report for recent weight loss and poor intake.  From review of usual weights in EMR, patient has not lost a significant amount of weight.  PO intake of meals is adequate.  Wt Readings from Last 10 Encounters:  09/08/12 227 lb (102.967 kg)  09/06/12 227 lb (102.967 kg)  11/19/11 226 lb (102.513 kg)  04/22/11 231 lb (104.781 kg)  10/23/10 232 lb (105.235 kg)  06/02/10 233 lb (105.688 kg)  04/24/10 231 lb (104.781 kg)  09/30/09 231 lb (104.781 kg)  05/08/09 225 lb (102.059 kg)  10/18/08 228 lb (103.42 kg)    Body mass index is 37.77 kg/(m^2). Patient meets criteria for obesity, class 2 based on current BMI.   Current diet order is Heart Healthy, patient is consuming approximately 85-100% of meals at this time. Labs and medications reviewed.   No nutrition interventions warranted at this time. If nutrition issues arise, please consult RD.   Joaquin Courts, RD, LDN, CNSC Pager# [phone] After Hours Pager# [phone]

## 2012-09-09 NOTE — Progress Notes (Signed)
ANTICOAGULATION CONSULT NOTE - Follow Up Consult  Pharmacy Consult for Coumadin and Heparin  Indication: DVT  Allergies  Allergen Reactions  . Niaspan (Niacin Er)     Severe flushing  . Nitroglycerin Other (See Comments)    Blood pressure drops down to low    Patient Measurements: Height: 5\' 5"  (165.1 cm) Weight: 227 lb (102.967 kg) IBW/kg (Calculated) : 57 Heparin Dosing Weight: 81kg  Vital Signs: Temp: 97.2 F (36.2 C) (03/01 0800) Temp src: Oral (03/01 0800) BP: 121/70 mmHg (03/01 1000) Pulse Rate: 64 (03/01 1000)  Labs:  Recent Labs  09/08/12 0706 09/08/12 1544 09/09/12 0058 09/09/12 0425 09/09/12 1126  HGB 12.3  --   --  11.9*  --   HCT 36.0  --   --  36.1  --   PLT 246  --   --  227  --   LABPROT 13.2  --   --  13.7  --   INR 1.01  --   --  1.06  --   HEPARINUNFRC  --  0.22* 0.20*  --  0.34  CREATININE 0.80  --   --   --   --     Estimated Creatinine Clearance: 73.4 ml/min (by C-G formula based on Cr of 0.8).  Assessment: 76yof on heparin bridged with Coumadin D#2/5 for acute LLE DVT - Baseline INR: 1.01 - H/H and Plts wnl - No significant bleeding reported - Warfarin points: 4  HL therapeutic this morning at 0.34. INR was subtherapeutic at 1.06.   Goal of Therapy:  INR 2-3 Monitor platelets by anticoagulation protocol: Yes Heparin level goal 0.3-0.7  Plan:  1. Coumadin 7.5mg  po x 1 today 2. Continue heparin 1600 units/hr  3. Follow-up 2100 heparin level 4. Daily INR, heparin level and CBC  Jerry Haugen, Pharm.D. Clinical Pharmacist   Pager: [phone] 09/09/2012 12:59 PM

## 2012-09-09 NOTE — Progress Notes (Addendum)
Subjective: Patient c/o right knee pain 7-8/10. Denies dysuria.  No other complaints.    Leg swelling x 1-2 weeks worsening x 1 week. Tried Aspirin at home for pain.  Decreased ambulation due to pain and swelling.  Sandria Senter but thought she was allergic to [facility] because leg swelling was worse so she came to the ED to get examined  Denies prior DVT, smoking history, long travel or trauma.  On admission reported decreased energy x 2 weeks.    Objective: Vital signs in last 24 hours: Filed Vitals:   09/09/12 0209 09/09/12 0614 09/09/12 0800 09/09/12 1000  BP: 97/63 110/56 110/67 121/70  Pulse: 65 67 72 64  Temp:  98.2 F (36.8 C) 97.2 F (36.2 C)   TempSrc:  Oral Oral   Resp: 20 20 20    Height:      Weight:      SpO2: 94% 90% 94% 95%   Weight change:   Intake/Output Summary (Last 24 hours) at 09/09/12 1053 Last data filed at 09/09/12 0900  Gross per 24 hour  Intake 1430.93 ml  Output   1150 ml  Net 280.93 ml    Physical Exam:  General: lying in bed, talkative, nad, alert and oriented to person, location HEENT: [place]/at CV: RRR no murmurs, multiple surgical scars to chest  Lungs: ctab Abdomen: obese, soft, ntnd, nl bs Extremities: left lower extremity with resolved cyanosis diminished pulses, warm; right lower extremity warm, no cyanosis or edema with pulses intact; LLE sig more swollen than right. No knee effusion grossly palpated on the left.  ROM still intact though slightly decreased  Neuro: moving all 4 extremities  Lab Results: Basic Metabolic Panel:  Recent Labs Lab 09/08/12 0706  NA 135  K 4.0  CL 101  CO2 23  GLUCOSE 188*  BUN 19  CREATININE 0.80  CALCIUM 10.1   Liver Function Tests:  Recent Labs Lab 09/06/12 1139  AST 24  ALT 23  ALKPHOS 28*  BILITOT 0.6  PROT 6.8  ALBUMIN 3.6   CBC:  Recent Labs Lab 09/08/12 0706 09/09/12 0425  WBC 13.6* 12.1*  HGB 12.3 11.9*  HCT 36.0 36.1  MCV 88.7 90.0  PLT 246 227   CBG:  Recent Labs Lab  09/08/12 1117 09/08/12 1652 09/08/12 2209 09/09/12 0745  GLUCAP 136* 108* 115* 110*   Fasting Lipid Panel:  Recent Labs Lab 09/06/12 1139  CHOL 127  HDL 37.30*  LDLCALC 67  TRIG 113.0  CHOLHDL 3   Coagulation:  Recent Labs Lab 09/08/12 0706 09/09/12 0425  LABPROT 13.2 13.7  INR 1.01 1.06   Urinalysis:  Recent Labs Lab 09/08/12 1709  COLORURINE AMBER*  LABSPEC 1.023  PHURINE 5.0  GLUCOSEU NEGATIVE  HGBUR NEGATIVE  BILIRUBINUR NEGATIVE  KETONESUR NEGATIVE  PROTEINUR NEGATIVE  UROBILINOGEN 0.2  NITRITE NEGATIVE  LEUKOCYTESUR MODERATE*   Misc. Labs: None   Studies/Results: Dg Chest Port 1 View  09/08/2012  *RADIOLOGY REPORT*  Clinical Data: Preoperative respiratory exam.  Deep venous thrombosis of the left leg.  PORTABLE CHEST - 1 VIEW  Comparison: 10/20/2009  Findings: The heart size and pulmonary vascularity are normal and the lungs are clear.  CABG.  No acute osseous abnormality.  IMPRESSION: No acute disease.   Original Report Authenticated By: Francene Boyers, M.D.    Left lower extremity duplex 09/08/12:  Left: DVT noted from the CFV through the PTV and Peroneal v including the gastrocnemius veins. Superficial thrombosis of the GSV from the SFJ through the  mid thigh. 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 Right CFV patent. Right CFA waveform triphasic.     Medications: Scheduled Meds: . aspirin  325 mg Oral QHS  . atorvastatin  40 mg Oral q1800  . fenofibrate  160 mg Oral Daily  . FLUoxetine  20 mg Oral Daily  . levothyroxine  100 mcg Oral QAC breakfast  . losartan  50 mg Oral Daily  . metoprolol  50 mg Oral BID  . niacin  500 mg Oral QHS  . omega-3 acid ethyl esters  1 g Oral Daily  . oxybutynin  5 mg Oral Q6H  . pantoprazole  40 mg Oral Daily  . sodium chloride  3 mL Intravenous Q12H  . sodium chloride  3 mL Intravenous Q12H  . vitamin C  500 mg Oral Daily  . vitamin E  1,000 Units Oral Daily  . warfarin  7.5  mg Oral ONCE-1800  . Warfarin - Pharmacist Dosing Inpatient   Does not apply q1800   Continuous Infusions: . heparin 1,600 Units/hr (09/09/12 0235)   PRN Meds:.sodium chloride, acetaminophen, acetaminophen, ALPRAZolam, morphine injection, sodium chloride Assessment/Plan: 77 year old presented with left lower extremity swelling, pain, decreased ambulation and cyanosis found to have extensively unprovoked appearing DVT.   1. Acute Extensive Left lower extremity DVT -cyanosis improved today -Heparin gtt, Coumadin per pharmacy -Monitor INR goal 2-3. INR 1.06 today -PT/OT tomorrow  -prn morphine or Tylenol for pain  -Neurovascular checks q6  -unclear etiology (no trauma, no immobility, no travel)  -will need outpatient w/u for malignancy i.e repeat colonoscopy, upcoming mammogram. Consider hypercoagulable w/u in the future  2. Fever -Tmax 101 in 24 hours  -pending urine culture to follow. Patient denies dysuria though with recent history of UTI. CXR normal  -monitor VS   3. Leukocytosis  -improving  Continue medications for chronic medical conditions.    Dispo: Disposition is deferred at this time, awaiting improvement of current medical problems.  Anticipated discharge in approximately 2-3 day(s).   The patient does have a current PCP Larina Bras, Harriett Sine, NP), therefore will be requiring OPC follow-up after discharge.   The patient does have transportation limitations that hinder transportation to clinic appointments.  .Services Needed at time of discharge: Y = Yes, Blank = No PT:   OT:   RN:   Equipment:   Other:     LOS: 1 day   [name] [phone] 09/09/2012, 10:53 AM

## 2012-09-09 NOTE — Progress Notes (Signed)
Heparin drip was off from 21:41 until 22:07 (26 minutes) d/t IV pulled out. Pharmacy notified. Steele Berg RN

## 2012-09-10 DIAGNOSIS — I80299 Phlebitis and thrombophlebitis of other deep vessels of unspecified lower extremity: Secondary | ICD-10-CM

## 2012-09-10 LAB — GLUCOSE, CAPILLARY
Glucose-Capillary: 106 mg/dL — ABNORMAL HIGH (ref 70–99)
Glucose-Capillary: 107 mg/dL — ABNORMAL HIGH (ref 70–99)

## 2012-09-10 LAB — CBC
Hemoglobin: 11.8 g/dL — ABNORMAL LOW (ref 12.0–15.0)
MCH: 29.9 pg (ref 26.0–34.0)
MCHC: 33.6 g/dL (ref 30.0–36.0)

## 2012-09-10 LAB — HEPARIN LEVEL (UNFRACTIONATED): Heparin Unfractionated: 0.45 IU/mL (ref 0.30–0.70)

## 2012-09-10 MED ORDER — HYDROCODONE-ACETAMINOPHEN 5-325 MG PO TABS
1.0000 | ORAL_TABLET | ORAL | Status: DC | PRN
  Administered 2012-09-10: 2 via ORAL
  Administered 2012-09-10 – 2012-09-11 (×2): 1 via ORAL
  Administered 2012-09-12: 2 via ORAL
  Administered 2012-09-12 (×2): 1 via ORAL
  Filled 2012-09-10: qty 2
  Filled 2012-09-10 (×2): qty 1
  Filled 2012-09-10 (×3): qty 2

## 2012-09-10 MED ORDER — WARFARIN SODIUM 6 MG PO TABS
6.0000 mg | ORAL_TABLET | Freq: Once | ORAL | Status: AC
  Administered 2012-09-10: 6 mg via ORAL
  Filled 2012-09-10: qty 1

## 2012-09-10 NOTE — Progress Notes (Signed)
Attempting to keep pt's Left leg/foot elevated on pillows. Yet every time I enter her room, the pillows have been moved or are on the floor beside pt's bed. Pt needs continual reminding to keep the extremity elevated. Pt says she "forgets". Reeducated on the importance and reason for keep extremity elevated. Pt verbalized understanding. Extremity re-elevated. Will continue to monitor. [place], Rosanna Randy

## 2012-09-10 NOTE — Progress Notes (Signed)
ANTICOAGULATION CONSULT NOTE - Follow Up Consult  Pharmacy Consult for Coumadin and Heparin  Indication: DVT  Allergies  Allergen Reactions  . Niaspan (Niacin Er)     Severe flushing  . Nitroglycerin Other (See Comments)    Blood pressure drops down to low    Patient Measurements: Height: 5\' 5"  (165.1 cm) Weight: 227 lb (102.967 kg) IBW/kg (Calculated) : 57 Heparin Dosing Weight: 81kg  Vital Signs: Temp: 98.3 F (36.8 C) (03/02 0800) Temp src: Oral (03/02 0800) BP: 145/59 mmHg (03/02 0800) Pulse Rate: 71 (03/02 0800)  Labs:  Recent Labs  09/08/12 0706  09/09/12 0058 09/09/12 0425 09/09/12 1126 09/10/12 0710  HGB 12.3  --   --  11.9*  --  11.8*  HCT 36.0  --   --  36.1  --  35.1*  PLT 246  --   --  227  --  258  LABPROT 13.2  --   --  13.7  --  16.9*  INR 1.01  --   --  1.06  --  1.41  HEPARINUNFRC  --   < > 0.20*  --  0.34 0.45  CREATININE 0.80  --   --   --   --   --   < > = values in this interval not displayed.  Estimated Creatinine Clearance: 73.4 ml/min (by C-G formula based on Cr of 0.8).  Assessment: 76yof on Hep/Warf bridge D#3/5 for acute DVT, however patient has pulled IV out last night so heparin was off for ~ . Restarted 3/1 @ 2207. BL INR 1.01, h/h and plts wnl, no bleeding reported, Hep Wt 81kg, Warf pts: 4. HL therapeutic at 0.34 yesterday but, again, patient pulled IV heparin out. HL rechecked this am--therapeutic at 0.45. INR this am has increased a good amount but still subtherapeutic at 1.41  Goal of Therapy:  INR 2-3 Monitor platelets by anticoagulation protocol: Yes Heparin level goal 0.3-0.7  Plan:  1. Coumadin 6mg  po x 1 today 2. Continue heparin 1600 units/hr  3. Daily INR and CBC 4. Monitor for signs of bleeding   Thank you,  Franchot Erichsen, Pharm.D. Clinical Pharmacist   Pager: [phone] 09/10/2012 10:30 AM

## 2012-09-10 NOTE — Evaluation (Signed)
Physical Therapy Evaluation Patient Details Name: Rachel Conner MRN: [id number] DOB: Mar 31, 1936 Today's Date: 09/10/2012 Time: 1191-4782 PT Time Calculation (min): 31 min  PT Assessment / Plan / Recommendation Clinical Impression  Patient is a 77 yo female admitted with LLE DVT.  Mobility limited by pain.  Will benefit from acute PT to maximize independence/safety with mobility prior to return home.  Recommend HHPT at discharge.    PT Assessment  Patient needs continued PT services    Follow Up Recommendations  Home health PT;Supervision/Assistance - 24 hour    Does the patient have the potential to tolerate intense rehabilitation      Barriers to Discharge None      Equipment Recommendations  None recommended by PT    Recommendations for Other Services     Frequency Min 4X/week    Precautions / Restrictions Precautions Precautions: Fall Restrictions Weight Bearing Restrictions: No   Pertinent Vitals/Pain Pain 8/10 impacting mobility/gait.      Mobility  Bed Mobility Bed Mobility: Rolling Left;Left Sidelying to Sit;Sit to Supine Rolling Left: 4: Min guard;With rail Left Sidelying to Sit: 4: Min assist;HOB elevated;With rails Sit to Supine: 4: Min assist;HOB flat;With rail Details for Bed Mobility Assistance: Verbal cues for technique.  Assist to raise trunk off of bed, and to bring LLE onto bed. Transfers Transfers: Sit to Stand;Stand to Sit Sit to Stand: 4: Min guard;With upper extremity assist;From bed Stand to Sit: 4: Min guard;With upper extremity assist;To bed Details for Transfer Assistance: Verbal cues for hand placement.  Assist for safety. Ambulation/Gait Ambulation/Gait Assistance: 4: Min assist Ambulation Distance (Feet): 62 Feet Assistive device: Rolling walker Ambulation/Gait Assistance Details: Verbal cues for safe use of RW.  Assist to maneuver RW during turns - patient lifting RW into air and turning it > 90 degrees.  Encouraged slow small steps  during turns. Gait Pattern: Step-to pattern;Decreased stance time - left;Decreased step length - right;Antalgic;Trunk flexed Gait velocity: Moving too quickly.  Encouraged patient to move slower for safety.    Exercises     PT Diagnosis: Difficulty walking;Abnormality of gait;Acute pain  PT Problem List: Decreased strength;Decreased activity tolerance;Decreased balance;Decreased mobility;Decreased knowledge of use of DME;Decreased safety awareness;Pain PT Treatment Interventions: DME instruction;Gait training;Functional mobility training;Patient/family education;Cognitive remediation   PT Goals Acute Rehab PT Goals PT Goal Formulation: With patient/family Time For Goal Achievement: 09/17/12 Potential to Achieve Goals: Good Pt will go Supine/Side to Sit: Independently;with HOB 0 degrees PT Goal: Supine/Side to Sit - Progress: Goal set today Pt will go Sit to Supine/Side: Independently;with HOB 0 degrees PT Goal: Sit to Supine/Side - Progress: Goal set today Pt will go Sit to Stand: with supervision;with upper extremity assist PT Goal: Sit to Stand - Progress: Goal set today Pt will Ambulate: 51 - 150 feet;with supervision;with rolling walker (with good balance) PT Goal: Ambulate - Progress: Goal set today  Visit Information  Last PT Received On: 09/10/12 Assistance Needed: +1    Subjective Data  Subjective: Patient very talkative.  Rambles off subject. Patient Stated Goal: To decrease pain.  To go home.   Prior Functioning  Home Living Lives With: Spouse Available Help at Discharge: Family;Available 24 hours/day Type of Home: House Home Access: Level entry Home Layout: One level Bathroom Shower/Tub: Engineer, manufacturing systems: Standard Bathroom Accessibility: Yes How Accessible: Accessible via walker Home Adaptive Equipment: Straight cane;Walker - rolling;Bedside commode/3-in-1;Shower chair with back Prior Function Level of Independence: Independent with assistive  device(s) Able to Take Stairs?: Yes Driving: Yes  Vocation: Retired Musician: No difficulties    Copywriter, advertising Overall Cognitive Status: Appears within functional limits for tasks assessed/performed Arousal/Alertness: Awake/alert Orientation Level: Appears intact for tasks assessed Behavior During Session: [facility] for tasks performed Cognition - Other Comments: Patient with decreased safety awareness - premorbid per daughter.    Extremity/Trunk Assessment Right Upper Extremity Assessment RUE ROM/Strength/Tone: WFL for tasks assessed RUE Sensation: WFL - Light Touch Left Upper Extremity Assessment LUE ROM/Strength/Tone: WFL for tasks assessed LUE Sensation: WFL - Light Touch Right Lower Extremity Assessment RLE ROM/Strength/Tone: WFL for tasks assessed RLE Sensation: WFL - Light Touch Left Lower Extremity Assessment LLE ROM/Strength/Tone: Deficits LLE ROM/Strength/Tone Deficits: Strength grossly 3/5 - difficult to assess due to pain   Balance Balance Balance Assessed: Yes Static Sitting Balance Static Sitting - Balance Support: No upper extremity supported;Feet supported Static Sitting - Level of Assistance: 5: Stand by assistance Static Sitting - Comment/# of Minutes: 10 Static Standing Balance Static Standing - Balance Support: Bilateral upper extremity supported Static Standing - Level of Assistance: 5: Stand by assistance Static Standing - Comment/# of Minutes: 3  End of Session PT - End of Session Equipment Utilized During Treatment: Gait belt Activity Tolerance: Patient limited by pain;Patient limited by fatigue Patient left: in bed;with call bell/phone within reach;with family/visitor present (with FOB and LLE elevated) Nurse Communication: Mobility status  GP     Vena Austria 09/10/2012, 2:53 PM  Durenda Hurt. Renaldo Fiddler, [facility] Acute Rehab Services Pager [phone]

## 2012-09-10 NOTE — Progress Notes (Signed)
Patient ID: Rachel Conner, female   DOB: January 26, 1936, 77 y.o.   MRN: [id number] Vascular Surgery Progress Note  Subjective: Patient with extensive DVT left leg throughout and extensive edema Currently on heparin and plan for chronic anti-coagulation  Objective:  Filed Vitals:   09/10/12 0800  BP: 145/59  Pulse: 71  Temp: 98.3 F (36.8 C)  Resp: 20    General alert and oriented x3 2+ edema left leg improved from admission Adequate perfusion left foot   Labs:  Recent Labs Lab 09/08/12 0706  CREATININE 0.80    Recent Labs Lab 09/08/12 0706  NA 135  K 4.0  CL 101  CO2 23  BUN 19  CREATININE 0.80  GLUCOSE 188*  CALCIUM 10.1    Recent Labs Lab 09/08/12 0706 09/09/12 0425 09/10/12 0710  WBC 13.6* 12.1* 12.1*  HGB 12.3 11.9* 11.8*  HCT 36.0 36.1 35.1*  PLT 246 227 258    Recent Labs Lab 09/08/12 0706 09/09/12 0425 09/10/12 0710  INR 1.01 1.06 1.41    I/O last 3 completed shifts: In: 1707.5 [P.O.:1320; I.V.:387.5] Out: 2300 [Urine:2300]  Imaging: No results found.  Assessment/Plan:  POD #2   LOS: 2 days  s/p   Patient needs elevation of foot of bed at 20 to help improve edema Patient also needs to be fitted for a long leg elastic compression stocking 20-30 mm gradient and needs to apply stocking early in a.m. prior to arising to help decrease chronic edema Would recommend chronic anticoagulation for 6 months  Josephina Gip, MD 09/10/2012 9:56 AM

## 2012-09-10 NOTE — Progress Notes (Signed)
Subjective: No acute events overnight, but heparin drip was off for about 26 min d/t IV being pulled out. Patient without complaints and denies CP/SOB/HA/dizziness  Objective: Vital signs in last 24 hours: Filed Vitals:   09/09/12 1740 09/09/12 2044 09/10/12 0543 09/10/12 0800  BP: 126/55 141/67 155/42 145/59  Pulse: 76 75 67 71  Temp:  99.4 F (37.4 C) 99.1 F (37.3 C) 98.3 F (36.8 C)  TempSrc: Oral Oral Oral Oral  Resp: 20 20 20 20   Height:      Weight:      SpO2: 97% 97% 99% 99%   Weight change:   Intake/Output Summary (Last 24 hours) at 09/10/12 1044 Last data filed at 09/10/12 0830  Gross per 24 hour  Intake   1032 ml  Output   1475 ml  Net   -443 ml   General: lying in bed, talkative, nad, alert and oriented to person, location  CV: RRR no murmurs, multiple surgical scars to chest  Lungs: CTA b/l without wheezing/rales Abdomen: obese, soft, NT/ND, normoactive BS Extremities: left lower extremity with resolved cyanosis diminished pulses, warm; right lower extremity warm, no cyanosis or edema with pulses intact; LLE sig more swollen than right. No knee effusion grossly palpated on the left. ROM still intact though slightly decreased  Neuro: moving all 4 extremities  Lab Results: Basic Metabolic Panel:  Recent Labs Lab 09/08/12 0706  NA 135  K 4.0  CL 101  CO2 23  GLUCOSE 188*  BUN 19  CREATININE 0.80  CALCIUM 10.1   Liver Function Tests:  Recent Labs Lab 09/06/12 1139  AST 24  ALT 23  ALKPHOS 28*  BILITOT 0.6  PROT 6.8  ALBUMIN 3.6   CBC:  Recent Labs Lab 09/09/12 0425 09/10/12 0710  WBC 12.1* 12.1*  HGB 11.9* 11.8*  HCT 36.1 35.1*  MCV 90.0 89.1  PLT 227 258   CBG:  Recent Labs Lab 09/08/12 2209 09/09/12 0745 09/09/12 1154 09/09/12 1629 09/09/12 2116 09/10/12 0715  GLUCAP 115* 110* 126* 113* 115* 106*   Coagulation:  Recent Labs Lab 09/08/12 0706 09/09/12 0425 09/10/12 0710  LABPROT 13.2 13.7 16.9*  INR 1.01 1.06 1.41     Medications: I have reviewed the patient's current medications. Scheduled Meds: . aspirin  325 mg Oral QHS  . atorvastatin  40 mg Oral q1800  . fenofibrate  160 mg Oral Daily  . FLUoxetine  20 mg Oral Daily  . levothyroxine  100 mcg Oral QAC breakfast  . losartan  50 mg Oral Daily  . metoprolol  50 mg Oral BID  . omega-3 acid ethyl esters  1 g Oral Daily  . oxybutynin  5 mg Oral Q6H  . pantoprazole  40 mg Oral Daily  . sodium chloride  3 mL Intravenous Q12H  . sodium chloride  3 mL Intravenous Q12H  . vitamin C  500 mg Oral Daily  . vitamin E  1,000 Units Oral Daily  . warfarin  6 mg Oral ONCE-1800  . Warfarin - Pharmacist Dosing Inpatient   Does not apply q1800   Continuous Infusions: . heparin 1,600 Units/hr (09/09/12 2207)   PRN Meds:.sodium chloride, acetaminophen, acetaminophen, ALPRAZolam, morphine injection, sodium chloride  Assessment/Plan: 77 year old presented with left lower extremity swelling, pain, decreased ambulation and cyanosis found to have extensively unprovoked appearing DVT.   # Acute Extensive Left lower extremity DVT  -cyanosis significantly improved today  -Heparin gtt, Coumadin per pharmacy  -Monitor INR goal 2-3. INR 1.4  today  -PT/OT pending -change morphine to norco to transition for outpatient pain mgmt  -Neurovascular checks q6  -unclear etiology (no trauma, no immobility, no travel)  -will need outpatient w/u for malignancy i.e repeat colonoscopy, upcoming mammogram. Consider hypercoagulable w/u in the future  -Appreciate vascular recs - 6 month anticoag at min  # Fever  -afebrile in 24 hours; likely d/t acute phase reactant  -urine cx only 35000 colonies of multiple bacterial morphocytes. Patient denies dysuria though with recent history of UTI. CXR normal  -monitor VS   # Leukocytosis  -stable  #HTN: continue home metoprolol, losartan  #Hypothyroidism: cont home synthroid  #Depression: on home xanax & prozac  #HLD: on home  lipitor, lovaza & fenofibrate  Dispo: Disposition is deferred at this time, awaiting improvement of current medical problems.  Anticipated discharge in approximately 1-2 day(s).   The patient does have a current PCP Larina Bras, Harriett Sine, NP), therefore will not be requiring OPC follow-up after discharge.   The patient does not have transportation limitations that hinder transportation to clinic appointments.  .Services Needed at time of discharge: Y = Yes, Blank = No PT:   OT:   RN:   Equipment:   Other:     LOS: 2 days   SHARDA, NEEMA 09/10/2012, 10:44 AM

## 2012-09-11 LAB — CBC
HCT: 35.7 % — ABNORMAL LOW (ref 36.0–46.0)
Hemoglobin: 11.6 g/dL — ABNORMAL LOW (ref 12.0–15.0)
MCH: 29.1 pg (ref 26.0–34.0)
RBC: 3.98 MIL/uL (ref 3.87–5.11)

## 2012-09-11 LAB — PROTIME-INR
INR: 1.77 — ABNORMAL HIGH (ref 0.00–1.49)
Prothrombin Time: 20 s — ABNORMAL HIGH (ref 11.6–15.2)

## 2012-09-11 LAB — HEPARIN LEVEL (UNFRACTIONATED): Heparin Unfractionated: 0.41 [IU]/mL (ref 0.30–0.70)

## 2012-09-11 LAB — GLUCOSE, CAPILLARY
Glucose-Capillary: 130 mg/dL — ABNORMAL HIGH (ref 70–99)
Glucose-Capillary: 111 mg/dL — ABNORMAL HIGH (ref 70–99)

## 2012-09-11 MED ORDER — POLYETHYLENE GLYCOL 3350 17 G PO PACK
17.0000 g | PACK | Freq: Once | ORAL | Status: AC
  Administered 2012-09-11: 17 g via ORAL
  Filled 2012-09-11: qty 1

## 2012-09-11 MED ORDER — WARFARIN SODIUM 6 MG PO TABS
6.0000 mg | ORAL_TABLET | Freq: Once | ORAL | Status: AC
  Administered 2012-09-11: 6 mg via ORAL
  Filled 2012-09-11: qty 1

## 2012-09-11 NOTE — Progress Notes (Signed)
[facility]  Patient Status: New  [facility] is providing the following services: RN, PT and OT  If patient discharges after hours, please call [phone].   Rachel Conner 09/11/2012, 4:11 PM

## 2012-09-11 NOTE — Progress Notes (Signed)
Subjective: Patient denies complaints this am. She has not had a bowel movement in 3-4 days.  Declines prune juice but okay with another option.   Objective: Vital signs in last 24 hours: Filed Vitals:   09/10/12 1820 09/10/12 2100 09/11/12 0503 09/11/12 0935  BP: 133/64 110/55 109/57 122/58  Pulse: 81 70 59 66  Temp: 98.7 F (37.1 C) 98.8 F (37.1 C) 98 F (36.7 C)   TempSrc: Oral Oral Oral   Resp: 20 20 18 18   Height:  5\' 5"  (1.651 m)    Weight:  231 lb 4.2 oz (104.9 kg)    SpO2: 99% 98% 94% 91%   Weight change:   Intake/Output Summary (Last 24 hours) at 09/11/12 1012 Last data filed at 09/11/12 0934  Gross per 24 hour  Intake    724 ml  Output    775 ml  Net    -51 ml    Physical Exam:  General: lying in bed, nad, alert and oriented x 3 HEENT: [place]/at CV: RRR no murmurs, multiple scars to chest  Lungs: ctab Abdomen: obese, soft, ntnd, nl bs  Extremities: LLE edematous, warm, no cyanosis, RLE-normal, warm, no cyanosis or edema Neuro: moving all 4 extremities, alert and oriented x 4   Lab Results: Basic Metabolic Panel:  Recent Labs Lab 09/08/12 0706  NA 135  K 4.0  CL 101  CO2 23  GLUCOSE 188*  BUN 19  CREATININE 0.80  CALCIUM 10.1   Liver Function Tests:  Recent Labs Lab 09/06/12 1139  AST 24  ALT 23  ALKPHOS 28*  BILITOT 0.6  PROT 6.8  ALBUMIN 3.6   CBC:  Recent Labs Lab 09/10/12 0710 09/11/12 0942  WBC 12.1* 8.7  HGB 11.8* 11.6*  HCT 35.1* 35.7*  MCV 89.1 89.7  PLT 258 263   CBG:  Recent Labs Lab 09/09/12 1629 09/09/12 2116 09/10/12 0715 09/10/12 1152 09/10/12 2105 09/11/12 0725  GLUCAP 113* 115* 106* 107* 109* 97   Fasting Lipid Panel:  Recent Labs Lab 09/06/12 1139  CHOL 127  HDL 37.30*  LDLCALC 67  TRIG 113.0  CHOLHDL 3   Coagulation:  Recent Labs Lab 09/08/12 0706 09/09/12 0425 09/10/12 0710 09/11/12 0507  LABPROT 13.2 13.7 16.9* 20.0*  INR 1.01 1.06 1.41 1.77*   Urinalysis:  Recent Labs Lab  09/08/12 1709  COLORURINE AMBER*  LABSPEC 1.023  PHURINE 5.0  GLUCOSEU NEGATIVE  HGBUR NEGATIVE  BILIRUBINUR NEGATIVE  KETONESUR NEGATIVE  PROTEINUR NEGATIVE  UROBILINOGEN 0.2  NITRITE NEGATIVE  LEUKOCYTESUR MODERATE*   Misc. Labs: None   Studies/Results: Left lower extremity duplex 09/08/12:  Left: DVT noted from the CFV through the PTV and Peroneal v including the gastrocnemius veins. Superficial thrombosis of the GSV from the SFJ through the mid thigh. 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 Right CFV patent. Right CFA waveform triphasic.     Medications: Scheduled Meds: . aspirin  325 mg Oral QHS  . atorvastatin  40 mg Oral q1800  . fenofibrate  160 mg Oral Daily  . FLUoxetine  20 mg Oral Daily  . levothyroxine  100 mcg Oral QAC breakfast  . losartan  50 mg Oral Daily  . metoprolol  50 mg Oral BID  . omega-3 acid ethyl esters  1 g Oral Daily  . oxybutynin  5 mg Oral Q6H  . pantoprazole  40 mg Oral Daily  . sodium chloride  3 mL Intravenous Q12H  . sodium chloride  3 mL Intravenous Q12H  .  vitamin C  500 mg Oral Daily  . vitamin E  1,000 Units Oral Daily  . warfarin  6 mg Oral ONCE-1800  . Warfarin - Pharmacist Dosing Inpatient   Does not apply q1800   Continuous Infusions: . heparin 1,600 Units/hr (09/11/12 0629)   PRN Meds:.sodium chloride, acetaminophen, acetaminophen, ALPRAZolam, HYDROcodone-acetaminophen, sodium chloride  Assessment/Plan: 77 year old presented with left lower extremity swelling, pain, decreased ambulation and cyanosis found to have extensively unprovoked appearing DVT.   1. Acute Extensive Left lower extremity DVT -Heparin gtt, Coumadin per pharmacy -Monitor INR goal 2-3. INR 1.77 -PT rec 24/7 supervision and at home services; pending OT input  -prn Norco or Tylenol for pain  -Neurovascular checks q6  -unclear etiology (no trauma, no immobility, no travel)  -will need outpatient w/u for malignancy i.e  repeat colonoscopy, upcoming mammogram. Consider hypercoagulable w/u in the future -Vasc. rec 20 degree elevation, Coumadin x 6 months at least, compression stockings 20-30 mmHg  2. Leukocytosis  -improving  Continue medications for chronic medical conditions.    Dispo: Disposition is deferred at this time, awaiting improvement of current medical problems.  Anticipated discharge in approximately 1 day(s).   The patient does have a current PCP Larina Bras, Harriett Sine, NP), therefore will be requiring OPC follow-up after discharge.   The patient does have transportation limitations that hinder transportation to clinic appointments.  .Services Needed at time of discharge: Y = Yes, Blank = No PT: Y  OT:   RN:   Equipment:   Other:     LOS: 3 days   [name] [phone] 09/11/2012, 10:12 AM

## 2012-09-11 NOTE — Progress Notes (Addendum)
ANTICOAGULATION CONSULT NOTE - Follow Up Consult  Pharmacy Consult for Coumadin and Heparin  Indication: DVT  Allergies  Allergen Reactions  . Niaspan (Niacin Er)     Severe flushing  . Nitroglycerin Other (See Comments)    Blood pressure drops down to low    Patient Measurements: Height: 5\' 5"  (165.1 cm) Weight: 231 lb 4.2 oz (104.9 kg) IBW/kg (Calculated) : 57 Heparin Dosing Weight: 81kg  Vital Signs: Temp: 98 F (36.7 C) (03/03 0503) Temp src: Oral (03/03 0503) BP: 122/58 mmHg (03/03 0935) Pulse Rate: 66 (03/03 0935)  Labs:  Recent Labs  09/09/12 0425 09/09/12 1126 09/10/12 0710 09/11/12 0507 09/11/12 0942  HGB 11.9*  --  11.8*  --  11.6*  HCT 36.1  --  35.1*  --  35.7*  PLT 227  --  258  --  263  LABPROT 13.7  --  16.9* 20.0*  --   INR 1.06  --  1.41 1.77*  --   HEPARINUNFRC  --  0.34 0.45 0.41  --     Estimated Creatinine Clearance: 74.2 ml/min (by C-G formula based on Cr of 0.8).  . heparin 1,600 Units/hr (09/11/12 1610)    Assessment: 76yof on Hep/Warf bridge D# 4/5 for acute DVT.  BL INR 1.01, h/h and plts wnl, no bleeding reported, Hep Wt 81kg, Warf pts: 4. Heparin level therapeutic at 0.41 on 1600 units/hr.  INR rising steadily, still slightly subtherapeutic.  Goal of Therapy:  INR 2-3 Monitor platelets by anticoagulation protocol: Yes Heparin level goal 0.3-0.7  Plan:  1. Coumadin 6mg  po x 1 today 2. Continue heparin 1600 units/hr.  Will need at least 1 more day of heparin bridge, also need therapeutic INR x 24 hrs before heparin can be d/c'd. 3. Daily INR and CBC.  Thank you,  Tad Moore, BCPS  Clinical Pharmacist Pager [phone]  09/11/2012 10:42 AM

## 2012-09-11 NOTE — Care Management Note (Addendum)
   CARE MANAGEMENT NOTE 09/11/2012  Patient:  Rachel Conner, Rachel Conner   Account Number:  [id number]  Date Initiated:  09/11/2012  Documentation initiated by:  Johny Shock  Subjective/Objective Assessment:   [place] [facility] needs, PT/OT/RN Janeice Robinson and SW.     Action/Plan:   Spoke with MD, no SW needs identified, spoke to MD who agreed just order everything?   Anticipated DC Date:  09/11/2012   Anticipated DC Plan:  HOME W HOME HEALTH SERVICES         [facility] Choice  HOME HEALTH   Choice offered to / List presented to:  C-1 Patient        HH arranged  HH-2 PT  HH-1 RN  HH-3 OT      [facility] agency  [facility]   Status of service:  Completed, signed off Medicare Important Message given?   (If response is "NO", the following Medicare IM given date fields will be blank) Date Medicare IM given:   Date Additional Medicare IM given:    Discharge Disposition:  HOME W HOME HEALTH SERVICES  Per UR Regulation:    If discussed at Long Length of Stay Meetings, dates discussed:    Comments:  09/11/2012 Met with pt and daughter re HH needs, patient wishes to use [facility], however this CM called [facility] and they are unable to provide HHPT or HHOT.  Pt will be setup with [facility] for HH needs. Spoke with MD re orders and she states that pt does not need SW. Notifed [facility].   Pt has DME.   Home health agency will see pt at home 1-2 days post d/c.  Johny Shock RN MPH Case Manager

## 2012-09-11 NOTE — Progress Notes (Signed)
Internal Medicine Teaching Service Attending Note Date: 09/11/2012  Patient name: Rachel Conner  Medical record number: [id number]  Date of birth: 1935/08/20   Ms Gaynor is a 77 year old caucasian lady with past medical history significant for hypothyroidism, HTN and hyperlipidemia who is being treated for Left leg DVT.   Filed Vitals:   09/11/12 1403  BP: 124/61  Pulse: 72  Temp: 98.8 F (37.1 C)  Resp: 20   General: Ly[place] in bed, obese, no distress.  Heart: RRR, S1S2 normal , no murmurs.  Lungs: Bilateral vesicular breath sounds.  Abdomen: benign.  Left leg: no remarkable swelling as compared to right. Firm to palpation. No redness, no tenderness.   Labs reviewed.  I have discussed this patient with Dr. Shirlee Latch and Dr. Everardo Beals. I have reviewed her previous notes and findings. I have gone through Dr. Alford Highland note from today and agree with the documentation. In essence:  - We continue her on heparin infusion and coumadin and target goal inr of 2-3. - Manage hypothyroidism on synthroid. - Check A1c for DM given hyperglycemia.     Aletta Edouard 09/11/2012, 4:39 PM.

## 2012-09-11 NOTE — Progress Notes (Signed)
Physical Therapy Treatment Patient Details Name: Rachel Conner MRN: [id number] DOB: July 01, 1936 Today's Date: 09/11/2012 Time: 3086-5784 PT Time Calculation (min): 48 min  PT Assessment / Plan / Recommendation Comments on Treatment Session  Patient tolerated increased ambulation distance today.   Still with significant pain left LE, but reports swelling better.  Encouraged to keep elevated on pillows.  Has walker at home, but belongs to spouse and he is using it due to gout flare.    Follow Up Recommendations  Home health PT;Supervision/Assistance - 24 hour           Equipment Recommendations  Rolling walker with 5" wheels       Frequency Min 4X/week   Plan Discharge plan remains appropriate    Precautions / Restrictions Precautions Precautions: Fall Restrictions Weight Bearing Restrictions: No   Pertinent Vitals/Pain 7/10 left LE    Mobility  Bed Mobility Bed Mobility: Supine to Sit Supine to Sit: 5: Supervision;HOB elevated Sit to Supine: 5: Supervision Transfers Sit to Stand: 4: Min guard;From bed;With upper extremity assist Stand to Sit: 4: Min guard;To bed Ambulation/Gait Ambulation/Gait Assistance: 4: Min guard Ambulation Distance (Feet): 120 Feet Assistive device: Rolling walker Gait Pattern: Step-to pattern;Antalgic      PT Goals Acute Rehab PT Goals Pt will go Supine/Side to Sit: Independently;with HOB 0 degrees PT Goal: Supine/Side to Sit - Progress: Progressing toward goal Pt will go Sit to Supine/Side: Independently;with HOB 0 degrees PT Goal: Sit to Supine/Side - Progress: Progressing toward goal Pt will go Sit to Stand: with supervision;with upper extremity assist PT Goal: Sit to Stand - Progress: Progressing toward goal Pt will Ambulate: 51 - 150 feet;with supervision;with rolling walker PT Goal: Ambulate - Progress: Progressing toward goal  Visit Information  Last PT Received On: 09/11/12    Subjective Data  Subjective: Been up to potty  chair with help   Cognition  Cognition Overall Cognitive Status: Appears within functional limits for tasks assessed/performed Arousal/Alertness: Awake/alert Orientation Level: Appears intact for tasks assessed Behavior During Session: [facility] for tasks performed    Balance  Static Standing Balance Static Standing - Balance Support: Bilateral upper extremity supported Static Standing - Level of Assistance: 5: Stand by assistance  End of Session PT - End of Session Equipment Utilized During Treatment: Gait belt Activity Tolerance: Patient limited by pain Patient left: in bed   GP     [facility] 09/11/2012, 3:20 PM [place], [place] [phone] 09/11/2012

## 2012-09-12 LAB — CBC
Platelets: 283 10*3/uL (ref 150–400)
RDW: 14.5 % (ref 11.5–15.5)
WBC: 7.1 10*3/uL (ref 4.0–10.5)

## 2012-09-12 LAB — PROTIME-INR: INR: 2.14 — ABNORMAL HIGH (ref 0.00–1.49)

## 2012-09-12 LAB — HEMOGLOBIN A1C: Hgb A1c MFr Bld: 6.4 % — ABNORMAL HIGH (ref <5.7)

## 2012-09-12 LAB — HEPARIN LEVEL (UNFRACTIONATED): Heparin Unfractionated: <0.1 IU/mL — ABNORMAL LOW (ref 0.30–0.70)

## 2012-09-12 MED ORDER — ENOXAPARIN SODIUM 150 MG/ML ~~LOC~~ SOLN: 150.0000 mg | Freq: Once | SUBCUTANEOUS | Status: DC

## 2012-09-12 MED ORDER — ENOXAPARIN SODIUM 150 MG/ML ~~LOC~~ SOLN
1.5000 mg/kg | Freq: Once | SUBCUTANEOUS | Status: DC
  Filled 2012-09-12: qty 2

## 2012-09-12 MED ORDER — ENOXAPARIN SODIUM 150 MG/ML ~~LOC~~ SOLN
150.0000 mg | Freq: Once | SUBCUTANEOUS | Status: AC
  Administered 2012-09-12: 150 mg via SUBCUTANEOUS
  Filled 2012-09-12: qty 1

## 2012-09-12 MED ORDER — HYDROCODONE-ACETAMINOPHEN 5-325 MG PO TABS: 1.0000 | ORAL_TABLET | ORAL | Status: DC | PRN

## 2012-09-12 MED ORDER — WARFARIN SODIUM 6 MG PO TABS
6.0000 mg | ORAL_TABLET | Freq: Once | ORAL | Status: AC
  Administered 2012-09-12: 6 mg via ORAL
  Filled 2012-09-12: qty 1

## 2012-09-12 MED ORDER — WARFARIN SODIUM 6 MG PO TABS: 6.0000 mg | ORAL_TABLET | Freq: Every day | ORAL | Status: DC

## 2012-09-12 NOTE — ED Provider Notes (Signed)
Medical screening examination/treatment/procedure(s) were conducted as a shared visit with non-physician practitioner(s) and myself.  I personally evaluated the patient during the encounter.  Please see my note from this date.  Olivia Mackie, MD 09/12/12 2049

## 2012-09-12 NOTE — Progress Notes (Signed)
Subjective: Patient c/o 9/10 left leg pain and swelling in knee.  Still no bowel movement.  She is tolerating oral.    Objective: Vital signs in last 24 hours: Filed Vitals:   09/11/12 1403 09/11/12 1646 09/11/12 2002 09/12/12 0609  BP: 124/61 138/44 126/64 141/54  Pulse: 72 75 79 64  Temp: 98.8 F (37.1 C) 98.9 F (37.2 C) 99.5 F (37.5 C) 98.7 F (37.1 C)  TempSrc: Oral  Oral Oral  Resp: 20 20 20 20   Height:   5\' 5"  (1.651 m)   Weight:   231 lb 4.2 oz (104.9 kg)   SpO2: 98% 99% 96% 96%   Weight change: 0 lb (0 kg)  Intake/Output Summary (Last 24 hours) at 09/12/12 0921 Last data filed at 09/12/12 4098  Gross per 24 hour  Intake   1000 ml  Output    850 ml  Net    150 ml    Physical Exam:  General: lying in bed, nad, alert and oriented x 3 HEENT: [place]/at CV: RRR no murmurs, multiple scars to chest  Lungs: ctab Abdomen: obese, soft, ntnd, nl bs  Extremities: LLE edematous, warm, no cyanosis, 1+ DP and PT pulses LLE; RLE-normal, warm, no cyanosis or edema Neuro: moving all 4 extremities, alert and oriented x 4   Lab Results: Basic Metabolic Panel:  Recent Labs Lab 09/08/12 0706  NA 135  K 4.0  CL 101  CO2 23  GLUCOSE 188*  BUN 19  CREATININE 0.80  CALCIUM 10.1   Liver Function Tests:  Recent Labs Lab 09/06/12 1139  AST 24  ALT 23  ALKPHOS 28*  BILITOT 0.6  PROT 6.8  ALBUMIN 3.6   CBC:  Recent Labs Lab 09/11/12 0942 09/12/12 0641  WBC 8.7 7.1  HGB 11.6* 11.1*  HCT 35.7* 33.8*  MCV 89.7 89.2  PLT 263 283   CBG:  Recent Labs Lab 09/10/12 2105 09/11/12 0725 09/11/12 1140 09/11/12 1648 09/11/12 2005 09/12/12 0741  GLUCAP 109* 97 130* 93 111* 101*   Fasting Lipid Panel:  Recent Labs Lab 09/06/12 1139  CHOL 127  HDL 37.30*  LDLCALC 67  TRIG 113.0  CHOLHDL 3   Coagulation:  Recent Labs Lab 09/09/12 0425 09/10/12 0710 09/11/12 0507 09/12/12 0641  LABPROT 13.7 16.9* 20.0* 23.0*  INR 1.06 1.41 1.77* 2.14*    Urinalysis:  Recent Labs Lab 09/08/12 1709  COLORURINE AMBER*  LABSPEC 1.023  PHURINE 5.0  GLUCOSEU NEGATIVE  HGBUR NEGATIVE  BILIRUBINUR NEGATIVE  KETONESUR NEGATIVE  PROTEINUR NEGATIVE  UROBILINOGEN 0.2  NITRITE NEGATIVE  LEUKOCYTESUR MODERATE*   Misc. Labs: None   Studies/Results: Left lower extremity duplex 09/08/12:  Left: DVT noted from the CFV through the PTV and Peroneal v including the gastrocnemius veins. Superficial thrombosis of the GSV from the SFJ through the mid thigh. 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 Right CFV patent. Right CFA waveform triphasic.     Medications: Scheduled Meds: . aspirin  325 mg Oral QHS  . atorvastatin  40 mg Oral q1800  . fenofibrate  160 mg Oral Daily  . FLUoxetine  20 mg Oral Daily  . levothyroxine  100 mcg Oral QAC breakfast  . losartan  50 mg Oral Daily  . metoprolol  50 mg Oral BID  . omega-3 acid ethyl esters  1 g Oral Daily  . oxybutynin  5 mg Oral Q6H  . pantoprazole  40 mg Oral Daily  . sodium chloride  3 mL Intravenous Q12H  .  sodium chloride  3 mL Intravenous Q12H  . vitamin C  500 mg Oral Daily  . vitamin E  1,000 Units Oral Daily  . Warfarin - Pharmacist Dosing Inpatient   Does not apply q1800   Continuous Infusions: . heparin 1,600 Units/hr (09/12/12 0512)   PRN Meds:.sodium chloride, acetaminophen, acetaminophen, ALPRAZolam, HYDROcodone-acetaminophen, sodium chloride  Assessment/Plan: 77 year old presented with left lower extremity swelling, pain, decreased ambulation and cyanosis found to have extensively unprovoked appearing DVT.   1. Acute Extensive Left lower extremity DVT -Heparin gtt, Coumadin per pharmacy (will do 6 mg qd at discharge) -Monitor INR goal 2-3. INR 2.14 -will turn Heparin off for 1 hour then give Lovenox 150 mg today and then 24 hours later.   -f/u with [facility] 09/13/12 11 AM for INR check -f/u with Vasc. Surgery Dr. Hart [place]  [phone] -d/c with rolling walker, PT, OT, HH services.   -prn Norco or Tylenol for pain  -unclear etiology (no trauma, no immobility, no travel)  -will need outpatient w/u for malignancy i.e repeat colonoscopy, upcoming mammogram. Consider hypercoagulable w/u in the future -Vasc. rec 20 degree elevation, Coumadin x 6 months at least, compression stockings 20-30 mmHg  2. Leukocytosis, resolved  3. Hyperglycemia -pending HA1C  4. Continue medications for chronic medical conditions.    Dispo:  Anticipated discharge possibly today.   The patient does have a current PCP Larina Bras, Harriett Sine, NP), therefore will be requiring OPC follow-up after discharge.   The patient does have transportation limitations that hinder transportation to clinic appointments.  .Services Needed at time of discharge: Y = Yes, Blank = No PT: Y  OT: Y  RN:   Equipment: Rolling walker   Other:     LOS: 4 days   [name] [phone] 09/12/2012, 9:21 AM

## 2012-09-12 NOTE — Progress Notes (Signed)
ANTICOAGULATION CONSULT NOTE - Follow Up Consult  Pharmacy Consult for Coumadin and Heparin  Indication: DVT  Allergies  Allergen Reactions  . Niaspan (Niacin Er)     Severe flushing  . Nitroglycerin Other (See Comments)    Blood pressure drops down to low    Patient Measurements: Height: 5\' 5"  (165.1 cm) Weight: 231 lb 4.2 oz (104.9 kg) IBW/kg (Calculated) : 57 Heparin Dosing Weight: 81kg  Vital Signs: Temp: 98.7 F (37.1 C) (03/04 0609) Temp src: Oral (03/04 0609) BP: 141/54 mmHg (03/04 0609) Pulse Rate: 64 (03/04 0609)  Labs:  Recent Labs  09/10/12 0710 09/11/12 0507 09/11/12 0942 09/12/12 0641  HGB 11.8*  --  11.6* 11.1*  HCT 35.1*  --  35.7* 33.8*  PLT 258  --  263 283  LABPROT 16.9* 20.0*  --  23.0*  INR 1.41 1.77*  --  2.14*  HEPARINUNFRC 0.45 0.41  --  <0.10*    Estimated Creatinine Clearance: 74.2 ml/min (by C-G formula based on Cr of 0.8).  . heparin 1,600 Units/hr (09/12/12 0981)    Assessment: 76yof on Hep/Warf bridge D# 5/5 for acute DVT.  BL INR 1.01, h/h and plts wnl, no bleeding reported, Hep Wt 81kg, Warf pts: 4.   Heparin level undetectable this AM.  Spoke with patient, she reports that heparin infusion was off x several hours last night until IV was restarted in opposite arm this AM.  INR rising steadily, now therapeutic.  Needs another 24 hrs of parenteral anticoagulant until INR can be confirmed as > 2 x 24 hrs.   Goal of Therapy:  INR 2-3 Monitor platelets by anticoagulation protocol: Yes Heparin level goal 0.3-0.7  Plan:  1.  Repeat Coumadin 6 mg po x 1 before discharge today. 2.  Recommend Coumadin 6 mg daily until INR recheck (?could this be tomorrow) 3. Spoke to MD, planning to change to 1.5 mg/kg Lovenox once daily just before discharge.  Continue IV heparin until 1 hr before Lovenox dose. 4. Recommend continuing Lovenox 1.5 mg/kg (150mg ) until INR confirmed as > 2 tomorrow.  Tad Moore, BCPS  Clinical  Pharmacist Pager [phone]  09/12/2012 8:55 AM

## 2012-09-12 NOTE — Progress Notes (Signed)
Order received, chart reviewed, noted pt to be D/C'd today. Per chart review pt has 24 A/S and all DME needed, do not feel she needs OT eval, will sign off.  Rachel Conner, [place] [phone] 09/12/2012

## 2012-09-12 NOTE — Discharge Summary (Signed)
Internal Medicine Teaching Service Attending Note Date: 09/12/2012  Patient name: Rachel Conner  Medical record number: [id number]  Date of birth: 1936/01/21    I evaluated the patient on the day of discharge and discussed the discharge plan with my resident team. The patient appears to be doing well and clinically improving. She says she is ready to go home. She does not have any intolerable pain in the left leg and is working with PT. Labs and vitals reviewed.   Please follow the detailed management plan in the resident discharge note from today. I have met with my team and discussed the care of the patient with them in detail.  I agree with the discharge documentation and disposition.    Thanks Aletta Edouard 09/12/2012, 2:19 PM

## 2012-09-12 NOTE — Progress Notes (Signed)
All d/c instructions explained via teach back method.  Patient and husband stated understanding.  Patient stated that one of her friends (who is an rn) will give her lovenox shot tomorrow.

## 2012-09-12 NOTE — Discharge Summary (Signed)
Internal Medicine Teaching [facility] Discharge Note  Name: Rachel Conner MRN: [id number] DOB: Jul 04, 1936 77 y.o.  Date of Admission: 09/08/2012  5:47 AM Date of Discharge: 09/12/2012 Attending Physician: Aletta Edouard, MD  Discharge Diagnosis: 1. **  Leg DVT (deep venous thromboembolism), acute 2. Hyperglycemia 3. Resolved Fever 4. Resolved leukocytosis   Discharge Medications:   Medication List    TAKE these medications       ALPRAZolam 0.5 MG tablet  Commonly known as:  XANAX  Take 0.5 mg by mouth at bedtime as needed for anxiety.     aspirin 325 MG tablet  Take 325 mg by mouth daily.     enoxaparin 150 MG/ML injection  Commonly known as:  LOVENOX  Inject 1 mL (150 mg total) into the skin once.  Start taking on:  09/13/2012     fenofibrate 145 MG tablet  Commonly known as:  TRICOR  Take 145 mg by mouth daily.     fish oil-omega-3 fatty acids 1000 MG capsule  Take 1 g by mouth daily.     FLUoxetine 20 MG capsule  Commonly known as:  PROZAC  Take 20 mg by mouth daily.     HYDROcodone-acetaminophen 5-325 MG per tablet  Commonly known as:  NORCO/VICODIN  Take 1-2 tablets by mouth every 4 (four) hours as needed.     levothyroxine 100 MCG tablet  Commonly known as:  SYNTHROID, LEVOTHROID  Take 100 mcg by mouth daily.     losartan 50 MG tablet  Commonly known as:  COZAAR  TAKE 1 TABLET BY MOUTH DAILY     metoprolol 50 MG tablet  Commonly known as:  LOPRESSOR  Take 50 mg by mouth 2 (two) times daily.     oxybutynin 5 MG tablet  Commonly known as:  DITROPAN  Take 5 mg by mouth 4 (four) times daily.     pantoprazole 40 MG tablet  Commonly known as:  PROTONIX  Take 40 mg by mouth daily.     rosuvastatin 20 MG tablet  Commonly known as:  CRESTOR  Take 20 mg by mouth daily.     vitamin C 500 MG tablet  Commonly known as:  ASCORBIC ACID  Take 500 mg by mouth daily.     vitamin E 400 UNIT capsule  Take 1,000 Units by mouth daily.     warfarin 6  MG tablet  Commonly known as:  COUMADIN  Take 1 tablet (6 mg total) by mouth daily.        Disposition and follow-up:   [name] was discharged from [facility] in good condition.  At the hospital follow up visit please address  1) Check INR 2) Follow up Hemoglobin A1C 3) Dose Coumadin according to INR 4) Make sure the patient follows up for mammogram and repeat colonoscopy.   Follow-up Appointments:  Discharge Orders   Future Orders Complete By Expires     Diet - low sodium heart healthy  As directed     Discharge instructions  As directed     Comments:      We enjoyed participating in your care.   1. elevate leg 20 degrees, Take Coumadin x 6 months at least (PCP to reevaluate), and wear compression stockings 20-30 mmHg daily thigh high to the left lower extremity.    2. Take Lovenox once 3/5  3. Take Coumadin 6 mg daily   4. Follow up with [facility] [phone] 09/13/12 at 11 am  5. Follow up with Vascular surgery Dr. Hart Rochester.  The office should call with an appointment. If not call them [phone] in 1-2 weeks   6. Follow up with getting your mammogram and colonoscopy repeat    For home use only DME Walker rolling  As directed     Comments:      5" rolling walker    Increase activity slowly  As directed     Scheduling Instructions:      Use a walker at home       Consultations:  Dr. Sena Slate surgery   Procedures Performed:  Dg Chest Port 1 View  09/08/2012  *RADIOLOGY REPORT*  Clinical Data: Preoperative respiratory exam.  Deep venous thrombosis of the left leg.  PORTABLE CHEST - 1 VIEW  Comparison: 10/20/2009  Findings: The heart size and pulmonary vascularity are normal and the lungs are clear.  CABG.  No acute osseous abnormality.  IMPRESSION: No acute disease.   Original Report Authenticated By: Francene Boyers, M.D.    09/08/12 lower extremity venous doppler Summary: - Findings consistent with deep vein thrombosis  involving the left common femoral vein, left profunda femoris vein, left femoral vein, left popliteal vein, left posterial tibial vein, left peroneal vein, left greater saphenous vein, and left gastrocnemius vein. 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    Admission HPI:  Patient is a pleasant 36 year woman with past history of cardiovascular disease including CAD status post CABG and stenting, hyperlipidemia, hypothyroidism and other medical problems as per problem list who comes to the ED with chief complaint of left leg swelling and pain for about 1-2 weeks.  Leg pain and swelling was worsening x 1 week.  She also has associated pain which got worse with ambulation. She had decreased ambulation due to pain and swelling.  She tried Aspirin at home for pain with minimal relief and then after tried Jule Ser though she thought she was allergic to [facility] because her leg swelling became worse.  On admission she reported decreased energy x 2 weeks  She denies any recent long car or air travel, any recent surgery or immobilization, smoking, prior DVT, or trauma.  She denies any chest pain, short of breath, palpitations, increased work of breathing, headache, nausea, vomiting, abdominal pain, diarrhea.  She does not smoke or drink alcohol.    Hospital Course by problem list: 1. 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  She was started on a Heparin drip and Coumadin per pharmacy.  By day 4 of length of stay her INR was 2.14.  We gave her a dose of Lovenox 1.5 mg/kg prior to discharge an hour after the Heparin drip was turned off.  We will discharge her on 6 mg daily Coumadin to follow up with her primary care provider at [facility] 09/13/12  at 11 am for a INR check.  Dr. Candie Chroman vascular surgery office will call her with an appointment to follow up office phone [phone].  We will discharge her with pain control a rolling walker, PT, OT, HH services.  There is unclear etiology of her DVT (no trauma, no immobility, no travel).  She will need outpatient work up for malignancy i.e repeat colonoscopy, upcoming mammogram. Consider hypercoagulable work up in the future with PCP or hematology.  She is to  elevate her leg 20 degrees, be on Coumadin x 6 months at least (PCP to reevaluate), and wear compression stockings 20-30 mmHg daily thigh high to the left lower extremity.     2. Hyperglycemia We have a pending hemoglobin A1 C which her primary doctor will need to follow.    3. Resolved Fever She has been afebrile x 3 days.  Fever origin may have been acute due to acute DVT.  Urine culture did not show significant growth.    4. Resolved leukocytosis   Discharge Vitals:  BP 129/74  Pulse 65  Temp(Src) 98.5 F (36.9 C) (Oral)  Resp 18  Ht 5\' 5"  (1.651 m)  Wt 231 lb 4.2 oz (104.9 kg)  BMI 38.48 kg/m2  SpO2 96%  Discharge physical exam: Physical Exam:  General: lying in bed, nad, alert and oriented x 3  HEENT: [place]/at  CV: RRR no murmurs, multiple scars to chest  Lungs: ctab  Abdomen: obese, soft, ntnd, nl bs  Extremities: LLE edematous, warm, no cyanosis, 1+ DP and PT pulses LLE; RLE-normal, warm, no cyanosis or edema  Neuro: moving all 4 extremities, alert and oriented x 4   Discharge Labs:   Results for KORRA, CHRISTINE (MRN [id number]) as of 09/12/2012 10:33  Ref. Range 09/06/2012 11:39 09/08/2012 07:06  Sodium Latest Range: 135-145 mEq/L  135  Potassium Latest Range: 3.5-5.1 mEq/L  4.0  Chloride Latest Range: 96-112 mEq/L  101  CO2 Latest Range: 19-32 mEq/L  23  BUN Latest Range: 6-23 mg/dL  19  Creatinine Latest Range: 0.50-1.10 mg/dL  4.09  Calcium Latest Range: 8.4-10.5 mg/dL  81.1  GFR calc non Af Amer Latest Range:  >90 mL/min  71 (L)  GFR calc Af Amer Latest Range: >90 mL/min  82 (L)  Glucose Latest Range: 70-99 mg/dL  914 (H)  Alkaline Phosphatase Latest Range: 39-117 U/L 28 (L)   Albumin Latest Range: 3.5-5.2 g/dL 3.6   AST Latest Range: 0-37 U/L 24   ALT Latest Range: 0-35 U/L 23   Total Protein Latest Range: 6.0-8.3 g/dL 6.8   Bilirubin, Direct Latest Range: 0.0-0.3 mg/dL 0.1   Total Bilirubin Latest Range: 0.3-1.2 mg/dL 0.6    Results for KHARI, LETT (MRN [id number]) as of 09/12/2012 10:33  Ref. Range 09/06/2012 11:39  Cholesterol Latest Range: 0-200 mg/dL 086  Triglycerides Latest Range: 0.0-149.0 mg/dL 578.4  HDL Latest Range: >39.00 mg/dL 69.62 (L)  LDL (calc) Latest Range: 0-99 mg/dL 67  VLDL Latest Range: 0.0-40.0 mg/dL 95.2  Total CHOL/HDL Ratio No range found 3   Results for JASIE, MELESKI (MRN [id number]) as of 09/12/2012 10:33  Ref. Range 09/08/2012 07:06 09/09/2012 04:25 09/10/2012 07:10 09/11/2012 09:42 09/12/2012 06:41  WBC Latest Range: 4.0-10.5 K/uL 13.6 (H) 12.1 (H) 12.1 (H) 8.7 7.1  RBC Latest Range: 3.87-5.11 MIL/uL 4.06 4.01 3.94 3.98 3.79 (L)  Hemoglobin Latest Range: 12.0-15.0 g/dL 02.7 25.3 (L) 66.4 (L) 11.6 (L) 11.1 (L)  HCT Latest Range: 36.0-46.0 % 36.0 36.1 35.1 (L) 35.7 (L) 33.8 (L)  MCV Latest Range: 78.0-100.0 fL 88.7 90.0 89.1 89.7 89.2  MCH Latest Range: 26.0-34.0 pg 30.3 29.7 29.9 29.1 29.3  MCHC Latest Range: 30.0-36.0 g/dL 40.3 47.4 25.9 56.3 87.5  RDW Latest Range: 11.5-15.5 % 14.1 14.3 14.1 14.3 14.5  Platelets Latest Range: 150-400 K/uL 246 227 258 263 283   Results for NYELLIE, YETTER (MRN [id number]) as of 09/12/2012 10:33  Ref. Range 09/08/2012 07:06 09/08/2012 15:44 09/09/2012 00:58 09/09/2012 04:25 09/09/2012 11:26  09/10/2012 07:10 09/11/2012 05:07 09/12/2012 06:41  Heparin Unfractionated Latest Range: 0.30-0.70 IU/mL  0.22 (L) 0.20 (L)  0.34 0.45 0.41 <0.10 (L)  Prothrombin Time Latest Range: 11.6-15.2 seconds 13.2   13.7  16.9 (H) 20.0 (H) 23.0 (H)  INR Latest  Range: 0.00-1.49  1.01   1.06  1.41 1.77 (H) 2.14 (H)   Results for DIAVION, LABRADOR (MRN [id number]) as of 09/12/2012 10:33  Ref. Range 09/08/2012 17:09  Color, Urine Latest Range: YELLOW  AMBER (A)  APPearance Latest Range: CLEAR  HAZY (A)  Specific Gravity, Urine Latest Range: 1.005-1.030  1.023  pH Latest Range: 5.0-8.0  5.0  Glucose Latest Range: NEGATIVE mg/dL NEGATIVE  Bilirubin Urine Latest Range: NEGATIVE  NEGATIVE  Ketones, ur Latest Range: NEGATIVE mg/dL NEGATIVE  Protein Latest Range: NEGATIVE mg/dL NEGATIVE  Urobilinogen, UA Latest Range: 0.0-1.0 mg/dL 0.2  Nitrite Latest Range: NEGATIVE  NEGATIVE  Leukocytes, UA Latest Range: NEGATIVE  MODERATE (A)  Hgb urine dipstick Latest Range: NEGATIVE  NEGATIVE  Urine-Other No range found MUCOUS PRESENT  WBC, UA Latest Range: <3 WBC/hpf 21-50  Squamous Epithelial / LPF Latest Range: RARE  FEW (A)  Bacteria, UA Latest Range: RARE  FEW (A)  Casts Latest Range: NEGATIVE  HYALINE CASTS (A)   09/08/12 urine culture:  35,000 COLONIES/ML    Multiple bacterial      Results for orders placed during the hospital encounter of 09/08/12 (from the past 24 hour(s))  GLUCOSE, CAPILLARY     Status: Abnormal   Collection Time    09/11/12 11:40 AM      Result Value Range   Glucose-Capillary 130 (*) 70 - 99 mg/dL  GLUCOSE, CAPILLARY     Status: None   Collection Time    09/11/12  4:48 PM      Result Value Range   Glucose-Capillary 93  70 - 99 mg/dL  GLUCOSE, CAPILLARY     Status: Abnormal   Collection Time    09/11/12  8:05 PM      Result Value Range   Glucose-Capillary 111 (*) 70 - 99 mg/dL  HEPARIN LEVEL (UNFRACTIONATED)     Status: Abnormal   Collection Time    09/12/12  6:41 AM      Result Value Range   Heparin Unfractionated <0.10 (*) 0.30 - 0.70 IU/mL  PROTIME-INR     Status: Abnormal   Collection Time    09/12/12  6:41 AM      Result Value Range   Prothrombin Time 23.0 (*) 11.6 - 15.2 seconds   INR 2.14 (*) 0.00 - 1.49  CBC      Status: Abnormal   Collection Time    09/12/12  6:41 AM      Result Value Range   WBC 7.1  4.0 - 10.5 K/uL   RBC 3.79 (*) 3.87 - 5.11 MIL/uL   Hemoglobin 11.1 (*) 12.0 - 15.0 g/dL   HCT 30.8 (*) 65.7 - 84.6 %   MCV 89.2  78.0 - 100.0 fL   MCH 29.3  26.0 - 34.0 pg   MCHC 32.8  30.0 - 36.0 g/dL   RDW 96.2  95.2 - 84.1 %   Platelets 283  150 - 400 K/uL  GLUCOSE, CAPILLARY     Status: Abnormal   Collection Time    09/12/12  7:41 AM      Result Value Range   Glucose-Capillary 101 (*) 70 - 99 mg/dL   Comment 1 Documented in Chart  Comment 2 Notify RN      Signed: [name] 09/12/2012, 11:07 AM   Time Spent on Discharge: >31 minutes  Services Ordered on Discharge: home health, PT/OT Equipment Ordered on Discharge: rolling walker

## 2012-09-13 ENCOUNTER — Telehealth: Payer: Self-pay | Admitting: Vascular Surgery

## 2012-09-13 NOTE — Telephone Encounter (Addendum)
Message copied by Shari Prows on Wed Sep 13, 2012  3:45 PM ------      Message from: Melene Plan      Created: Wed Sep 13, 2012  3:33 PM      Regarding: FW: follow up appts       FYI      ----- Message -----         From: Marlowe Shores, PA-C         Sent: 09/13/2012   3:07 PM           To: Melene Plan, RN      Subject: RE: follow up appts                                      She was a pt seen by Dr. Hart Rochester in consult for DVT and then admitted to med sx. We did not follow so  she follows up with PCP      ----- Message -----         From: Melene Plan, RN         Sent: 09/13/2012  11:28 AM           To: Kathi Der, PA-C, #      Subject: FW: follow up appts                                      Does anyone know if we need to follow up?      ----- Message -----         From: Shari Prows         Sent: 09/13/2012  10:36 AM           To: Melene Plan, RN      Subject: follow up appts                                          Pt called yesterday to inquire when her follow up appt would be. She was dc yesterday from [facility]. Thanks, Drinda Butts                   ------I called patient to let her know that we do not need to see her here. She voiced understanding and stated she saw her PCP today. awt

## 2012-09-25 ENCOUNTER — Encounter: Payer: Self-pay | Admitting: *Deleted

## 2012-09-26 ENCOUNTER — Other Ambulatory Visit: Payer: Self-pay | Admitting: *Deleted

## 2012-09-26 ENCOUNTER — Ambulatory Visit (INDEPENDENT_AMBULATORY_CARE_PROVIDER_SITE_OTHER): Payer: Medicare Other | Admitting: *Deleted

## 2012-09-26 DIAGNOSIS — I82402 Acute embolism and thrombosis of unspecified deep veins of left lower extremity: Secondary | ICD-10-CM

## 2012-09-26 NOTE — Progress Notes (Signed)
Fitted for thigh high grad compression stocking 20-30 mm HG. Appt made for fu reflux study and JDL visit in six mo.

## 2012-09-29 ENCOUNTER — Telehealth: Payer: Self-pay | Admitting: Cardiovascular Disease

## 2012-09-29 ENCOUNTER — Other Ambulatory Visit: Payer: Self-pay

## 2012-09-29 DIAGNOSIS — I82402 Acute embolism and thrombosis of unspecified deep veins of left lower extremity: Secondary | ICD-10-CM

## 2012-09-29 MED ORDER — ASPIRIN 81 MG PO TABS: 81.0000 mg | ORAL_TABLET | Freq: Every day | ORAL | Status: DC

## 2012-09-29 NOTE — Telephone Encounter (Addendum)
Pt wants Dr Eden Emms to know that she was in hosp recently for a deep venous thromboembolism in left leg behind the knee and that she was placed on Coumadin and Asa was decreased to 81 mg daily. Will forward note to Dr. Eden Emms.

## 2012-09-29 NOTE — Telephone Encounter (Signed)
plz return call to patient regarding DVT diagnosis, she can be reached at hm#

## 2012-09-29 NOTE — Telephone Encounter (Signed)
Hm# [phone] for return call

## 2012-10-17 ENCOUNTER — Other Ambulatory Visit: Payer: Self-pay | Admitting: Cardiology

## 2012-10-17 MED ORDER — LOSARTAN POTASSIUM 50 MG PO TABS: 50.0000 mg | ORAL_TABLET | Freq: Every day | ORAL | Status: DC

## 2013-02-14 ENCOUNTER — Other Ambulatory Visit: Payer: Self-pay

## 2013-03-05 ENCOUNTER — Encounter: Payer: Self-pay | Admitting: Vascular Surgery

## 2013-03-06 ENCOUNTER — Encounter: Payer: Self-pay | Admitting: Vascular Surgery

## 2013-03-06 ENCOUNTER — Ambulatory Visit (INDEPENDENT_AMBULATORY_CARE_PROVIDER_SITE_OTHER): Payer: Medicare Other | Admitting: Vascular Surgery

## 2013-03-06 ENCOUNTER — Encounter (INDEPENDENT_AMBULATORY_CARE_PROVIDER_SITE_OTHER): Payer: Medicare Other | Admitting: *Deleted

## 2013-03-06 VITALS — BP 161/78 | HR 67 | Resp 18 | Ht 65.0 in | Wt 228.0 lb

## 2013-03-06 DIAGNOSIS — I82402 Acute embolism and thrombosis of unspecified deep veins of left lower extremity: Secondary | ICD-10-CM

## 2013-03-06 DIAGNOSIS — I82409 Acute embolism and thrombosis of unspecified deep veins of unspecified lower extremity: Secondary | ICD-10-CM | POA: Insufficient documentation

## 2013-03-06 DIAGNOSIS — I803 Phlebitis and thrombophlebitis of lower extremities, unspecified: Secondary | ICD-10-CM

## 2013-03-06 DIAGNOSIS — I8010 Phlebitis and thrombophlebitis of unspecified femoral vein: Secondary | ICD-10-CM

## 2013-03-06 NOTE — Progress Notes (Signed)
Subjective:     Patient ID: Rachel Conner, female   DOB: September 17, 1935, 77 y.o.   MRN: [id number]  HPI this 77 year old female is seen in followup regarding a DVT of the left leg which occurred in February 2014. She had extensive DVT which involves the left external iliac common femoral superficial and profunda femoris popliteal and tibial veins as well as the great saphenous vein. At that time her swelling has become dramatically better. She does continue to have some mild swelling. She does elevate her legs sometimes and wears elastic compression stockings. She has no history of pulmonary embolus. She continues to take Coumadin on a daily basis. He also takes aspirin.  Past Medical History  Diagnosis Date  . Arthritis   . CAD (coronary artery disease) 1999    CABG  . Diabetes mellitus   . HLD (hyperlipidemia)   . HTN (hypertension)   . Hypothyroidism   . Colon polyps   . Hiatal hernia   . Peripheral neuropathy   . Anxiety   . Hemorrhoids     History  Substance Use Topics  . Smoking status: Former Games developer  . Smokeless tobacco: Never Used  . Alcohol Use: No    Family History  Problem Relation Age of Onset  . Heart disease Father   . Heart disease Mother   . Heart disease Brother   . Heart disease Other     grandfather (side unknown)  . Colon cancer Neg Hx     Allergies  Allergen Reactions  . Niaspan [Niacin Er]     Severe flushing  . Nitroglycerin Other (See Comments)    Blood pressure drops down to low    Current outpatient prescriptions:ALPRAZolam (XANAX) 0.5 MG tablet, Take 0.5 mg by mouth at bedtime as needed for anxiety. , Disp: , Rfl: ;  aspirin 81 MG tablet, Take 1 tablet (81 mg total) by mouth daily., Disp: 30 tablet, Rfl: 0;  fenofibrate (TRICOR) 145 MG tablet, Take 145 mg by mouth daily.  , Disp: , Rfl: ;  fish oil-omega-3 fatty acids 1000 MG capsule, Take 1 g by mouth daily.  , Disp: , Rfl:  FLUoxetine (PROZAC) 20 MG capsule, Take 20 mg by mouth daily.  , Disp:  , Rfl: ;  HYDROcodone-acetaminophen (NORCO/VICODIN) 5-325 MG per tablet, Take 1-2 tablets by mouth every 4 (four) hours as needed., Disp: 30 tablet, Rfl: 0;  levothyroxine (SYNTHROID, LEVOTHROID) 100 MCG tablet, Take 100 mcg by mouth daily.  , Disp: , Rfl: ;  losartan (COZAAR) 50 MG tablet, Take 1 tablet (50 mg total) by mouth daily., Disp: 90 tablet, Rfl: 2 metoprolol (LOPRESSOR) 50 MG tablet, Take 50 mg by mouth 2 (two) times daily.  , Disp: , Rfl: ;  nitrofurantoin (MACRODANTIN) 50 MG capsule, Take 50 mg by mouth 4 (four) times daily., Disp: , Rfl: ;  oxybutynin (DITROPAN) 5 MG tablet, Take 5 mg by mouth 4 (four) times daily. , Disp: , Rfl: ;  pantoprazole (PROTONIX) 40 MG tablet, Take 40 mg by mouth daily.  , Disp: , Rfl:  rosuvastatin (CRESTOR) 20 MG tablet, Take 20 mg by mouth daily.  , Disp: , Rfl: ;  vitamin C (ASCORBIC ACID) 500 MG tablet, Take 500 mg by mouth daily.  , Disp: , Rfl: ;  vitamin E 400 UNIT capsule, Take 1,000 Units by mouth daily. , Disp: , Rfl: ;  warfarin (COUMADIN) 6 MG tablet, Take 1 tablet (6 mg total) by mouth daily., Disp: 7 tablet, Rfl:  0 enoxaparin (LOVENOX) 150 MG/ML injection, Inject 1 mL (150 mg total) into the skin once., Disp: 1 mL, Rfl: 0  BP 161/78  Pulse 67  Resp 18  Ht 5\' 5"  (1.651 m)  Wt 228 lb (103.42 kg)  BMI 37.94 kg/m2  Body mass index is 37.94 kg/(m^2).          Review of Systems denies chest pain, dyspnea on exertion, PND, orthopnea, hemoptysis.    Objective:   Physical Exam BP 161/78  Pulse 67  Resp 18  Ht 5\' 5"  (1.651 m)  Wt 228 lb (103.42 kg)  BMI 37.94 kg/m2  GEN alert and oriented x3 in no apparent stress Lungs no rhonchi or wheezing Left leg with 3+ femoral and 2-3+ dorsalis pedis pulse palpable. Left calf about 1 cm larger in circumference than right. No bulging varicosities or ulceration noted.  Today I ordered a venous duplex exam which I reviewed and interpreted. There is no DVT distal to the common femoral vein all of  which has resolved. There is some old residual thrombus in the left common femoral vein and left external iliac vein but there is recannulization with flow present     Assessment:     History of extensive DVT 6 months ago left leg mostly resolved but some old residual thrombus in left external iliac and common femoral veins    Plan:     Would recommend 6 more months of Coumadin therapy and would then discontinue Coumadin and treat with aspirin only. If patient develops recurrent DVT she will need to be on lifelong anticoagulations would give her a trial without that after 6 more months  no need to return to our office unless she has recurrent severe edema or other problems develop

## 2013-04-03 ENCOUNTER — Other Ambulatory Visit: Payer: Self-pay | Admitting: *Deleted

## 2013-04-03 ENCOUNTER — Ambulatory Visit (INDEPENDENT_AMBULATORY_CARE_PROVIDER_SITE_OTHER): Payer: Medicare Other | Admitting: Cardiovascular Disease

## 2013-04-03 ENCOUNTER — Ambulatory Visit (INDEPENDENT_AMBULATORY_CARE_PROVIDER_SITE_OTHER): Payer: Medicare Other | Admitting: *Deleted

## 2013-04-03 VITALS — BP 138/62 | HR 72 | Ht 65.0 in | Wt 219.1 lb

## 2013-04-03 DIAGNOSIS — I2510 Atherosclerotic heart disease of native coronary artery without angina pectoris: Secondary | ICD-10-CM

## 2013-04-03 DIAGNOSIS — I119 Hypertensive heart disease without heart failure: Secondary | ICD-10-CM

## 2013-04-03 DIAGNOSIS — I82409 Acute embolism and thrombosis of unspecified deep veins of unspecified lower extremity: Secondary | ICD-10-CM

## 2013-04-03 DIAGNOSIS — E7800 Pure hypercholesterolemia, unspecified: Secondary | ICD-10-CM

## 2013-04-03 DIAGNOSIS — Z7901 Long term (current) use of anticoagulants: Secondary | ICD-10-CM

## 2013-04-03 DIAGNOSIS — I82402 Acute embolism and thrombosis of unspecified deep veins of left lower extremity: Secondary | ICD-10-CM

## 2013-04-03 MED ORDER — WARFARIN SODIUM 6 MG PO TABS: 6.0000 mg | ORAL_TABLET | Freq: Every day | ORAL | Status: DC

## 2013-04-03 MED ORDER — WARFARIN SODIUM 6 MG PO TABS: ORAL_TABLET | ORAL | Status: DC

## 2013-04-03 NOTE — Assessment & Plan Note (Signed)
F/U in our coumadin clinic Check INR today  Needs to take everyday.  F/U duplex in December Pain and swelling much better

## 2013-04-03 NOTE — Assessment & Plan Note (Signed)
Well controlled.  Continue current medications and low sodium Dash type diet.    

## 2013-04-03 NOTE — Patient Instructions (Addendum)

## 2013-04-03 NOTE — Progress Notes (Signed)
Patient ID: Rachel Conner, female   DOB: Sep 03, 1935, 77 y.o.   MRN: [id number] Imberly is seen today in followup for history of coronary artery disease and bypass surgery. I believe her bypass was in 99. He subsequently had a stenting of the native RCA she has an occluded vein graft to the right. She also has a stent in the native obtuse marginal branch. Her last catheter was in 2003. Her last Myoview in 2008 was nonischemic.her ejection fraction 64% at that time. She is not having t any significant chest pain. She has had a persistant dry cough with lisinipril Changed to cozaar in February She continues to need to work on her diet and carb intake. He BS has been ok as has her cholesterol as checked by her primary She had succesful gallbladder surgery in March. She had her F/U colonoscopy and there were no issues.  In February has extensive LLE DVT F/U duplex in August showed mild residual thrombus in left external iliac.  Compliant with coumadin  Being checked in [place] and has been too low Does not take coumadin on Sundays??  ROS: Denies fever, malais, weight loss, blurry vision, decreased visual acuity, cough, sputum, SOB, hemoptysis, pleuritic pain, palpitaitons, heartburn, abdominal pain, melena, lower extremity edema, claudication, or rash.  All other systems reviewed and negative  General: Affect appropriate Healthy:  appears stated age HEENT: normal Neck supple with no adenopathy JVP normal no bruits no thyromegaly Lungs clear with no wheezing and good diaphragmatic motion Heart:  S1/S2 no murmur, no rub, gallop or click PMI normal Abdomen: benighn, BS positve, no tenderness, no AAA no bruit.  No HSM or HJR Distal pulses intact with no bruits No edema Neuro non-focal Skin warm and dry No muscular weakness   Current Outpatient Prescriptions  Medication Sig Dispense Refill  . ALPRAZolam (XANAX) 0.5 MG tablet Take 0.5 mg by mouth at bedtime as needed for anxiety.       .  Cholecalciferol (VITAMIN D-3) 5000 UNITS TABS Take 10,000 Units by mouth.      . fenofibrate (TRICOR) 145 MG tablet Take 145 mg by mouth daily.        Marland Kitchen FLUoxetine (PROZAC) 20 MG capsule Take 20 mg by mouth daily.        Marland Kitchen HYDROcodone-acetaminophen (NORCO/VICODIN) 5-325 MG per tablet Take 1-2 tablets by mouth every 4 (four) hours as needed.  30 tablet  0  . levothyroxine (SYNTHROID, LEVOTHROID) 100 MCG tablet Take 100 mcg by mouth daily.        Marland Kitchen losartan (COZAAR) 50 MG tablet Take 1 tablet (50 mg total) by mouth daily.  90 tablet  2  . metoprolol (LOPRESSOR) 50 MG tablet Take 50 mg by mouth 2 (two) times daily.        . pantoprazole (PROTONIX) 40 MG tablet Take 40 mg by mouth daily.        . rosuvastatin (CRESTOR) 20 MG tablet Take 20 mg by mouth daily.        . vitamin C (ASCORBIC ACID) 500 MG tablet Take 500 mg by mouth daily.        . vitamin E 400 UNIT capsule Take 1,000 Units by mouth daily.       Marland Kitchen warfarin (COUMADIN) 6 MG tablet Take 1 tablet (6 mg total) by mouth daily.  7 tablet  0   No current facility-administered medications for this visit.    Allergies  Niaspan and Nitroglycerin  Electrocardiogram:  NSR no acute ischemic  changes  Assessment and Plan

## 2013-04-03 NOTE — Patient Instructions (Addendum)
Your physician wants you to follow-up in:   December WITH DR Eden Emms  AND  LEFT  LOWER DUPLEX   FOR   DVT  You will receive a reminder letter in the mail two months in advance. If you don't receive a letter, please call our office to schedule the follow-up appointment. Your physician recommends that you continue on your current medications as directed. Please refer to the Current Medication list given to you today. Your physician has requested that you have a lower or upper extremity venous duplex. This test is an ultrasound of the veins in the legs or arms. It looks at venous blood flow that carries blood from the heart to the legs or arms. Allow one hour for a Lower Venous exam. Allow thirty minutes for an Upper Venous exam. There are no restrictions or special instructions.  LOWER VENOUS  IN  December SEE DR Eden Emms SAME DAY

## 2013-04-03 NOTE — Assessment & Plan Note (Signed)
Cholesterol is at goal.  Continue current dose of statin and diet Rx.  No myalgias or side effects.  F/U  LFT's in 6 months. Lab Results  Component Value Date   LDLCALC 67 09/06/2012

## 2013-04-03 NOTE — Assessment & Plan Note (Signed)
Stable with no angina and good activity level.  Continue medical Rx  

## 2013-04-17 ENCOUNTER — Ambulatory Visit (INDEPENDENT_AMBULATORY_CARE_PROVIDER_SITE_OTHER): Payer: Medicare Other | Admitting: *Deleted

## 2013-04-17 DIAGNOSIS — I82409 Acute embolism and thrombosis of unspecified deep veins of unspecified lower extremity: Secondary | ICD-10-CM

## 2013-04-17 DIAGNOSIS — Z7901 Long term (current) use of anticoagulants: Secondary | ICD-10-CM

## 2013-04-19 ENCOUNTER — Telehealth: Payer: Self-pay | Admitting: Cardiovascular Disease

## 2013-04-19 NOTE — Telephone Encounter (Signed)
New problem   Pt is having extractions and need to know how long does pt need to be off blood thinner.

## 2013-04-19 NOTE — Telephone Encounter (Signed)
LMTCB ./CY 

## 2013-04-23 NOTE — Telephone Encounter (Signed)
Initial DVT was 2/14  Last [place] improved OK to stop coumadin 4 days before extraction and resume night after

## 2013-04-23 NOTE — Telephone Encounter (Signed)
PT  IS  CURRENTLY TAKING  WARFARIN FOR  DVT  WILL FORWARD TO DR Eden Emms FOR REVIEW HAS  REPEAT  LOWER EXTREMITY  DUPLEX  IN DEC AS WELL AS  FOLLOW UP  SAME DAY .Zack Seal

## 2013-04-23 NOTE — Telephone Encounter (Signed)
Rachel Conner OKAY  FOR PT  TO HOLD  COUMADIN  DIRECTIONS  GIVEN PER DR NISHAN./CY

## 2013-05-01 ENCOUNTER — Ambulatory Visit (INDEPENDENT_AMBULATORY_CARE_PROVIDER_SITE_OTHER): Payer: Medicare Other | Admitting: *Deleted

## 2013-05-01 DIAGNOSIS — I82409 Acute embolism and thrombosis of unspecified deep veins of unspecified lower extremity: Secondary | ICD-10-CM

## 2013-05-01 DIAGNOSIS — I82403 Acute embolism and thrombosis of unspecified deep veins of lower extremity, bilateral: Secondary | ICD-10-CM

## 2013-05-01 DIAGNOSIS — Z7901 Long term (current) use of anticoagulants: Secondary | ICD-10-CM

## 2013-05-01 LAB — POCT INR: INR: 3.3

## 2013-05-17 ENCOUNTER — Other Ambulatory Visit: Payer: Self-pay

## 2013-05-25 ENCOUNTER — Ambulatory Visit (INDEPENDENT_AMBULATORY_CARE_PROVIDER_SITE_OTHER): Payer: Medicare Other | Admitting: *Deleted

## 2013-05-25 DIAGNOSIS — I82403 Acute embolism and thrombosis of unspecified deep veins of lower extremity, bilateral: Secondary | ICD-10-CM

## 2013-05-25 DIAGNOSIS — Z7901 Long term (current) use of anticoagulants: Secondary | ICD-10-CM

## 2013-05-25 DIAGNOSIS — I82409 Acute embolism and thrombosis of unspecified deep veins of unspecified lower extremity: Secondary | ICD-10-CM

## 2013-05-25 LAB — POCT INR: INR: 3.2

## 2013-06-14 ENCOUNTER — Encounter: Payer: Self-pay | Admitting: Cardiovascular Disease

## 2013-06-14 ENCOUNTER — Ambulatory Visit (INDEPENDENT_AMBULATORY_CARE_PROVIDER_SITE_OTHER): Payer: Medicare Other | Admitting: Cardiology

## 2013-06-14 ENCOUNTER — Encounter: Payer: Self-pay | Admitting: Cardiology

## 2013-06-14 ENCOUNTER — Ambulatory Visit (INDEPENDENT_AMBULATORY_CARE_PROVIDER_SITE_OTHER): Payer: Medicare Other | Admitting: Cardiovascular Disease

## 2013-06-14 ENCOUNTER — Ambulatory Visit (INDEPENDENT_AMBULATORY_CARE_PROVIDER_SITE_OTHER): Payer: Medicare Other | Admitting: *Deleted

## 2013-06-14 ENCOUNTER — Ambulatory Visit (HOSPITAL_COMMUNITY): Payer: Medicare Other | Attending: Cardiology

## 2013-06-14 VITALS — BP 148/78 | HR 76 | Ht 64.0 in | Wt 218.0 lb

## 2013-06-14 DIAGNOSIS — I2510 Atherosclerotic heart disease of native coronary artery without angina pectoris: Secondary | ICD-10-CM | POA: Insufficient documentation

## 2013-06-14 DIAGNOSIS — Z951 Presence of aortocoronary bypass graft: Secondary | ICD-10-CM | POA: Insufficient documentation

## 2013-06-14 DIAGNOSIS — I82409 Acute embolism and thrombosis of unspecified deep veins of unspecified lower extremity: Secondary | ICD-10-CM

## 2013-06-14 DIAGNOSIS — Z79899 Other long term (current) drug therapy: Secondary | ICD-10-CM

## 2013-06-14 DIAGNOSIS — I82403 Acute embolism and thrombosis of unspecified deep veins of lower extremity, bilateral: Secondary | ICD-10-CM

## 2013-06-14 DIAGNOSIS — Z7901 Long term (current) use of anticoagulants: Secondary | ICD-10-CM

## 2013-06-14 DIAGNOSIS — I87009 Postthrombotic syndrome without complications of unspecified extremity: Secondary | ICD-10-CM | POA: Insufficient documentation

## 2013-06-14 DIAGNOSIS — I82402 Acute embolism and thrombosis of unspecified deep veins of left lower extremity: Secondary | ICD-10-CM

## 2013-06-14 DIAGNOSIS — I10 Essential (primary) hypertension: Secondary | ICD-10-CM | POA: Insufficient documentation

## 2013-06-14 DIAGNOSIS — E7800 Pure hypercholesterolemia, unspecified: Secondary | ICD-10-CM

## 2013-06-14 DIAGNOSIS — I825Y9 Chronic embolism and thrombosis of unspecified deep veins of unspecified proximal lower extremity: Secondary | ICD-10-CM | POA: Insufficient documentation

## 2013-06-14 DIAGNOSIS — Z86718 Personal history of other venous thrombosis and embolism: Secondary | ICD-10-CM | POA: Insufficient documentation

## 2013-06-14 DIAGNOSIS — E785 Hyperlipidemia, unspecified: Secondary | ICD-10-CM | POA: Insufficient documentation

## 2013-06-14 DIAGNOSIS — I119 Hypertensive heart disease without heart failure: Secondary | ICD-10-CM

## 2013-06-14 LAB — BASIC METABOLIC PANEL
BUN: 21 mg/dL (ref 6–23)
Calcium: 9.2 mg/dL (ref 8.4–10.5)
Chloride: 106 mEq/L (ref 96–112)
Creatinine, Ser: 1 mg/dL (ref 0.4–1.2)
GFR: 58.76 mL/min — ABNORMAL LOW (ref >60.00)
Glucose, Bld: 88 mg/dL (ref 70–99)

## 2013-06-14 LAB — CBC WITH DIFFERENTIAL/PLATELET
Basophils Absolute: 0 10*3/uL (ref 0.0–0.1)
Basophils Relative: 0.4 % (ref 0.0–3.0)
Eosinophils Absolute: 0.3 10*3/uL (ref 0.0–0.7)
Eosinophils Relative: 3.7 % (ref 0.0–5.0)
HCT: 37.6 % (ref 36.0–46.0)
Hemoglobin: 12.4 g/dL (ref 12.0–15.0)
Lymphocytes Relative: 33.9 % (ref 12.0–46.0)
Lymphs Abs: 2.5 10*3/uL (ref 0.7–4.0)
MCHC: 33.1 g/dL (ref 30.0–36.0)
MCV: 87.6 fl (ref 78.0–100.0)
Monocytes Absolute: 0.7 10*3/uL (ref 0.1–1.0)
Neutro Abs: 3.9 10*3/uL (ref 1.4–7.7)
RBC: 4.3 Mil/uL (ref 3.87–5.11)

## 2013-06-14 MED ORDER — RIVAROXABAN 15 MG PO TABS: 15.0000 mg | ORAL_TABLET | Freq: Two times a day (BID) | ORAL | Status: DC

## 2013-06-14 MED ORDER — RIVAROXABAN 15 MG PO TABS: 15.0000 mg | ORAL_TABLET | Freq: Every day | ORAL | Status: DC

## 2013-06-14 NOTE — Assessment & Plan Note (Signed)
INR 1.6 today Inconsistant anticoagulation  Change to xarelto 15 mg  Samples given CBC and BMET today

## 2013-06-14 NOTE — Assessment & Plan Note (Signed)
Well controlled.  Continue current medications and low sodium Dash type diet.    

## 2013-06-14 NOTE — Progress Notes (Signed)
Patient ID: Rachel Conner, female   DOB: July 13, 1935, 77 y.o.   MRN: [id number] [name] is seen today in followup for history of coronary artery disease and bypass surgery. I believe her bypass was in 99. He subsequently had a stenting of the native RCA she has an occluded vein graft to the right. She also has a stent in the native obtuse marginal branch. Her last catheter was in 2003. Her last Myoview in 2008 was nonischemic.her ejection fraction 64% at that time. She is not having t any significant chest pain. She has had a persistant dry cough with lisinipril Changed to cozaar in February She continues to need to work on her diet and carb intake. He BS has been ok as has her cholesterol as checked by her primary She had succesful gallbladder surgery in March. She had her F/U colonoscopy and there were no issues. In February has extensive LLE DVT F/U duplex in August showed mild residual thrombus in left external iliac. Compliant with coumadin Being checked in [place] and has been too low   Repeat duplex here today still shows large clot burden in left femoral and iliac with limited phasicity.  Discussed changing to Xarelto 15 mg for DVT since it will provide more consistant anticoagulaiton    ROS: Denies fever, malais, weight loss, blurry vision, decreased visual acuity, cough, sputum, SOB, hemoptysis, pleuritic pain, palpitaitons, heartburn, abdominal pain, melena, lower extremity edema, claudication, or rash.  All other systems reviewed and negative  General: Affect appropriate Chronically ill white irish female  HEENT: normal Neck supple with no adenopathy JVP normal no bruits no thyromegaly Lungs clear with no wheezing and good diaphragmatic motion Heart:  S1/S2 no murmur, no rub, gallop or click PMI normal Abdomen: benighn, BS positve, no tenderness, no AAA no bruit.  No HSM or HJR Distal pulses intact with no bruits No edema Neuro non-focal Skin warm and dry No muscular  weakness   Current Outpatient Prescriptions  Medication Sig Dispense Refill  . ALPRAZolam (XANAX) 0.5 MG tablet Take 0.5 mg by mouth at bedtime as needed for anxiety.       . Cholecalciferol (VITAMIN D-3) 5000 UNITS TABS Take 10,000 Units by mouth.      . fenofibrate (TRICOR) 145 MG tablet Take 145 mg by mouth daily.        Marland Kitchen FLUoxetine (PROZAC) 20 MG capsule Take 20 mg by mouth daily.        Marland Kitchen HYDROcodone-acetaminophen (NORCO/VICODIN) 5-325 MG per tablet Take 1-2 tablets by mouth every 4 (four) hours as needed.  30 tablet  0  . levothyroxine (SYNTHROID, LEVOTHROID) 100 MCG tablet Take 100 mcg by mouth daily.        Marland Kitchen losartan (COZAAR) 50 MG tablet Take 1 tablet (50 mg total) by mouth daily.  90 tablet  2  . metoprolol (LOPRESSOR) 50 MG tablet Take 50 mg by mouth 2 (two) times daily.        . pantoprazole (PROTONIX) 40 MG tablet Take 40 mg by mouth daily.        . rosuvastatin (CRESTOR) 20 MG tablet Take 20 mg by mouth daily.        . vitamin C (ASCORBIC ACID) 500 MG tablet Take 500 mg by mouth daily.        . vitamin E 400 UNIT capsule Take 1,000 Units by mouth daily.       Marland Kitchen warfarin (COUMADIN) 3 MG tablet Take 3 mg by mouth as directed. Take 1  1/2 tablets daily except 2 tablets on Tuesdays       No current facility-administered medications for this visit.    Allergies  Myrbetriq; Niaspan; and Nitroglycerin  Electrocardiogram:  09/11/12 SR rate 64 isolated Q in lead 3 othewise normal   Assessment and Plan

## 2013-06-14 NOTE — Assessment & Plan Note (Signed)
Cholesterol is at goal.  Continue current dose of statin and diet Rx.  No myalgias or side effects.  F/U  LFT's in 6 months. Lab Results  Component Value Date   LDLCALC 67 09/06/2012

## 2013-06-14 NOTE — Patient Instructions (Addendum)
Your physician wants you to follow-up in: 6 MONTHS WITH DR Haywood Filler will receive a reminder letter in the mail two months in advance. If you don't receive a letter, please call our office to schedule the follow-up appointment.   Your physician has recommended you make the following change in your medication:  STOP  WARFARIN XARELTO  15 MG   1 TAB  DAILY  STARTING THIS  EVENING  Your physician recommends that you return for lab work in:  TODAY  BMET  CBC

## 2013-06-14 NOTE — Assessment & Plan Note (Signed)
Stable with no angina and good activity level.  Continue medical Rx  

## 2013-10-06 ENCOUNTER — Encounter (HOSPITAL_COMMUNITY): Payer: Self-pay | Admitting: Emergency Medicine

## 2013-10-06 ENCOUNTER — Emergency Department (HOSPITAL_COMMUNITY): Payer: Medicare Other

## 2013-10-06 ENCOUNTER — Emergency Department (HOSPITAL_COMMUNITY)
Admission: EM | Admit: 2013-10-06 | Discharge: 2013-10-06 | Disposition: A | Discharge status: Home / Self Care | Payer: Medicare Other | Attending: Emergency Medicine | Admitting: Emergency Medicine

## 2013-10-06 DIAGNOSIS — Z86718 Personal history of other venous thrombosis and embolism: Secondary | ICD-10-CM | POA: Insufficient documentation

## 2013-10-06 DIAGNOSIS — F411 Generalized anxiety disorder: Secondary | ICD-10-CM | POA: Insufficient documentation

## 2013-10-06 DIAGNOSIS — Z8719 Personal history of other diseases of the digestive system: Secondary | ICD-10-CM | POA: Insufficient documentation

## 2013-10-06 DIAGNOSIS — F329 Major depressive disorder, single episode, unspecified: Secondary | ICD-10-CM | POA: Insufficient documentation

## 2013-10-06 DIAGNOSIS — Z7982 Long term (current) use of aspirin: Secondary | ICD-10-CM | POA: Insufficient documentation

## 2013-10-06 DIAGNOSIS — R42 Dizziness and giddiness: Secondary | ICD-10-CM

## 2013-10-06 DIAGNOSIS — Z860100 Personal history of colon polyps, unspecified: Secondary | ICD-10-CM | POA: Insufficient documentation

## 2013-10-06 DIAGNOSIS — I2510 Atherosclerotic heart disease of native coronary artery without angina pectoris: Secondary | ICD-10-CM | POA: Insufficient documentation

## 2013-10-06 DIAGNOSIS — E119 Type 2 diabetes mellitus without complications: Secondary | ICD-10-CM | POA: Insufficient documentation

## 2013-10-06 DIAGNOSIS — Z87891 Personal history of nicotine dependence: Secondary | ICD-10-CM | POA: Insufficient documentation

## 2013-10-06 DIAGNOSIS — R001 Bradycardia, unspecified: Secondary | ICD-10-CM

## 2013-10-06 DIAGNOSIS — I10 Essential (primary) hypertension: Secondary | ICD-10-CM | POA: Insufficient documentation

## 2013-10-06 DIAGNOSIS — E039 Hypothyroidism, unspecified: Secondary | ICD-10-CM | POA: Insufficient documentation

## 2013-10-06 DIAGNOSIS — I252 Old myocardial infarction: Secondary | ICD-10-CM | POA: Insufficient documentation

## 2013-10-06 DIAGNOSIS — Z7901 Long term (current) use of anticoagulants: Secondary | ICD-10-CM | POA: Insufficient documentation

## 2013-10-06 DIAGNOSIS — Z951 Presence of aortocoronary bypass graft: Secondary | ICD-10-CM | POA: Insufficient documentation

## 2013-10-06 DIAGNOSIS — I498 Other specified cardiac arrhythmias: Secondary | ICD-10-CM | POA: Insufficient documentation

## 2013-10-06 DIAGNOSIS — Z79899 Other long term (current) drug therapy: Secondary | ICD-10-CM | POA: Insufficient documentation

## 2013-10-06 DIAGNOSIS — M129 Arthropathy, unspecified: Secondary | ICD-10-CM | POA: Insufficient documentation

## 2013-10-06 DIAGNOSIS — Z9889 Other specified postprocedural states: Secondary | ICD-10-CM | POA: Insufficient documentation

## 2013-10-06 HISTORY — DX: Acute embolism and thrombosis of unspecified deep veins of unspecified lower extremity: I82.409

## 2013-10-06 HISTORY — DX: Major depressive disorder, single episode, unspecified: F32.9

## 2013-10-06 HISTORY — DX: Acute myocardial infarction, unspecified: I21.9

## 2013-10-06 LAB — I-STAT TROPONIN, ED: TROPONIN I, POC: 0 ng/mL (ref 0.00–0.08)

## 2013-10-06 LAB — COMPREHENSIVE METABOLIC PANEL
ALBUMIN: 3.4 g/dL — AB (ref 3.5–5.2)
ALT: 17 U/L (ref 0–35)
AST: 23 U/L (ref 0–37)
Alkaline Phosphatase: 40 U/L (ref 39–117)
BILIRUBIN TOTAL: 0.5 mg/dL (ref 0.3–1.2)
BUN: 15 mg/dL (ref 6–23)
CHLORIDE: 102 meq/L (ref 96–112)
CO2: 27 mEq/L (ref 19–32)
Calcium: 9.6 mg/dL (ref 8.4–10.5)
Creatinine, Ser: 0.85 mg/dL (ref 0.50–1.10)
GFR calc non Af Amer: 65 mL/min — ABNORMAL LOW (ref >90)
GFR, EST AFRICAN AMERICAN: 76 mL/min — AB (ref >90)
GLUCOSE: 117 mg/dL — AB (ref 70–99)
Potassium: 4.2 mEq/L (ref 3.7–5.3)
Sodium: 141 mEq/L (ref 137–147)
Total Protein: 6.8 g/dL (ref 6.0–8.3)

## 2013-10-06 LAB — CBC WITH DIFFERENTIAL/PLATELET
BASOS PCT: 0 % (ref 0–1)
Basophils Absolute: 0 10*3/uL (ref 0.0–0.1)
Eosinophils Absolute: 0.2 10*3/uL (ref 0.0–0.7)
Eosinophils Relative: 2 % (ref 0–5)
HEMATOCRIT: 40.3 % (ref 36.0–46.0)
HEMOGLOBIN: 13.3 g/dL (ref 12.0–15.0)
Lymphocytes Relative: 20 % (ref 12–46)
Lymphs Abs: 1.5 10*3/uL (ref 0.7–4.0)
MCH: 29.7 pg (ref 26.0–34.0)
MCHC: 33 g/dL (ref 30.0–36.0)
MCV: 90 fL (ref 78.0–100.0)
MONO ABS: 0.5 10*3/uL (ref 0.1–1.0)
MONOS PCT: 7 % (ref 3–12)
NEUTROS ABS: 5.6 10*3/uL (ref 1.7–7.7)
Neutrophils Relative %: 71 % (ref 43–77)
Platelets: 255 10*3/uL (ref 150–400)
RBC: 4.48 MIL/uL (ref 3.87–5.11)
RDW: 14.7 % (ref 11.5–15.5)
WBC: 7.9 10*3/uL (ref 4.0–10.5)

## 2013-10-06 LAB — PROTIME-INR
INR: 1.26 (ref 0.00–1.49)
Prothrombin Time: 15.5 seconds — ABNORMAL HIGH (ref 11.6–15.2)

## 2013-10-06 MED ORDER — SODIUM CHLORIDE 0.9 % IV BOLUS (SEPSIS)
1000.0000 mL | Freq: Once | INTRAVENOUS | Status: AC
  Administered 2013-10-06: 1000 mL via INTRAVENOUS

## 2013-10-06 NOTE — Discharge Instructions (Signed)
Stop taking your metoprolol. Take your other medicines as prescribed.   See your doctor in a week to recheck blood pressure and heart rate.   You should see [facility] neurologist for further evaluation.   Return to ER if you have passing out, chest pain, shortness of breath, falling.

## 2013-10-06 NOTE — ED Notes (Signed)
Pt from home, c/o near syncopal episode . Pt states she was sitting at dinning room, felt weak almost pass out. Pt alert and oriented at this time. Per ems pt took morning meds 1 hr prior to episode.

## 2013-10-06 NOTE — ED Provider Notes (Signed)
CSN: [id number]     Arrival date & time 10/06/13  1019 History   First MD Initiated Contact with Patient 10/06/13 1020     Chief Complaint  Patient presents with  . Near Syncope     (Consider location/radiation/quality/duration/timing/severity/associated sxs/prior Treatment) The history is provided by the patient.  Rachel Conner is a 78 y.o. female hx of arthritis, CAD s/p MIs, DVT on xarelto here with near syncope. She took her medicines this morning, including her Lopressor and Cozaar. About an hour later she was at night kitchen and suddenly felt lightheaded dizzy. She almost passed out but did not actually pass out or fall or hit her head. Denies any chest pain or shortness of breath at the time. Denies any fevers or chills or abdominal pain or vomiting. She was noted to be bradycardic to the 40-50s by EMS. CBG was 109 by EMS.    Past Medical History  Diagnosis Date  . Arthritis   . CAD (coronary artery disease) 1999    CABG  . Diabetes mellitus   . HLD (hyperlipidemia)   . HTN (hypertension)   . Hypothyroidism   . Colon polyps   . Hiatal hernia   . Peripheral neuropathy   . Anxiety   . Hemorrhoids   . Major depressive disorder   . MI (myocardial infarction)   . DVT (deep venous thrombosis)    Past Surgical History  Procedure Laterality Date  . Coronary artery bypass graft  1999  . Carotid stent  07/1998    stent RCA   . Coronary stent placement  10/2001    circumflex stent  . Back surgery    . Thyroid surgery    . Cholecystectomy     Family History  Problem Relation Age of Onset  . Heart disease Father   . Heart disease Mother   . Heart disease Brother   . Heart disease Other     grandfather (side unknown)  . Colon cancer Neg Hx    History  Substance Use Topics  . Smoking status: Former Research scientist (life sciences)  . Smokeless tobacco: Never Used  . Alcohol Use: No   OB History   Grav Para Term Preterm Abortions TAB SAB Ect Mult Living                 Review of Systems   Cardiovascular: Positive for near-syncope.  Neurological: Positive for dizziness.       Near syncope   All other systems reviewed and are negative.      Allergies  Myrbetriq; Niaspan; and Nitroglycerin  Home Medications   Current Outpatient Rx  Name  Route  Sig  Dispense  Refill  . aspirin EC 81 MG tablet   Oral   Take 81-162 mg by mouth daily as needed for mild pain.         . Cholecalciferol (VITAMIN D-3) 5000 UNITS TABS   Oral   Take 10,000 Units by mouth.         . citalopram (CELEXA) 10 MG tablet   Oral   Take 10 mg by mouth every evening.         Marland Kitchen esomeprazole (NEXIUM) 40 MG capsule   Oral   Take 40 mg by mouth every morning.         . fenofibrate (TRICOR) 145 MG tablet   Oral   Take 145 mg by mouth daily.           . hydrOXYzine (ATARAX/VISTARIL) 10 MG tablet  Oral   Take 10 mg by mouth daily as needed for anxiety.         Marland Kitchen levothyroxine (SYNTHROID, LEVOTHROID) 100 MCG tablet   Oral   Take 100 mcg by mouth daily.           . Melatonin 1 MG TABS   Oral   Take 1 mg by mouth at bedtime.         . metoprolol (LOPRESSOR) 50 MG tablet   Oral   Take 50 mg by mouth 2 (two) times daily.           Marland Kitchen omega-3 acid ethyl esters (LOVAZA) 1 G capsule   Oral   Take 1 g by mouth every morning.         . Rivaroxaban (XARELTO) 15 MG TABS tablet   Oral   Take 1 tablet (15 mg total) by mouth daily with supper.   90 tablet   3     PLEASE  DISREGARD FIRST  ORDER   PT ONLY  TAKING   ...   . rosuvastatin (CRESTOR) 20 MG tablet   Oral   Take 20 mg by mouth daily.           . sennosides-docusate sodium (SENOKOT-S) 8.6-50 MG tablet   Oral   Take 1 tablet by mouth daily as needed for constipation.         . traZODone (DESYREL) 50 MG tablet   Oral   Take 50 mg by mouth at bedtime.         . vitamin B-12 (CYANOCOBALAMIN) 500 MCG tablet   Oral   Take 500 mcg by mouth daily.         . vitamin E 400 UNIT capsule   Oral   Take 400  Units by mouth every morning.           BP 147/59  Pulse 59  Temp(Src) 97.6 F (36.4 C) (Oral)  Resp 20  SpO2 98% Physical Exam  Nursing note and vitals reviewed. Constitutional: She is oriented to person, place, and time. She appears well-developed and well-nourished.  NAD, well appearing   HENT:  Head: Normocephalic.  Mouth/Throat: Oropharynx is clear and moist.  Eyes: Conjunctivae and EOM are normal. Pupils are equal, round, and reactive to light.  Neck: Normal range of motion. Neck supple.  Cardiovascular: Normal rate, regular rhythm and normal heart sounds.   Pulmonary/Chest: Effort normal and breath sounds normal. No respiratory distress. She has no wheezes. She has no rales.  Abdominal: Soft. Bowel sounds are normal. She exhibits no distension. There is no tenderness. There is no rebound and no guarding.  Musculoskeletal: Normal range of motion.  L calf more swollen (hx of recent DVT, unchanged per patient)  Neurological: She is alert and oriented to person, place, and time. No cranial nerve deficit. Coordination normal.  Skin: Skin is warm and dry.  Psychiatric: She has a normal mood and affect. Her behavior is normal. Judgment and thought content normal.    ED Course  Procedures (including critical care time) Labs Review Labs Reviewed  COMPREHENSIVE METABOLIC PANEL - Abnormal; Notable for the following:    Glucose, Bld 117 (*)    Albumin 3.4 (*)    GFR calc non Af Amer 65 (*)    GFR calc Af Amer 76 (*)    All other components within normal limits  PROTIME-INR - Abnormal; Notable for the following:    Prothrombin Time 15.5 (*)    All  other components within normal limits  CBC WITH DIFFERENTIAL  Randolm Idol, ED   Imaging Review Dg Chest 2 View  10/06/2013   CLINICAL DATA:  Near syncope.  EXAM: CHEST  2 VIEW  COMPARISON:  09/08/2012  FINDINGS: Prior CABG. Heart is normal size. Lungs are clear. No effusions. No acute bony abnormality.  IMPRESSION: Prior CABG.   No active disease.   Electronically Signed   By: Rolm Baptise M.D.   On: 10/06/2013 13:02     EKG Interpretation   Date/Time:  Saturday October 06 2013 10:32:47 EDT Ventricular Rate:  55 PR Interval:  240 QRS Duration: 92 QT Interval:  500 QTC Calculation: 478 R Axis:   39 Text Interpretation:  Sinus rhythm Prolonged PR interval Probable  anteroseptal infarct, old Since last tracing rate slower Confirmed by YAO   MD, DAVID ([id number]) on 10/06/2013 10:34:43 AM      MDM   Final diagnoses:  None   DASHANTI BURR is a 78 y.o. female here with near syncope, bradycardia. I think its likely related to the metoprolol that she took this AM. i doubt ACS as she has no chest pain. Doesn't appear dehydrated. I doubt PE as she is taking xarelto and is not tachycardic. Will get orthostatics. Will check labs and monitor HR.    1:20 PM Patient not orthostatic. Labs stable. CXR unremarkable. HR increased to 60s after several hours. I think she likely has symptomatic bradycardic from metoprolol use. Recommend d/c metoprolol, f/u outpatient.    Wandra Arthurs, MD 10/06/13 1321

## 2013-10-06 NOTE — ED Notes (Signed)
Patient transported to X-ray 

## 2013-11-07 ENCOUNTER — Ambulatory Visit (INDEPENDENT_AMBULATORY_CARE_PROVIDER_SITE_OTHER): Payer: Medicare Other | Admitting: Cardiovascular Disease

## 2013-11-07 ENCOUNTER — Encounter: Payer: Self-pay | Admitting: Cardiovascular Disease

## 2013-11-07 ENCOUNTER — Encounter: Payer: Self-pay | Admitting: *Deleted

## 2013-11-07 VITALS — BP 180/90 | HR 70 | Wt 204.0 lb

## 2013-11-07 DIAGNOSIS — Z79899 Other long term (current) drug therapy: Secondary | ICD-10-CM

## 2013-11-07 DIAGNOSIS — Z7901 Long term (current) use of anticoagulants: Secondary | ICD-10-CM

## 2013-11-07 DIAGNOSIS — F0390 Unspecified dementia without behavioral disturbance: Secondary | ICD-10-CM

## 2013-11-07 DIAGNOSIS — E7800 Pure hypercholesterolemia, unspecified: Secondary | ICD-10-CM

## 2013-11-07 DIAGNOSIS — I119 Hypertensive heart disease without heart failure: Secondary | ICD-10-CM

## 2013-11-07 DIAGNOSIS — F0150 Vascular dementia without behavioral disturbance: Secondary | ICD-10-CM

## 2013-11-07 DIAGNOSIS — I2510 Atherosclerotic heart disease of native coronary artery without angina pectoris: Secondary | ICD-10-CM

## 2013-11-07 LAB — CBC WITH DIFFERENTIAL/PLATELET
Basophils Absolute: 0 10*3/uL (ref 0.0–0.1)
Basophils Relative: 0.5 % (ref 0.0–3.0)
Eosinophils Absolute: 0.4 10*3/uL (ref 0.0–0.7)
Eosinophils Relative: 4.2 % (ref 0.0–5.0)
HCT: 40.9 % (ref 36.0–46.0)
Hemoglobin: 13.3 g/dL (ref 12.0–15.0)
LYMPHS ABS: 3 10*3/uL (ref 0.7–4.0)
Lymphocytes Relative: 35.4 % (ref 12.0–46.0)
MCHC: 32.6 g/dL (ref 30.0–36.0)
MCV: 89.3 fl (ref 78.0–100.0)
MONO ABS: 0.7 10*3/uL (ref 0.1–1.0)
Monocytes Relative: 7.9 % (ref 3.0–12.0)
Neutro Abs: 4.4 10*3/uL (ref 1.4–7.7)
Neutrophils Relative %: 52 % (ref 43.0–77.0)
PLATELETS: 286 10*3/uL (ref 150.0–400.0)
RBC: 4.58 Mil/uL (ref 3.87–5.11)
RDW: 15.8 % — ABNORMAL HIGH (ref 11.5–14.6)
WBC: 8.5 10*3/uL (ref 4.5–10.5)

## 2013-11-07 LAB — LIPID PANEL
CHOLESTEROL: 166 mg/dL (ref 0–200)
HDL: 47 mg/dL (ref >39.00)
LDL Cholesterol: 81 mg/dL (ref 0–99)
TRIGLYCERIDES: 191 mg/dL — AB (ref 0.0–149.0)
Total CHOL/HDL Ratio: 4
VLDL: 38.2 mg/dL (ref 0.0–40.0)

## 2013-11-07 LAB — HEPATIC FUNCTION PANEL
ALK PHOS: 38 U/L — AB (ref 39–117)
ALT: 20 U/L (ref 0–35)
AST: 24 U/L (ref 0–37)
Albumin: 3.9 g/dL (ref 3.5–5.2)
BILIRUBIN DIRECT: 0.1 mg/dL (ref 0.0–0.3)
TOTAL PROTEIN: 7 g/dL (ref 6.0–8.3)
Total Bilirubin: 0.6 mg/dL (ref 0.3–1.2)

## 2013-11-07 MED ORDER — LOSARTAN POTASSIUM-HCTZ 50-12.5 MG PO TABS: 1.0000 | ORAL_TABLET | Freq: Every day | ORAL | Status: DC

## 2013-11-07 MED ORDER — AMLODIPINE BESYLATE 10 MG PO TABS: 10.0000 mg | ORAL_TABLET | Freq: Every day | ORAL | Status: DC

## 2013-11-07 NOTE — Assessment & Plan Note (Signed)
Stable with no angina and good activity level.  Continue medical Rx  

## 2013-11-07 NOTE — Assessment & Plan Note (Signed)
Change cozaar to hyzaar and add amlodipine  F/U next available

## 2013-11-07 NOTE — Assessment & Plan Note (Signed)
Cholesterol is at goal.  Continue current dose of statin and diet Rx.  No myalgias or side effects.  F/U  LFT's in 6 months. Lab Results  Component Value Date   LDLCALC 67 09/06/2012  Labs including LFTls today

## 2013-11-07 NOTE — Patient Instructions (Addendum)
Your physician recommends that you schedule a follow-up appointment in: [place]  3-4  WEEKS Your physician has recommended you make the following change in your medication:  ADD  AMLODPINE  10 MG  EVERY DAY LOSARTAN/HCT  100/12.5 MG   EVERY DAY  Your physician recommends that you return for lab work in:  TODAY  CBC  LIPID LIVER  You have been referred to DR  TAT   DX  [place]

## 2013-11-07 NOTE — Progress Notes (Signed)
Patient ID: Rachel Conner, female   DOB: 1935/10/14, 78 y.o.   MRN: [id number] F[place]a is seen today in followup for history of coronary artery disease and bypass surgery. I believe her bypass was in 99. He subsequently had a stenting of the native RCA she has an occluded vein graft to the right. She also has a stent in the native obtuse marginal branch. Her last catheter was in 2003. Her last Myoview in 2008 was nonischemic.her ejection fraction 64% at that time. She is not having t any significant chest pain. She has had a persistant dry cough with lisinipril Changed to cozaar in February She continues to need to work on her diet and carb intake. He BS has been ok as has her cholesterol as checked by her primary She had succesful gallbladder surgery in March. She had her F/U colonoscopy and there were no issues. In February has extensive LLE DVT F/U duplex in August showed mild residual thrombus in left external iliac. Compliant with coumadin Being checked in [place] and has been too low Repeat duplex here today still shows large clot burden in left femoral and iliac with limited phasicity. Discussed changing to Xarelto 15 mg for DVT since it will provide more consistant anticoagulaiton  Having more memory problems  Seen at [facility] recently ? Vascular dementia  HR low and lopressor stopped  Husband died in 04-28-2023 BP not adequately controlled    ROS: Denies fever, malais, weight loss, blurry vision, decreased visual acuity, cough, sputum, SOB, hemoptysis, pleuritic pain, palpitaitons, heartburn, abdominal pain, melena, lower extremity edema, claudication, or rash.  All other systems reviewed and negative  General: Affect appropriate Healthy:  appears stated age 65: normal Neck supple with no adenopathy JVP normal no bruits no thyromegaly Lungs clear with no wheezing and good diaphragmatic motion Heart:  S1/S2 no murmur, no rub, gallop or click PMI normal Abdomen: benighn, BS positve, no  tenderness, no AAA no bruit.  No HSM or HJR Distal pulses intact with no bruits No edema Neuro non-focal Skin warm and dry No muscular weakness   Current Outpatient Prescriptions  Medication Sig Dispense Refill  . aspirin EC 81 MG tablet Take 81-162 mg by mouth daily as needed for mild pain.      . Cholecalciferol (VITAMIN D-3) 5000 UNITS TABS Take 10,000 Units by mouth.      . citalopram (CELEXA) 10 MG tablet Take 10 mg by mouth every evening.      . fenofibrate (TRICOR) 145 MG tablet Take 145 mg by mouth daily.        . hydrOXYzine (ATARAX/VISTARIL) 10 MG tablet Take 10 mg by mouth daily as needed for anxiety.      Marland Kitchen levothyroxine (SYNTHROID, LEVOTHROID) 100 MCG tablet Take 100 mcg by mouth daily.        Marland Kitchen losartan (COZAAR) 50 MG tablet Take 50 mg by mouth daily.      . Melatonin 1 MG TABS Take 1 mg by mouth at bedtime.      . metoprolol (LOPRESSOR) 50 MG tablet Take 50 mg by mouth 2 (two) times daily.        Marland Kitchen omega-3 acid ethyl esters (LOVAZA) 1 G capsule Take 1 g by mouth every morning.      . pantoprazole (PROTONIX) 40 MG tablet Take 40 mg by mouth daily.      . Rivaroxaban (XARELTO) 15 MG TABS tablet Take 1 tablet (15 mg total) by mouth daily with supper.  90 tablet  3  . rosuvastatin (CRESTOR) 20 MG tablet Take 20 mg by mouth daily.        . solifenacin (VESICARE) 10 MG tablet Take by mouth daily.      . traZODone (DESYREL) 50 MG tablet Take 50 mg by mouth at bedtime.      . vitamin B-12 (CYANOCOBALAMIN) 500 MCG tablet Take 500 mcg by mouth daily.      . vitamin E 400 UNIT capsule Take 400 Units by mouth every morning.       . warfarin (COUMADIN) 3 MG tablet Take 3 mg by mouth daily.       No current facility-administered medications for this visit.    Allergies  Myrbetriq; Niaspan; and Nitroglycerin  Electrocardiogram: SR rate 55 PR 240  Poor R wave progression   Assessment and Plan

## 2013-11-07 NOTE — Assessment & Plan Note (Signed)
Refer to Dr Tat  Have asked my nurse to get CT report from [facility] change in cognitive ability over last 3 years Try to avoid aricept with recent bradycardia on lopressor

## 2013-11-07 NOTE — Assessment & Plan Note (Signed)
Xarelto working better than coumadin  No bleeding issues

## 2013-11-30 ENCOUNTER — Other Ambulatory Visit: Payer: Self-pay

## 2013-11-30 MED ORDER — AMLODIPINE BESYLATE 10 MG PO TABS: 10.0000 mg | ORAL_TABLET | Freq: Every day | ORAL | Status: DC

## 2013-11-30 MED ORDER — LOSARTAN POTASSIUM-HCTZ 50-12.5 MG PO TABS: 1.0000 | ORAL_TABLET | Freq: Every day | ORAL | Status: DC

## 2013-12-04 ENCOUNTER — Ambulatory Visit: Payer: Medicare Other | Admitting: Neurology

## 2013-12-04 ENCOUNTER — Telehealth: Payer: Self-pay | Admitting: Neurology

## 2013-12-04 NOTE — Telephone Encounter (Signed)
This patient did not show for a new patient appointment today. They called within one hour of the appointment indicating that were stuck in traffic.

## 2013-12-05 ENCOUNTER — Ambulatory Visit: Payer: Medicare Other | Admitting: Cardiovascular Disease

## 2013-12-06 ENCOUNTER — Ambulatory Visit: Payer: Medicare Other | Admitting: Neurology

## 2013-12-21 ENCOUNTER — Ambulatory Visit: Payer: Medicare Other | Admitting: Neurology

## 2014-01-04 ENCOUNTER — Ambulatory Visit (INDEPENDENT_AMBULATORY_CARE_PROVIDER_SITE_OTHER): Payer: Medicare Other | Admitting: Neurology

## 2014-01-04 ENCOUNTER — Encounter: Payer: Self-pay | Admitting: Neurology

## 2014-01-04 VITALS — BP 134/68 | HR 82 | Temp 98.4°F | Ht 65.5 in | Wt 202.6 lb

## 2014-01-04 DIAGNOSIS — F0390 Unspecified dementia without behavioral disturbance: Secondary | ICD-10-CM

## 2014-01-04 DIAGNOSIS — I2510 Atherosclerotic heart disease of native coronary artery without angina pectoris: Secondary | ICD-10-CM

## 2014-01-04 NOTE — Progress Notes (Signed)
NEUROLOGY CONSULTATION NOTE  Rachel Conner MRN: [id number] DOB: Aug 09, 1935  Referring provider: Dr. Johnsie Cancel  Reason for consult:  Vascular dementia  HISTORY OF PRESENT ILLNESS: Rachel Conner is a 78 year old right-handed woman with history of coronary artery disease with bypass surgery and stenting, DVT (recently stopped Xarelto), hypertension, and hypercholesterolemia who presents for dementia.  She is accompanied by her friend. Limited records reviewed.  In October or November 2014, patient got out of bed and fell backwards hitting her head. There was loss of consciousness. She was brought to an outside emergency room where imaging revealed a hematoma. I am not sure if this was a subdural hematoma or a subcutaneous hematoma. For the next 2 months, she exhibited altered mental status. She would experience delusions. For example, she would be sitting in the living room and thinking she was climbing a mountain with her husband. She would also began talking Gaelic to other people. She has no recollection of this. During the holidays. In January, she was eventually admitted to [facility] for a reported diagnosis of major depressive disorder. She reportedly was evaluated by a neurologist. She had a CT of the brain which revealed chronic ischemic changes but no acute intracranial abnormalities. TSH was 2.53, hemoglobin A1c was 5.8, B12 was 435, vitamin D was 52.5, INR 1, folate 13.1. UA in U. tox were negative. She was diagnosed with vascular dementia. During her hospitalization, she became more lucid. According to her friend, she is back at baseline and has been since 08/13/2022. Her husband passed away in 12-Oct-2022. She is now living by herself in her house. She is able to perform all activities of daily living. She cooks, cleans, dresses and bathes herself. She handles all her finances. She still drives without any problems. Her friend says that she feels safe in the car when the patient  is driving. She only drives in the country in an 18 mile radius.  She currently takes B12 500 mcg daily, D3 10,000 units daily and vitamin E 400 units daily.  She also takes levothyroxine. She denies any prior history of stroke.  11/07/13 LABS:  LDL 81 09/11/12 LABS:  Hgb A1c 6.4, mean plasma glucose 137  PAST MEDICAL HISTORY: Past Medical History  Diagnosis Date  . Arthritis   . CAD (coronary artery disease) 1999    CABG  . Diabetes mellitus   . HLD (hyperlipidemia)   . HTN (hypertension)   . Hypothyroidism   . Colon polyps   . Hiatal hernia   . Peripheral neuropathy   . Anxiety   . Hemorrhoids   . Major depressive disorder   . MI (myocardial infarction)   . DVT (deep venous thrombosis)     PAST SURGICAL HISTORY: Past Surgical History  Procedure Laterality Date  . Coronary artery bypass graft  1997-10-11  . Carotid stent  07/1998    stent RCA   . Coronary stent placement  October 11, 2001    circumflex stent  . Back surgery    . Thyroid surgery    . Cholecystectomy      MEDICATIONS: Current Outpatient Prescriptions on File Prior to Visit  Medication Sig Dispense Refill  . amLODipine (NORVASC) 10 MG tablet Take 1 tablet (10 mg total) by mouth daily.  90 tablet  1  . aspirin EC 81 MG tablet Take 81-162 mg by mouth daily as needed for mild pain.      . Cholecalciferol (VITAMIN D-3) 5000 UNITS TABS Take 10,000 Units by  mouth.      . citalopram (CELEXA) 10 MG tablet Take 10 mg by mouth every evening.      . fenofibrate (TRICOR) 145 MG tablet Take 145 mg by mouth daily.        . hydrOXYzine (ATARAX/VISTARIL) 10 MG tablet Take 10 mg by mouth daily as needed for anxiety.      Marland Kitchen levothyroxine (SYNTHROID, LEVOTHROID) 100 MCG tablet Take 100 mcg by mouth daily.        Marland Kitchen losartan-hydrochlorothiazide (HYZAAR) 50-12.5 MG per tablet Take 1 tablet by mouth daily.  90 tablet  3  . Melatonin 1 MG TABS Take 1 mg by mouth at bedtime.      . rosuvastatin (CRESTOR) 20 MG tablet Take 20 mg by mouth daily.         . traZODone (DESYREL) 50 MG tablet Take 50 mg by mouth at bedtime.      . vitamin B-12 (CYANOCOBALAMIN) 500 MCG tablet Take 500 mcg by mouth daily.      . vitamin E 400 UNIT capsule Take 400 Units by mouth every morning.       Marland Kitchen omega-3 acid ethyl esters (LOVAZA) 1 G capsule Take 1 g by mouth every morning.      . pantoprazole (PROTONIX) 40 MG tablet Take 40 mg by mouth daily.      . Rivaroxaban (XARELTO) 15 MG TABS tablet Take 1 tablet (15 mg total) by mouth daily with supper.  90 tablet  3  . solifenacin (VESICARE) 10 MG tablet Take by mouth daily.       No current facility-administered medications on file prior to visit.    ALLERGIES: Allergies  Allergen Reactions  . Myrbetriq [Mirabegron]     Gross hematuria  . Niaspan [Niacin Er]     Severe flushing  . Nitroglycerin Other (See Comments)    Blood pressure drops down to low    FAMILY HISTORY: Family History  Problem Relation Age of Onset  . Heart disease Father   . Heart disease Mother   . Heart disease Brother   . Heart disease Other     grandfather (side unknown)  . Colon cancer Neg Hx   . Cancer Sister     lumphoma    SOCIAL HISTORY: History   Social History  . Marital Status: Married    Spouse Name: N/A    Number of Children: 3  . Years of Education: N/A   Occupational History  . retired    Social History Main Topics  . Smoking status: Former Research scientist (life sciences)  . Smokeless tobacco: Never Used  . Alcohol Use: No  . Drug Use: No  . Sexual Activity: Not on file   Other Topics Concern  . Not on file   Social History Narrative  . No narrative on file    REVIEW OF SYSTEMS: Constitutional: No fevers, chills, or sweats, no generalized fatigue, change in appetite Eyes: No visual changes, double vision, eye pain Ear, nose and throat: No hearing loss, ear pain, nasal congestion, sore throat Cardiovascular: No chest pain, palpitations Respiratory:  No shortness of breath at rest or with exertion,  wheezes GastrointestinaI: No nausea, vomiting, diarrhea, abdominal pain, fecal incontinence Genitourinary:  No dysuria, urinary retention or frequency Musculoskeletal:  No neck pain, back pain Integumentary: No rash, pruritus, skin lesions Neurological: as above Psychiatric: No depression, insomnia, anxiety Endocrine: No palpitations, fatigue, diaphoresis, mood swings, change in appetite, change in weight, increased thirst Hematologic/Lymphatic:  No anemia, purpura, petechiae. Allergic/Immunologic:  no itchy/runny eyes, nasal congestion, recent allergic reactions, rashes  PHYSICAL EXAM: Filed Vitals:   01/04/14 1329  BP: 134/68  Pulse: 82  Temp: 98.4 F (36.9 C)   General: No acute distress Head:  Normocephalic/atraumatic Neck: supple, no paraspinal tenderness, full range of motion Back: No paraspinal tenderness Heart: regular rate and rhythm Lungs: Clear to auscultation bilaterally. Vascular: No carotid bruits. Neurological Exam: Mental status: alert and oriented to person, place, and time, delayed recall impaired (recalled 1 of 5 words), remote memory intact, fund of knowledge intact, attention and concentration intact, serial 7 subtraction intact, speech fluent and not dysarthric, naming and repetition intact, Trail making test correct, able to draw a clock, difficulty copying a cube, mild impairment in abstract thinking.  MoCA 22/30 Cranial nerves: CN I: not tested CN II: pupils equal, round and reactive to light, visual fields intact, fundi unremarkable, without vessel changes, exudates, hemorrhages or papilledema. CN III, IV, VI:  full range of motion, no nystagmus, no ptosis CN V: facial sensation intact CN VII: upper and lower face symmetric CN VIII: hearing intact CN IX, X: gag intact, uvula midline CN XI: sternocleidomastoid and trapezius muscles intact CN XII: tongue midline Bulk & Tone: normal, no fasciculations. Motor: 5 out of 5 throughout Sensation: Temperature  and vibration intact Deep Tendon Reflexes: 2+ throughout, except absent in the ankles. Toes downgoing. Finger to nose testing: No dysmetria. Heel to shin: No dysmetria. Gait: Normal station and stride. Some difficulty with tandem walking. Romberg negative.  IMPRESSION: Dementia.  I have some lab work and CT report but I do not have the full notes from her prior hospitalization.  She does have cognitive impairment, as demonstrated by deficits in both memory as well as visuospatial functioning,  PLAN: 1.  MRI Brain 2.  Consider neuropsych testing pending MRI results 3.  Consider starting Aricept.  Will wait for MRI results 4.  Follow up in 6 months.  45 minutes spent with patient, over 50% spent counseling and coordinating care.  Thank you for allowing me to take part in the care of this patient.  Metta Clines, DO  CC:  Jenkins Rouge, MD

## 2014-01-04 NOTE — Patient Instructions (Signed)
1.  We will get MRI of the brain. 2.  Depending on the results, we may send you for neuropsychological testing or start a medication such as Aricept. 3.  Follow up in 6 months or as needed.

## 2014-02-12 ENCOUNTER — Encounter: Payer: Self-pay | Admitting: Cardiovascular Disease

## 2014-02-12 ENCOUNTER — Ambulatory Visit (INDEPENDENT_AMBULATORY_CARE_PROVIDER_SITE_OTHER): Payer: Medicare Other | Admitting: Cardiovascular Disease

## 2014-02-12 VITALS — BP 130/64 | HR 80 | Ht 65.0 in | Wt 202.4 lb

## 2014-02-12 DIAGNOSIS — F0150 Vascular dementia without behavioral disturbance: Secondary | ICD-10-CM

## 2014-02-12 DIAGNOSIS — Z7901 Long term (current) use of anticoagulants: Secondary | ICD-10-CM

## 2014-02-12 DIAGNOSIS — I119 Hypertensive heart disease without heart failure: Secondary | ICD-10-CM

## 2014-02-12 DIAGNOSIS — F0151 Vascular dementia with behavioral disturbance: Secondary | ICD-10-CM

## 2014-02-12 DIAGNOSIS — I209 Angina pectoris, unspecified: Secondary | ICD-10-CM

## 2014-02-12 DIAGNOSIS — I25119 Atherosclerotic heart disease of native coronary artery with unspecified angina pectoris: Secondary | ICD-10-CM

## 2014-02-12 DIAGNOSIS — I2510 Atherosclerotic heart disease of native coronary artery without angina pectoris: Secondary | ICD-10-CM

## 2014-02-12 NOTE — Assessment & Plan Note (Signed)
Seems improved  F/U Dr Tomi Likens [facility]

## 2014-02-12 NOTE — Assessment & Plan Note (Signed)
Stable with no angina and good activity level.  Continue medical Rx  

## 2014-02-12 NOTE — Progress Notes (Signed)
Patient ID: Rachel Conner, female   DOB: 1936/02/17, 78 y.o.   MRN: [id number] Rachel Conner is seen today in followup for history of coronary artery disease and bypass surgery. I believe her bypass was in 99. He subsequently had a stenting of the native RCA she has an occluded vein graft to the right. She also has a stent in the native obtuse marginal branch. Her last catheter was in 2003. Her last Myoview in 2008 was nonischemic.her ejection fraction 64% at that time. She is not having t any significant chest pain. She has had a persistant dry cough with lisinipril Changed to cozaar in February She continues to need to work on her diet and carb intake. He BS has been ok as has her cholesterol as checked by her primary She had succesful gallbladder surgery in March. She had her F/U colonoscopy and there were no issues. In February has extensive LLE DVT F/U duplex in August showed mild residual thrombus in left external iliac. Compliant with coumadin Being checked in [place] and has been too low Repeat duplex here today still shows large clot burden in left femoral and iliac with limited phasicity. Discussed changing to Xarelto 15 mg for DVT since it will provide more consistant anticoagulaiton   Having more memory problems Seen at [facility] recently ? Vascular dementia HR low and lopressor stopped Husband died in 04-24-23  BP not adequately controlled  Last visit added Hyzaar and amlodipine      ROS: Denies fever, malais, weight loss, blurry vision, decreased visual acuity, cough, sputum, SOB, hemoptysis, pleuritic pain, palpitaitons, heartburn, abdominal pain, melena, lower extremity edema, claudication, or rash.  All other systems reviewed and negative  General: Affect appropriate Healthy:  appears stated age 78: normal Neck supple with no adenopathy JVP normal no bruits no thyromegaly Lungs clear with no wheezing and good diaphragmatic motion Heart:  S1/S2 no murmur, no rub, gallop or click PMI  normal Abdomen: benighn, BS positve, no tenderness, no AAA no bruit.  No HSM or HJR Distal pulses intact with no bruits No edema Neuro non-focal Skin warm and dry No muscular weakness   Current Outpatient Prescriptions  Medication Sig Dispense Refill  . amLODipine (NORVASC) 10 MG tablet Take 1 tablet (10 mg total) by mouth daily.  90 tablet  1  . aspirin EC 81 MG tablet Take 81-162 mg by mouth daily as needed for mild pain.      . Cholecalciferol (VITAMIN D-3) 5000 UNITS TABS Take 10,000 Units by mouth.      . citalopram (CELEXA) 10 MG tablet Take 10 mg by mouth every evening.      Marland Kitchen esomeprazole (NEXIUM) 20 MG capsule Take 20 mg by mouth daily at 12 noon.      . fenofibrate (TRICOR) 145 MG tablet Take 145 mg by mouth daily.        . hydrOXYzine (ATARAX/VISTARIL) 10 MG tablet Take 10 mg by mouth daily as needed for anxiety.      Marland Kitchen levothyroxine (SYNTHROID, LEVOTHROID) 100 MCG tablet Take 100 mcg by mouth daily.        Marland Kitchen losartan-hydrochlorothiazide (HYZAAR) 50-12.5 MG per tablet Take 1 tablet by mouth daily.  90 tablet  3  . Melatonin 1 MG TABS Take 1 mg by mouth at bedtime.      Marland Kitchen omega-3 acid ethyl esters (LOVAZA) 1 G capsule Take 1 g by mouth every morning.      . rosuvastatin (CRESTOR) 20 MG tablet Take 20 mg by  mouth daily.        . traZODone (DESYREL) 50 MG tablet Take 50 mg by mouth at bedtime.      . vitamin B-12 (CYANOCOBALAMIN) 500 MCG tablet Take 500 mcg by mouth daily.      . vitamin C (ASCORBIC ACID) 500 MG tablet Take 500 mg by mouth daily.      . vitamin E 400 UNIT capsule Take 400 Units by mouth every morning.        No current facility-administered medications for this visit.    Allergies  Myrbetriq; Niaspan; and Nitroglycerin  Electrocardiogram:  SR rate 55 nonspecific ST changes   Assessment and Plan

## 2014-02-12 NOTE — Patient Instructions (Signed)
Your physician wants you to follow-up in:  6 MONTHS WITH DR NISHAN  You will receive a reminder letter in the mail two months in advance. If you don't receive a letter, please call our office to schedule the follow-up appointment. Your physician recommends that you continue on your current medications as directed. Please refer to the Current Medication list given to you today. 

## 2014-02-12 NOTE — Assessment & Plan Note (Signed)
Rx no bleeding issues  F/u clinic

## 2014-02-12 NOTE — Assessment & Plan Note (Signed)
Improved with diuretic  And amlodipine continue current regimen

## 2014-03-12 ENCOUNTER — Telehealth: Payer: Self-pay | Admitting: Neurology

## 2014-03-12 ENCOUNTER — Other Ambulatory Visit: Payer: Self-pay | Admitting: *Deleted

## 2014-03-12 DIAGNOSIS — R413 Other amnesia: Secondary | ICD-10-CM

## 2014-03-12 DIAGNOSIS — R55 Syncope and collapse: Secondary | ICD-10-CM

## 2014-03-12 NOTE — Telephone Encounter (Signed)
Patient MRI is 03/20/14 [place] at [facility]

## 2014-03-12 NOTE — Telephone Encounter (Signed)
This is a Therapist, occupational patient.

## 2014-03-12 NOTE — Telephone Encounter (Signed)
Patient states her new telephone number is [phone]

## 2014-03-12 NOTE — Telephone Encounter (Signed)
Pt called wanting to f/u on the MRI she thought Caryl Pina was going to set up for her last time she was in. C/B [phone]

## 2014-03-14 ENCOUNTER — Encounter: Payer: Self-pay | Admitting: Internal Medicine

## 2014-03-20 ENCOUNTER — Ambulatory Visit
Admission: RE | Admit: 2014-03-20 | Discharge: 2014-03-20 | Disposition: A | Discharge status: Home / Self Care | Payer: Medicare Other | Source: Ambulatory Visit | Attending: Neurology | Admitting: Neurology

## 2014-03-20 DIAGNOSIS — R413 Other amnesia: Secondary | ICD-10-CM

## 2014-03-20 DIAGNOSIS — R55 Syncope and collapse: Secondary | ICD-10-CM

## 2014-03-21 ENCOUNTER — Other Ambulatory Visit: Payer: Self-pay | Admitting: *Deleted

## 2014-03-21 ENCOUNTER — Telehealth: Payer: Self-pay | Admitting: Neurology

## 2014-03-21 DIAGNOSIS — F0391 Unspecified dementia with behavioral disturbance: Secondary | ICD-10-CM

## 2014-03-21 DIAGNOSIS — F03918 Unspecified dementia, unspecified severity, with other behavioral disturbance: Secondary | ICD-10-CM

## 2014-03-21 NOTE — Telephone Encounter (Signed)
Discussed results of MRI.  Will refer her to Dr. Valentina Shaggy for neuropsych testing.

## 2014-04-22 ENCOUNTER — Other Ambulatory Visit: Payer: Self-pay | Admitting: Cardiovascular Disease

## 2014-05-16 ENCOUNTER — Ambulatory Visit: Payer: Medicare Other | Admitting: Internal Medicine

## 2014-05-17 ENCOUNTER — Telehealth: Payer: Self-pay

## 2014-05-17 NOTE — Telephone Encounter (Signed)
Letter mailed to patient to call us to get back on the books to see Dr Carlean Purl.  Tried to call her and says # no longer in service.

## 2014-06-09 ENCOUNTER — Other Ambulatory Visit: Payer: Self-pay | Admitting: Cardiovascular Disease

## 2014-07-09 ENCOUNTER — Ambulatory Visit: Payer: Medicare Other | Admitting: Neurology

## 2014-07-10 ENCOUNTER — Encounter: Payer: Self-pay | Admitting: *Deleted

## 2014-07-10 ENCOUNTER — Telehealth: Payer: Self-pay | Admitting: Neurology

## 2014-07-10 NOTE — Telephone Encounter (Signed)
Pt no showed appt w/ Dr. Tomi Likens 07/09/14.   Rachel Conner - please send no show letter / Gayleen Orem

## 2014-07-10 NOTE — Progress Notes (Signed)
No show letter sent for 07/09/2014

## 2014-08-09 ENCOUNTER — Encounter: Payer: Self-pay | Admitting: Cardiovascular Disease

## 2014-08-30 ENCOUNTER — Encounter: Payer: Self-pay | Admitting: Cardiovascular Disease

## 2014-08-30 ENCOUNTER — Ambulatory Visit (INDEPENDENT_AMBULATORY_CARE_PROVIDER_SITE_OTHER): Payer: Medicare Other | Admitting: Cardiovascular Disease

## 2014-08-30 VITALS — BP 118/62 | HR 82 | Ht 65.0 in | Wt 213.1 lb

## 2014-08-30 DIAGNOSIS — E7800 Pure hypercholesterolemia, unspecified: Secondary | ICD-10-CM

## 2014-08-30 DIAGNOSIS — R079 Chest pain, unspecified: Secondary | ICD-10-CM

## 2014-08-30 DIAGNOSIS — Z01818 Encounter for other preprocedural examination: Secondary | ICD-10-CM

## 2014-08-30 DIAGNOSIS — Z7189 Other specified counseling: Secondary | ICD-10-CM | POA: Insufficient documentation

## 2014-08-30 DIAGNOSIS — I119 Hypertensive heart disease without heart failure: Secondary | ICD-10-CM

## 2014-08-30 DIAGNOSIS — Z01810 Encounter for preprocedural cardiovascular examination: Secondary | ICD-10-CM

## 2014-08-30 DIAGNOSIS — I2510 Atherosclerotic heart disease of native coronary artery without angina pectoris: Secondary | ICD-10-CM

## 2014-08-30 NOTE — Assessment & Plan Note (Signed)
Off xarelto samples given to take if she goes on long flight back to [place]

## 2014-08-30 NOTE — Patient Instructions (Addendum)
Your physician wants you to follow-up in:   6 MONTHS WITH DR NISHAN' You will receive a reminder letter in the mail two months in advance. If you don't receive a letter, please call our office to schedule the follow-up appointment. Your physician recommends that you continue on your current medications as directed. Please refer to the Current Medication list given to you today.   Your physician has requested that you have a lexiscan myoview. For further information please visit [email or web address]. Please follow instruction sheet, as given.  

## 2014-08-30 NOTE — Progress Notes (Signed)
Patient ID: [name], female   DOB: Jan 05, 1936, 79 y.o.   MRN: [id number] Rachel Conner is seen today in followup for history of coronary artery disease and bypass surgery. I believe her bypass was in 99. He subsequently had a stenting of the native RCA she has an occluded vein graft to the right. She also has a stent in the native obtuse marginal branch. Her last catheter was in 2003. Her last Myoview in 2008 was nonischemic.her ejection fraction 64% at that time. She is not having t any significant chest pain. She has had a persistant dry cough with lisinipril Changed to cozaar in February She continues to need to work on her diet and carb intake. He BS has been ok as has her cholesterol as checked by her primary She had succesful gallbladder surgery in March. She had her F/U colonoscopy and there were no issues. In February has extensive LLE DVT F/U duplex in August showed mild residual thrombus in left external iliac. Compliant with coumadin Being checked in [place] and has been too low Repeat duplex here today still shows large clot burden in left femoral and iliac with limited phasicity. Anticoagulation changed to xarelto 2015   Having more memory problems Se[facility] recently ? Vascular dementia HR low and lopressor stopped Husband died in 11-26-2022 BP improved adding ACE and calcium blocker   Hurt right knee meniscal tear and may need surgery  Some SSCP not taking nitro Needs myovue to clear for surgery       ROS: Denies fever, malais, weight loss, blurry vision, decreased visual acuity, cough, sputum, SOB, hemoptysis, pleuritic pain, palpitaitons, heartburn, abdominal pain, melena, lower extremity edema, claudication, or rash.  All other systems reviewed and negative  General: Affect appropriate Healthy:  appears stated age 79: normal Neck supple with no adenopathy JVP normal no bruits no thyromegaly Lungs clear with no wheezing and good diaphragmatic motion Heart:  S1/S2 no murmur, no  rub, gallop or click PMI normal Abdomen: benighn, BS positve, no tenderness, no AAA no bruit.  No HSM or HJR Distal pulses intact with no bruits No edema Neuro non-focal Skin warm and dry No muscular weakness   Current Outpatient Prescriptions  Medication Sig Dispense Refill  . amLODipine (NORVASC) 10 MG tablet TAKE 1 TABLET DAILY 90 tablet 1  . aspirin 325 MG tablet Take 325 mg by mouth 2 (two) times daily.    . Cholecalciferol (VITAMIN D-3) 5000 UNITS TABS Take 10,000 Units by mouth.    . citalopram (CELEXA) 10 MG tablet Take 10 mg by mouth every evening.    Marland Kitchen esomeprazole (NEXIUM) 20 MG capsule Take 20 mg by mouth daily at 12 noon.    . fenofibrate (TRICOR) 145 MG tablet Take 145 mg by mouth daily.      . hydrOXYzine (ATARAX/VISTARIL) 10 MG tablet Take 10 mg by mouth daily as needed for anxiety.    Marland Kitchen levothyroxine (SYNTHROID, LEVOTHROID) 100 MCG tablet Take 100 mcg by mouth daily.      Marland Kitchen losartan-hydrochlorothiazide (HYZAAR) 50-12.5 MG per tablet Take 1 tablet by mouth daily. 90 tablet 3  . Melatonin 1 MG TABS Take 1 mg by mouth at bedtime.    Marland Kitchen omega-3 acid ethyl esters (LOVAZA) 1 G capsule Take 1 g by mouth every morning.    . rosuvastatin (CRESTOR) 20 MG tablet Take 20 mg by mouth daily.      . traZODone (DESYREL) 50 MG tablet Take 50 mg by mouth at bedtime.    Marland Kitchen  vitamin B-12 (CYANOCOBALAMIN) 500 MCG tablet Take 500 mcg by mouth daily.    . vitamin C (ASCORBIC ACID) 500 MG tablet Take 500 mg by mouth daily.    . vitamin E 400 UNIT capsule Take 400 Units by mouth every morning.      No current facility-administered medications for this visit.    Allergies  Myrbetriq; Niaspan; and Nitroglycerin  Electrocardiogram:  SR rate 55 nonspecific ST changes  02/12/14   SR rate 82  Nonspecific ST changes   Assessment and Plan

## 2014-08-30 NOTE — Assessment & Plan Note (Signed)
Well controlled.  Continue current medications and low sodium Dash type diet.    

## 2014-08-30 NOTE — Assessment & Plan Note (Signed)
Extensive CAD with known occluded grafts and two native stents  F/u Lexiscan myovue

## 2014-08-30 NOTE — Assessment & Plan Note (Signed)
Cholesterol is at goal.  Continue current dose of statin and diet Rx.  No myalgias or side effects.  F/U  LFT's in 6 months. Lab Results  Component Value Date   LDLCALC 81 11/07/2013

## 2014-09-06 ENCOUNTER — Encounter: Payer: Self-pay | Admitting: Cardiovascular Disease

## 2014-09-06 ENCOUNTER — Encounter: Payer: Self-pay | Admitting: Cardiology

## 2014-09-11 ENCOUNTER — Ambulatory Visit (HOSPITAL_COMMUNITY): Payer: Medicare Other | Attending: Cardiovascular Disease | Admitting: Radiology

## 2014-09-11 ENCOUNTER — Other Ambulatory Visit (HOSPITAL_COMMUNITY): Payer: Self-pay | Admitting: *Deleted

## 2014-09-11 DIAGNOSIS — Z951 Presence of aortocoronary bypass graft: Secondary | ICD-10-CM | POA: Diagnosis not present

## 2014-09-11 DIAGNOSIS — R079 Chest pain, unspecified: Secondary | ICD-10-CM | POA: Insufficient documentation

## 2014-09-11 DIAGNOSIS — R55 Syncope and collapse: Secondary | ICD-10-CM | POA: Insufficient documentation

## 2014-09-11 DIAGNOSIS — I10 Essential (primary) hypertension: Secondary | ICD-10-CM | POA: Diagnosis not present

## 2014-09-11 DIAGNOSIS — I2510 Atherosclerotic heart disease of native coronary artery without angina pectoris: Secondary | ICD-10-CM | POA: Diagnosis not present

## 2014-09-11 DIAGNOSIS — Z01818 Encounter for other preprocedural examination: Secondary | ICD-10-CM

## 2014-09-11 MED ORDER — TECHNETIUM TC 99M SESTAMIBI GENERIC - CARDIOLITE
30.0000 | Freq: Once | INTRAVENOUS | Status: AC | PRN
  Administered 2014-09-11: 30 via INTRAVENOUS

## 2014-09-11 MED ORDER — REGADENOSON 0.4 MG/5ML IV SOLN
0.4000 mg | Freq: Once | INTRAVENOUS | Status: AC
  Administered 2014-09-11: 0.4 mg via INTRAVENOUS

## 2014-09-11 MED ORDER — TECHNETIUM TC 99M SESTAMIBI GENERIC - CARDIOLITE
10.0000 | Freq: Once | INTRAVENOUS | Status: AC | PRN
  Administered 2014-09-11: 10 via INTRAVENOUS

## 2014-09-11 NOTE — Progress Notes (Signed)
[facility] [place], [place] [address] [phone]    Cardiology Nuclear Med Study  Rachel Conner is a 79 y.o. female     MRN : [id number]     DOB: Apr 29, 1936  Procedure Date: 09/11/2014  Nuclear Med Background Indication for Stress Test:  Evaluation for Ischemia History:  CAD-CABG/Stents MPI 2008 EF: 64% NL Cardiac Risk Factors: Hypertension  Symptoms:  Chest Pain and Syncope   Nuclear Pre-Procedure Caffeine/Decaff Intake:  None NPO After: 8:00pm   Lungs:  clear O2 Sat: 98% on room air. IV 0.9% NS with Angio Cath:  22g  IV Site: R Hand  IV Started by:  Crissie Figures, RN  Chest Size (in):  46 Cup Size: C  Height: 5\' 5"  (1.651 m)  Weight:  210 lb (95.255 kg)  BMI:  Body mass index is 34.95 kg/(m^2). Tech Comments:  N/A    Nuclear Med Study 1 or 2 day study: 1 day  Stress Test Type:  Lexiscan  Reading MD: N/A  Order Authorizing Provider:  Jenkins Rouge, MD  Resting Radionuclide: Technetium 23m Sestamibi  Resting Radionuclide Dose: 11.0 mCi   Stress Radionuclide:  Technetium 42m Sestamibi  Stress Radionuclide Dose: 33.0 mCi           Stress Protocol Rest HR: 67 Stress HR: 85  Rest BP: 125/56 Stress BP: 134/63  Exercise Time (min): n/a METS: n/a   Predicted Max HR: 144 bpm % Max HR: 59.03 bpm Rate Pressure Product: 11390   Dose of Adenosine (mg):  n/a Dose of Lexiscan: 0.4 mg  Dose of Atropine (mg): n/a Dose of Dobutamine: n/a mcg/kg/min (at max HR)  Stress Test Technologist: Perrin Maltese, EMT-P  Nuclear Technologist:  Earl Many, CNMT     Rest Procedure:  Myocardial perfusion imaging was performed at rest 45 minutes following the intravenous administration of Technetium 19m Sestamibi. Rest ECG: Normal sinus rhythm. Decreased anterior R-wave progression.  Stress Procedure:  The patient received IV Lexiscan 0.4 mg over 15-seconds.  Technetium 66m Sestamibi injected at 30-seconds. This patient had sob with the  Lexiscan injection. Quantitative spect images were obtained after a 45 minute delay. Stress ECG: No significant change from baseline ECG  QPS Raw Data Images:  Normal; no motion artifact; normal heart/lung ratio. Stress Images:  Normal homogeneous uptake in all areas of the myocardium. Rest Images:  Normal homogeneous uptake in all areas of the myocardium. Subtraction (SDS):  No evidence of ischemia. Transient Ischemic Dilatation (Normal <1.22):  0.83 Lung/Heart Ratio (Normal <0.45):  0.38  Quantitative Gated Spect Images QGS EDV:  79 ml QGS ESV:  27 ml  Impression Exercise Capacity:  Lexiscan with no exercise. BP Response:  Normal blood pressure response. Clinical Symptoms:  Shortness of breath ECG Impression:  No significant ST segment change suggestive of ischemia. Comparison with Prior Nuclear Study: This study is compared with the report of the study from September, 2008.  Overall Impression:  Normal stress nuclear study. There is no scar or ischemia. This is a low risk scan. There is no significant change since the report of the study from September, 2008.  LV Ejection Fraction: 66%.  LV Wall Motion:  Normal Wall Motion   Daryel November, MD

## 2014-09-12 ENCOUNTER — Other Ambulatory Visit: Payer: Self-pay | Admitting: Cardiovascular Disease

## 2014-10-16 ENCOUNTER — Other Ambulatory Visit: Payer: Self-pay | Admitting: Cardiovascular Disease

## 2014-12-02 NOTE — Progress Notes (Signed)
Patient ID: Rachel Conner, female   DOB: 1935-08-06, 79 y.o.   MRN: [id number] Rachel Conner is seen today in followup for history of coronary artery disease and bypass surgery. I believe her bypass was in 99. He subsequently had a stenting of the native RCA she has an occluded vein graft to the right. She also has a stent in the native obtuse marginal branch. Her last catheter was in 2003.  Myoview in 2008 was nonischemic.her ejection fraction 64% at that time. She is not having t any significant chest pain. She has had a persistant dry cough with lisinipril Changed to cozaar in February She continues to need to work on her diet and carb intake. He BS has been ok as has her cholesterol as checked by her primary She had succesful gallbladder surgery in March. She had her F/U colonoscopy and there were no issues. In February has extensive LLE DVT F/U duplex in August showed mild residual thrombus in left external iliac. Compliant with coumadin Being checked in [place] and has been too low Repeat duplex here today still shows large clot burden in left femoral and iliac with limited phasicity. Anticoagulation changed to xarelto 2015   Having more memory problems Seen at [facility] recently ? Vascular dementia HR low and lopressor stopped Husband died in 03-Dec-2022 BP improved adding ACE and calcium blocker   Hurt right knee meniscal tear and may need surgery  Some SSCP pre op myovue done 09/11/14  Reviewed No ischemia or infarct EF 66%   Still very depressed  ROS: Denies fever, malais, weight loss, blurry vision, decreased visual acuity, cough, sputum, SOB, hemoptysis, pleuritic pain, palpitaitons, heartburn, abdominal pain, melena, lower extremity edema, claudication, or rash.  All other systems reviewed and negative  General: Affect appropriate Healthy:  appears stated age 89: normal Neck supple with no adenopathy JVP normal no bruits no thyromegaly Lungs clear with no wheezing and good diaphragmatic  motion Heart:  S1/S2 no murmur, no rub, gallop or click PMI normal Abdomen: benighn, BS positve, no tenderness, no AAA no bruit.  No HSM or HJR Distal pulses intact with no bruits No edema Neuro non-focal Skin warm and dry No muscular weakness   Current Outpatient Prescriptions  Medication Sig Dispense Refill  . ALPRAZolam (XANAX) 0.25 MG tablet Take 0.25 mg by mouth 2 (two) times daily.    Marland Kitchen amLODipine (NORVASC) 10 MG tablet TAKE 1 TABLET DAILY 90 tablet 1  . aspirin 325 MG tablet Take 325 mg by mouth 2 (two) times daily.    . Cholecalciferol (VITAMIN D-3) 5000 UNITS TABS Take 10,000 Units by mouth.    . citalopram (CELEXA) 10 MG tablet Take 10 mg by mouth every evening.    Marland Kitchen esomeprazole (NEXIUM) 20 MG capsule Take 20 mg by mouth daily at 12 noon.    . fenofibrate (TRICOR) 145 MG tablet Take 145 mg by mouth daily.      . hydrOXYzine (ATARAX/VISTARIL) 10 MG tablet Take 10 mg by mouth daily as needed for anxiety.    Marland Kitchen levothyroxine (SYNTHROID, LEVOTHROID) 100 MCG tablet Take 100 mcg by mouth daily.      Marland Kitchen losartan-hydrochlorothiazide (HYZAAR) 50-12.5 MG per tablet TAKE 1 TABLET DAILY 90 tablet 1  . Melatonin 1 MG TABS Take 1 mg by mouth at bedtime.    Marland Kitchen omega-3 acid ethyl esters (LOVAZA) 1 G capsule Take 1 g by mouth every morning.    . Omega-3 Fatty Acids (FISH OIL) 500 MG CAPS Take 1 tablet  by mouth daily.    . rivaroxaban (XARELTO) 20 MG TABS tablet Take 20 mg by mouth daily.    . rosuvastatin (CRESTOR) 20 MG tablet Take 20 mg by mouth daily.      . traZODone (DESYREL) 50 MG tablet Take 50 mg by mouth at bedtime.    . vitamin B-12 (CYANOCOBALAMIN) 500 MCG tablet Take 500 mcg by mouth daily.    . vitamin C (ASCORBIC ACID) 500 MG tablet Take 500 mg by mouth daily.    . vitamin E 400 UNIT capsule Take 400 Units by mouth every morning.      No current facility-administered medications for this visit.    Allergies  Myrbetriq; Niaspan; and Nitroglycerin  Electrocardiogram:  SR  rate 55 nonspecific ST changes  02/12/14    09/11/14  SR rate 82  Nonspecific ST changes   Assessment and Plan CAD:  Stable with no angina and good activity level.  Continue medical Rx HTN: Well controlled.  Continue current medications and low sodium Dash type diet.   Chol:  Labs from [place] reviewed and LDL 78 normal LFTls Cholesterol is at goal.  Continue current dose of statin and diet Rx.  No myalgias or side effects.  F/U  LFT's in 6 months. Lab Results  Component Value Date   LDLCALC 81 11/07/2013  Depression: major issue not taking Celexa  F/u primary consdier Pristiq  F/u with me in 6 months

## 2014-12-04 ENCOUNTER — Ambulatory Visit (INDEPENDENT_AMBULATORY_CARE_PROVIDER_SITE_OTHER): Payer: Medicare Other | Admitting: Cardiovascular Disease

## 2014-12-04 ENCOUNTER — Encounter: Payer: Self-pay | Admitting: Cardiovascular Disease

## 2014-12-04 VITALS — BP 110/58 | HR 98 | Ht 65.0 in | Wt 215.0 lb

## 2014-12-04 DIAGNOSIS — I2510 Atherosclerotic heart disease of native coronary artery without angina pectoris: Secondary | ICD-10-CM | POA: Diagnosis not present

## 2014-12-04 DIAGNOSIS — I2583 Coronary atherosclerosis due to lipid rich plaque: Principal | ICD-10-CM

## 2014-12-04 NOTE — Patient Instructions (Signed)
Medication Instructions:  NO CHANGES OK  TO TRY PRISTIK  Labwork: NONE  Testing/Procedures: NONE  Follow-Up: Your physician wants you to follow-up in: [place] will receive a reminder letter in the mail two months in advance. If you don't receive a letter, please call our office to schedule the follow-up appointment.  Any Other Special Instructions Will Be Listed Below (If Applicable).

## 2015-01-06 ENCOUNTER — Other Ambulatory Visit: Payer: Self-pay

## 2015-01-08 ENCOUNTER — Other Ambulatory Visit: Payer: Self-pay | Admitting: Cardiovascular Disease

## 2015-01-08 ENCOUNTER — Telehealth: Payer: Self-pay | Admitting: Cardiovascular Disease

## 2015-01-08 DIAGNOSIS — H44811 Hemophthalmos, right eye: Secondary | ICD-10-CM

## 2015-01-08 DIAGNOSIS — H539 Unspecified visual disturbance: Secondary | ICD-10-CM

## 2015-01-08 NOTE — Telephone Encounter (Signed)
Pt states she saw her eye doctor yesterday, Dr Lanny Cramp [phone]. Pt states she was told she had a small bleed behind her right eye. Pt states Dr Lanny Cramp recommended that she call Dr Johnsie Cancel to have her carotid arteries evaluated.  I spoke with Caryl Pina at Dr Gardiner Barefoot office, she did confirm that Dr Lanny Cramp recommended pt  have an ultrasound of her carotid arteries.   I reviewed with Dr Mare Ferrari (DOD) --Madaline Brilliant to order a carotid doppler to evaluate carotid arteries.   Pt advised I will forward to [facility] to contact pt with appt for carotid doppler.

## 2015-01-08 NOTE — Telephone Encounter (Signed)
New message      Pt went to optometrist yesterday and optometrist is concerned because pt having problem with bad headaches. Optometrist advised pt to call office to discuss  Please advise

## 2015-01-15 ENCOUNTER — Ambulatory Visit (HOSPITAL_COMMUNITY): Payer: Medicare Other | Attending: Cardiovascular Disease

## 2015-01-15 DIAGNOSIS — I6523 Occlusion and stenosis of bilateral carotid arteries: Secondary | ICD-10-CM | POA: Insufficient documentation

## 2015-01-15 DIAGNOSIS — H44811 Hemophthalmos, right eye: Secondary | ICD-10-CM

## 2015-01-15 DIAGNOSIS — H539 Unspecified visual disturbance: Secondary | ICD-10-CM

## 2015-02-26 ENCOUNTER — Telehealth: Payer: Self-pay | Admitting: *Deleted

## 2015-02-26 ENCOUNTER — Ambulatory Visit (INDEPENDENT_AMBULATORY_CARE_PROVIDER_SITE_OTHER): Payer: Medicare Other | Admitting: Nurse Practitioner

## 2015-02-26 ENCOUNTER — Encounter: Payer: Self-pay | Admitting: Nurse Practitioner

## 2015-02-26 VITALS — BP 104/64 | HR 100 | Ht 65.0 in | Wt 211.4 lb

## 2015-02-26 DIAGNOSIS — R197 Diarrhea, unspecified: Secondary | ICD-10-CM

## 2015-02-26 DIAGNOSIS — I2510 Atherosclerotic heart disease of native coronary artery without angina pectoris: Secondary | ICD-10-CM | POA: Diagnosis not present

## 2015-02-26 DIAGNOSIS — R112 Nausea with vomiting, unspecified: Secondary | ICD-10-CM

## 2015-02-26 MED ORDER — ONDANSETRON HCL 8 MG PO TABS: 8.0000 mg | ORAL_TABLET | Freq: Three times a day (TID) | ORAL | Status: DC | PRN

## 2015-02-26 NOTE — Telephone Encounter (Signed)
I faxed a signed release to [facility] of [place], [facility], [place].  ( Phone # [phone]).We did receive labs and CT scan results. Gave to [employer] NP-C on 02-26-2015.

## 2015-02-26 NOTE — Progress Notes (Signed)
HPI :  Patient is a 79 year old female known remotely to Dr. Carlean Purl. She had a colonoscopy for Hemoccult-positive stool in 2009. Four polyps were removed, 3 of which were adenomatous.  Patient states she received a recall colonoscopy letter a couple of years ago but put it on hold because her husband was ill.   Patient here with a 9 day history of diarrhea and vomiting. She was evaluated at hospital in [place] last Sunday. Patient told something was wrong with her appendix and was transported to [facility] ED. After several hours she was cleared for discharge. She complains of several loose stools a day, mainly postprandial. She isn't eating much, vomiting has resolved but still having postprandial nausea. No abnormal weight loss. No abdominal pain but RLQ palpation in ED apparently caused significant discomfort. Patient had antibiotics 6 weeks ago for UTI. She takes multiple home medication including Xarelto. She started Pristiq one month ago, no other recent medication changes.   Past Medical History  Diagnosis Date  . Arthritis     Osteoarthrosis  . CAD (coronary artery disease) 1999    CABG  . Diabetes mellitus   . HLD (hyperlipidemia)   . HTN (hypertension)   . Hypothyroidism   . Colon polyps   . Hiatal hernia   . Peripheral neuropathy   . Anxiety   . Hemorrhoids   . Major depressive disorder   . MI (myocardial infarction)   . DVT (deep venous thrombosis)     Family History  Problem Relation Age of Onset  . Heart disease Father   . Heart disease Mother   . Heart disease Brother   . Heart disease Other     grandfather (side unknown)  . Colon cancer Neg Hx   . Cancer Sister     lumphoma   Social History  Substance Use Topics  . Smoking status: Former Research scientist (life sciences)  . Smokeless tobacco: Never Used  . Alcohol Use: No   Current Outpatient Prescriptions  Medication Sig Dispense Refill  . ALPRAZolam (XANAX) 0.25 MG tablet Take 0.25 mg by mouth 2 (two) times daily.    Marland Kitchen  amLODipine (NORVASC) 10 MG tablet TAKE 1 TABLET DAILY 90 tablet 1  . aspirin 325 MG tablet Take 325 mg by mouth 2 (two) times daily.    . Cholecalciferol (VITAMIN D-3) 5000 UNITS TABS Take 10,000 Units by mouth.    . citalopram (CELEXA) 10 MG tablet Take 10 mg by mouth every evening.    Marland Kitchen esomeprazole (NEXIUM) 20 MG capsule Take 20 mg by mouth daily at 12 noon.    . fenofibrate (TRICOR) 145 MG tablet Take 145 mg by mouth daily.      . hydrOXYzine (ATARAX/VISTARIL) 10 MG tablet Take 10 mg by mouth daily as needed for anxiety.    Marland Kitchen levothyroxine (SYNTHROID, LEVOTHROID) 100 MCG tablet Take 100 mcg by mouth daily.      Marland Kitchen losartan-hydrochlorothiazide (HYZAAR) 50-12.5 MG per tablet TAKE 1 TABLET DAILY 90 tablet 1  . Melatonin 1 MG TABS Take 1 mg by mouth at bedtime.    Marland Kitchen omega-3 acid ethyl esters (LOVAZA) 1 G capsule Take 1 g by mouth every morning.    . Omega-3 Fatty Acids (FISH OIL) 500 MG CAPS Take 1 tablet by mouth daily.    . ondansetron (ZOFRAN) 8 MG tablet Take 1 tablet (8 mg total) by mouth every 8 (eight) hours as needed for nausea or vomiting. 40 tablet 0  . rivaroxaban (XARELTO) 20 MG TABS  tablet Take 20 mg by mouth daily.    . rosuvastatin (CRESTOR) 20 MG tablet Take 20 mg by mouth daily.      . traZODone (DESYREL) 50 MG tablet Take 50 mg by mouth at bedtime.    . vitamin B-12 (CYANOCOBALAMIN) 500 MCG tablet Take 500 mcg by mouth daily.    . vitamin C (ASCORBIC ACID) 500 MG tablet Take 500 mg by mouth daily.    . vitamin E 400 UNIT capsule Take 400 Units by mouth every morning.      No current facility-administered medications for this visit.   Allergies  Allergen Reactions  . Myrbetriq [Mirabegron]     Gross hematuria  . Niaspan [Niacin Er]     Severe flushing  . Nitroglycerin Other (See Comments)    Blood pressure drops down to low     Review of Systems: Positive for anxiety, arthritis, back pain, blood in urine, confusion, depression, fatigue, muscle pains, night sweats,  sleeping problems, excessive urination, urinary pain, and urine leakage. All other systems reviewed and negative except where noted in HPI.    Physical Exam: BP 104/64 mmHg  Pulse 100  Ht 5\' 5"  (1.651 m)  Wt 211 lb 6.4 oz (95.89 kg)  BMI 35.18 kg/m2 Constitutional: Pleasant,well-developed, white female in no acute distress. HEENT: Normocephalic and atraumatic. Conjunctivae are normal. No scleral icterus. Neck supple.  Cardiovascular: Normal rate, regular rhythm.  Pulmonary/chest: Effort normal and breath sounds normal. No wheezing, rales or rhonchi. Abdominal: Soft, nondistended, nontender. Bowel sounds active throughout. There are no masses palpable. No hepatomegaly. Extremities: no edema Lymphadenopathy: No cervical adenopathy noted. Neurological: Alert and oriented to person place and time. Skin: Skin is warm and dry. No rashes noted. Psychiatric: Normal mood and affect. Behavior is normal.   ASSESSMENT AND PLAN:  6. 79 year old female with 9 days history of nausea, vomiting, and diarrhea. Seen in ED in [place] (we have requested records). Patient mentions "enlarged appendix" for which she was transferred to [facility] ED but subsequently released home. I don't see any records from [facility] in [place] but there may be a lag time.   Without any records it is difficult to sort this out, especially since it involves concurrent upper and lower GI symptoms. Patient looks okay, abdominal exam not overly concerning. Will check stool studies given antibiotics 6 weeks ago. Refill Zofran per her request. Will call patient when I get records. Further recommendations pending clinical course.   2. Hx of adenomatous colon polyps in 2009, overdue for surveillance colonoscopy. Will plan for colonoscopy after resolution of acute issues.   3. Multiple medical problems, on multiple medications including xarelto

## 2015-02-26 NOTE — Patient Instructions (Addendum)
Please go to the basement level to have our lab for stool studies. We sent a refill on the Zofran ( Ondansetron ) 8 mg to [place], [place].  We will call you with the stool test results.     Normal BMI (Body Mass Index- based on height and weight) is between 23 and 30. Your BMI today is Body mass index is 35.18 kg/(m^2). Marland Kitchen Please consider follow up  regarding your BMI with your Primary Care Provider.

## 2015-02-27 DIAGNOSIS — R112 Nausea with vomiting, unspecified: Secondary | ICD-10-CM | POA: Insufficient documentation

## 2015-02-27 DIAGNOSIS — R197 Diarrhea, unspecified: Secondary | ICD-10-CM | POA: Insufficient documentation

## 2015-03-02 ENCOUNTER — Encounter (HOSPITAL_COMMUNITY): Payer: Self-pay

## 2015-03-02 ENCOUNTER — Emergency Department (HOSPITAL_COMMUNITY)
Admission: EM | Admit: 2015-03-02 | Discharge: 2015-03-03 | Disposition: A | Discharge status: Home / Self Care | Payer: Medicare Other | Attending: Emergency Medicine | Admitting: Emergency Medicine

## 2015-03-02 DIAGNOSIS — R4182 Altered mental status, unspecified: Secondary | ICD-10-CM | POA: Diagnosis not present

## 2015-03-02 DIAGNOSIS — Z86718 Personal history of other venous thrombosis and embolism: Secondary | ICD-10-CM | POA: Diagnosis not present

## 2015-03-02 DIAGNOSIS — Z87891 Personal history of nicotine dependence: Secondary | ICD-10-CM | POA: Insufficient documentation

## 2015-03-02 DIAGNOSIS — M1990 Unspecified osteoarthritis, unspecified site: Secondary | ICD-10-CM | POA: Insufficient documentation

## 2015-03-02 DIAGNOSIS — Z79899 Other long term (current) drug therapy: Secondary | ICD-10-CM | POA: Diagnosis not present

## 2015-03-02 DIAGNOSIS — I2510 Atherosclerotic heart disease of native coronary artery without angina pectoris: Secondary | ICD-10-CM | POA: Insufficient documentation

## 2015-03-02 DIAGNOSIS — R1031 Right lower quadrant pain: Secondary | ICD-10-CM | POA: Insufficient documentation

## 2015-03-02 DIAGNOSIS — I10 Essential (primary) hypertension: Secondary | ICD-10-CM | POA: Diagnosis not present

## 2015-03-02 DIAGNOSIS — Z8719 Personal history of other diseases of the digestive system: Secondary | ICD-10-CM | POA: Diagnosis not present

## 2015-03-02 DIAGNOSIS — I252 Old myocardial infarction: Secondary | ICD-10-CM | POA: Insufficient documentation

## 2015-03-02 DIAGNOSIS — E785 Hyperlipidemia, unspecified: Secondary | ICD-10-CM | POA: Diagnosis not present

## 2015-03-02 DIAGNOSIS — Z86010 Personal history of colonic polyps: Secondary | ICD-10-CM | POA: Diagnosis not present

## 2015-03-02 DIAGNOSIS — E039 Hypothyroidism, unspecified: Secondary | ICD-10-CM | POA: Diagnosis not present

## 2015-03-02 DIAGNOSIS — E119 Type 2 diabetes mellitus without complications: Secondary | ICD-10-CM | POA: Diagnosis not present

## 2015-03-02 DIAGNOSIS — Z7982 Long term (current) use of aspirin: Secondary | ICD-10-CM | POA: Insufficient documentation

## 2015-03-02 DIAGNOSIS — Z8669 Personal history of other diseases of the nervous system and sense organs: Secondary | ICD-10-CM | POA: Insufficient documentation

## 2015-03-02 DIAGNOSIS — R112 Nausea with vomiting, unspecified: Secondary | ICD-10-CM | POA: Diagnosis not present

## 2015-03-02 HISTORY — DX: Heart failure, unspecified: I50.9

## 2015-03-02 LAB — COMPREHENSIVE METABOLIC PANEL
ALBUMIN: 3.9 g/dL (ref 3.5–5.0)
ALK PHOS: 45 U/L (ref 38–126)
ALT: 22 U/L (ref 14–54)
ANION GAP: 9 (ref 5–15)
AST: 29 U/L (ref 15–41)
BILIRUBIN TOTAL: 0.4 mg/dL (ref 0.3–1.2)
BUN: 12 mg/dL (ref 6–20)
CALCIUM: 9.6 mg/dL (ref 8.9–10.3)
CO2: 30 mmol/L (ref 22–32)
CREATININE: 0.81 mg/dL (ref 0.44–1.00)
Chloride: 101 mmol/L (ref 101–111)
GFR calc Af Amer: >60 mL/min (ref >60)
GFR calc non Af Amer: >60 mL/min (ref >60)
GLUCOSE: 121 mg/dL — AB (ref 65–99)
Potassium: 3.8 mmol/L (ref 3.5–5.1)
SODIUM: 140 mmol/L (ref 135–145)
TOTAL PROTEIN: 6.9 g/dL (ref 6.5–8.1)

## 2015-03-02 LAB — CBC
HCT: 39.4 % (ref 36.0–46.0)
HEMOGLOBIN: 12.5 g/dL (ref 12.0–15.0)
MCH: 27.4 pg (ref 26.0–34.0)
MCHC: 31.7 g/dL (ref 30.0–36.0)
MCV: 86.2 fL (ref 78.0–100.0)
Platelets: 321 10*3/uL (ref 150–400)
RBC: 4.57 MIL/uL (ref 3.87–5.11)
RDW: 15.9 % — AB (ref 11.5–15.5)
WBC: 7.4 10*3/uL (ref 4.0–10.5)

## 2015-03-02 LAB — URINALYSIS, ROUTINE W REFLEX MICROSCOPIC
BILIRUBIN URINE: NEGATIVE
GLUCOSE, UA: NEGATIVE mg/dL
HGB URINE DIPSTICK: NEGATIVE
Ketones, ur: NEGATIVE mg/dL
Nitrite: NEGATIVE
PROTEIN: NEGATIVE mg/dL
Specific Gravity, Urine: 1.012 (ref 1.005–1.030)
Urobilinogen, UA: 0.2 mg/dL (ref 0.0–1.0)
pH: 6 (ref 5.0–8.0)

## 2015-03-02 LAB — URINE MICROSCOPIC-ADD ON

## 2015-03-02 LAB — LIPASE, BLOOD: Lipase: 25 U/L (ref 22–51)

## 2015-03-02 MED ORDER — IOHEXOL 300 MG/ML  SOLN
25.0000 mL | Freq: Once | INTRAMUSCULAR | Status: AC | PRN
  Administered 2015-03-02: 25 mL via ORAL

## 2015-03-02 MED ORDER — ONDANSETRON HCL 4 MG/2ML IJ SOLN
4.0000 mg | Freq: Once | INTRAMUSCULAR | Status: AC
  Administered 2015-03-03: 4 mg via INTRAVENOUS
  Filled 2015-03-02: qty 2

## 2015-03-02 MED ORDER — SODIUM CHLORIDE 0.9 % IV BOLUS (SEPSIS)
1000.0000 mL | Freq: Once | INTRAVENOUS | Status: AC
  Administered 2015-03-03: 1000 mL via INTRAVENOUS

## 2015-03-02 NOTE — ED Notes (Addendum)
Pt reports onset 3 weeks nausea, diarrhea and and intermittant vomiting- last vomit 2-3 days ago.  Pt was seen at a [facility] ED, was given antibiotics, and xrays, was referred to [facility] for enlarged appendix.  Due to normal WBC [facility] did not want to remove yet and was referred To GI. Pt seen GI 02-26-15 was given refill on Zofran and was advised to collect stool sample for C- Diff.  Pt has not had any stool until today- loose today.  Pt has stool sample with her now on ice.  Pt c/o nausea, generally "not feeling well" and son states she has had some confusion today, pt reports she "doesn't feel like herself".  Pt is A&Ox4 at this time.

## 2015-03-03 ENCOUNTER — Emergency Department (HOSPITAL_COMMUNITY): Payer: Medicare Other

## 2015-03-03 ENCOUNTER — Encounter (HOSPITAL_COMMUNITY): Payer: Self-pay

## 2015-03-03 ENCOUNTER — Other Ambulatory Visit: Payer: Self-pay | Admitting: General Surgery

## 2015-03-03 DIAGNOSIS — R1031 Right lower quadrant pain: Secondary | ICD-10-CM | POA: Diagnosis not present

## 2015-03-03 LAB — I-STAT TROPONIN, ED: TROPONIN I, POC: 0 ng/mL (ref 0.00–0.08)

## 2015-03-03 MED ORDER — SODIUM CHLORIDE 0.9 % IV BOLUS (SEPSIS): 1000.0000 mL | Freq: Once | INTRAVENOUS | Status: DC

## 2015-03-03 MED ORDER — PROMETHAZINE HCL 25 MG PO TABS: 25.0000 mg | ORAL_TABLET | Freq: Three times a day (TID) | ORAL | Status: DC | PRN

## 2015-03-03 MED ORDER — IOHEXOL 300 MG/ML  SOLN
80.0000 mL | Freq: Once | INTRAMUSCULAR | Status: AC | PRN
  Administered 2015-03-03: 100 mL via INTRAVENOUS

## 2015-03-03 NOTE — ED Notes (Signed)
Patient returned to room from CT via stretcher.

## 2015-03-03 NOTE — ED Notes (Signed)
Dr. Claudine Mouton at bedside at this time.

## 2015-03-03 NOTE — Discharge Instructions (Signed)
Return here as needed.  Follow-up with the surgeon provided.  °

## 2015-03-03 NOTE — ED Provider Notes (Signed)
CSN: [id number]     Arrival date & time 03/02/15  1943 History   First MD Initiated Contact with Patient 03/02/15 2228     Chief Complaint  Patient presents with  . Nausea  . Abdominal Pain  . Altered Mental Status     (Consider location/radiation/quality/duration/timing/severity/associated sxs/prior Treatment) HPI Patient presents to the emergency department with continued abdominal pain with nausea.  Patient, states she has had 3 weeks' worth of nausea, diarrhea, vomiting and abdominal pain.  The patient.  She was seen in ER 2 weeks ago and found to have an enlarged appendix.  She was sent to [facility] where she was discharged and given follow-up with GI.  The patient states that she has not been feeling well.  She was ordered to obtain a stool specimen for the GI doctor.  Patient denies chest pain, shortness of breath, weakness, dizziness, headache, blurred vision, back pain, neck pain, dysuria, incontinence, bloody stool, hematemesis, rash, fever, near syncope or syncope.  The patient states that nothing seems make her condition better or worse Past Medical History  Diagnosis Date  . Arthritis     Osteoarthrosis  . CAD (coronary artery disease) 1999    CABG  . Diabetes mellitus   . HLD (hyperlipidemia)   . HTN (hypertension)   . Hypothyroidism   . Colon polyps   . Hiatal hernia   . Peripheral neuropathy   . Anxiety   . Hemorrhoids   . Major depressive disorder   . MI (myocardial infarction)   . DVT (deep venous thrombosis)    Past Surgical History  Procedure Laterality Date  . Coronary artery bypass graft  1999  . Carotid stent  07/1998    stent RCA   . Coronary stent placement  10/2001    circumflex stent  . Lumbar laminectomy      fusion 1971  . Thyroid surgery    . Cholecystectomy  2011  . Esophagogastroduodenoscopy    . Colonoscopy w/ biopsies     Family History  Problem Relation Age of Onset  . Heart disease Father   . Heart disease Mother   . Heart  disease Brother   . Heart disease Other     grandfather (side unknown)  . Colon cancer Neg Hx   . Cancer Sister     lumphoma   Social History  Substance Use Topics  . Smoking status: Former Research scientist (life sciences)  . Smokeless tobacco: Never Used  . Alcohol Use: No   OB History    No data available     Review of Systems  All other systems negative except as documented in the HPI. All pertinent positives and negatives as reviewed in the HPI.  Allergies  Myrbetriq; Niaspan; and Nitroglycerin  Home Medications   Prior to Admission medications   Medication Sig Start Date End Date Taking? Authorizing Provider  ALPRAZolam (XANAX) 0.25 MG tablet Take 0.25 mg by mouth 2 (two) times daily.    Historical Provider, MD  amLODipine (NORVASC) 10 MG tablet TAKE 1 TABLET DAILY 09/12/14   Josue Hector, MD  aspirin 325 MG tablet Take 325 mg by mouth 2 (two) times daily.    Historical Provider, MD  Cholecalciferol (VITAMIN D-3) 5000 UNITS TABS Take 10,000 Units by mouth.    Historical Provider, MD  citalopram (CELEXA) 10 MG tablet Take 10 mg by mouth every evening.    Historical Provider, MD  esomeprazole (NEXIUM) 20 MG capsule Take 20 mg by mouth daily at 12  noon.    Historical Provider, MD  fenofibrate (TRICOR) 145 MG tablet Take 145 mg by mouth daily.      Historical Provider, MD  hydrOXYzine (ATARAX/VISTARIL) 10 MG tablet Take 10 mg by mouth daily as needed for anxiety.    Historical Provider, MD  levothyroxine (SYNTHROID, LEVOTHROID) 100 MCG tablet Take 100 mcg by mouth daily.      Historical Provider, MD  losartan-hydrochlorothiazide [facility]) 50-12.5 MG per tablet TAKE 1 TABLET DAILY 10/16/14   Josue Hector, MD  Melatonin 1 MG TABS Take 1 mg by mouth at bedtime.    Historical Provider, MD  omega-3 acid ethyl esters (LOVAZA) 1 G capsule Take 1 g by mouth every morning.    Historical Provider, MD  Omega-3 Fatty Acids (FISH OIL) 500 MG CAPS Take 1 tablet by mouth daily.    Historical Provider, MD   ondansetron (ZOFRAN) 8 MG tablet Take 1 tablet (8 mg total) by mouth every 8 (eight) hours as needed for nausea or vomiting. 02/26/15   Willia Craze, NP  rivaroxaban (XARELTO) 20 MG TABS tablet Take 20 mg by mouth daily.    Historical Provider, MD  rosuvastatin (CRESTOR) 20 MG tablet Take 20 mg by mouth daily.      Historical Provider, MD  traZODone (DESYREL) 50 MG tablet Take 50 mg by mouth at bedtime.    Historical Provider, MD  vitamin B-12 (CYANOCOBALAMIN) 500 MCG tablet Take 500 mcg by mouth daily.    Historical Provider, MD  vitamin C (ASCORBIC ACID) 500 MG tablet Take 500 mg by mouth daily.    Historical Provider, MD  vitamin E 400 UNIT capsule Take 400 Units by mouth every morning.     Historical Provider, MD   BP 123/46 mmHg  Pulse 80  Temp(Src) 98.1 F (36.7 C) (Oral)  Resp 16  Ht 5\' 5"  (1.651 m)  Wt 212 lb 9.6 oz (96.435 kg)  BMI 35.38 kg/m2  SpO2 96% Physical Exam  Constitutional: She is oriented to person, place, and time. She appears well-developed and well-nourished. No distress.  HENT:  Head: Normocephalic and atraumatic.  Mouth/Throat: Oropharynx is clear and moist.  Eyes: Pupils are equal, round, and reactive to light.  Neck: Normal range of motion. Neck supple.  Cardiovascular: Normal rate, regular rhythm and normal heart sounds.  Exam reveals no gallop and no friction rub.   No murmur heard. Pulmonary/Chest: Effort normal and breath sounds normal. No respiratory distress.  Abdominal: Soft. Bowel sounds are normal. She exhibits no distension. There is tenderness. There is no rebound and no guarding.  Musculoskeletal: She exhibits no edema.  Neurological: She is alert and oriented to person, place, and time. She exhibits normal muscle tone. Coordination normal.  Skin: Skin is warm and dry. No rash noted. No erythema.  Psychiatric: She has a normal mood and affect. Her behavior is normal.  Nursing note and vitals reviewed.   ED Course  Procedures (including  critical care time) Labs Review Labs Reviewed  COMPREHENSIVE METABOLIC PANEL - Abnormal; Notable for the following:    Glucose, Bld 121 (*)    All other components within normal limits  CBC - Abnormal; Notable for the following:    RDW 15.9 (*)    All other components within normal limits  URINALYSIS, ROUTINE W REFLEX MICROSCOPIC (NOT AT [facility]) - Abnormal; Notable for the following:    Leukocytes, UA SMALL (*)    All other components within normal limits  LIPASE, BLOOD  URINE MICROSCOPIC-ADD  ON  I-STAT CG4 LACTIC ACID, ED  I-STAT TROPOININ, ED    Imaging Review Ct Abdomen Pelvis W Contrast  03/03/2015   CLINICAL DATA:  Right-sided abdominal pain. Nausea vomiting and diarrhea.  EXAM: CT ABDOMEN AND PELVIS WITH CONTRAST  TECHNIQUE: Multidetector CT imaging of the abdomen and pelvis was performed using the standard protocol following bolus administration of intravenous contrast.  CONTRAST:  160mL OMNIPAQUE IOHEXOL 300 MG/ML  SOLN  COMPARISON:  CT abdomen 05/30/2003  FINDINGS: Post CABG with calcification of the native coronary arteries. Atherosclerosis of the distal descending thoracic aorta. Linear atelectasis within the left lung base.  Postcholecystectomy with clips in the gallbladder fossa. No biliary dilatation. The liver, spleen, adrenal glands, and pancreas are normal. Mild prominence of the right renal collecting system, may reflect an extrarenal pelvis configuration. There is symmetric renal enhancement and excretion. No significant perinephric stranding. No urolithiasis. Both ureters are decompressed.  The stomach is physiologically distended. There are no dilated or thickened bowel loops. Small to moderate volume of colonic stool without colonic wall thickening. Minimal distal diverticulosis without diverticulitis. The appendix is tentatively identified measuring 9 mm with intraluminal fluid. There is no surrounding fat stranding or inflammatory change. No appendicolith.  No  retroperitoneal adenopathy. Abdominal aorta is normal in caliber. Dense atherosclerosis of the abdominal aorta and its branches.  Within the pelvis the urinary bladder is physiologically distended. The uterus remains in situ. No adnexal mass. There is no pelvic free fluid.  There are no acute or suspicious osseous abnormalities. Post lumbar laminectomies. Diffuse degenerative disc disease and facet arthropathy throughout the lumbar spine.  IMPRESSION: 1. The appendix is fluid-filled and prominent in size measuring 9 mm, however there is no surrounding inflammatory change. On remote prior CT from 2004, the appendix was only partially included in the field of view, however was fluid-filled and measuring 19 mm at that time. Current findings may reflect sequela of prior appendicitis and may be normal for this patient given the lack of surrounding inflammatory change. Early acute appendicitis is not entirely excluded given history of right-sided abdominal pain. 2. Minimal distal diverticulosis without diverticulitis.   Electronically Signed   By: Jeb Levering M.D.   On: 03/03/2015 01:29   I have personally reviewed and evaluated these images and lab results as part of my medical decision-making.    I spoke with Dr. Redmond Pulling of general surgery and reviewed the findings.  The he felt that the patient follow-up in his office as an outpatient.  The patient does not have any guarding or rebound tenderness.  Patient agrees the plan and all questions were answered  Dalia Heading, PA-C 03/03/15 0222  Everlene Balls, MD 03/03/15 [phone]

## 2015-03-03 NOTE — Progress Notes (Signed)
[place] note  MDM: Patient transferred due to concern for appendicitis. Physical exam completely benign. CT scan from outside hospital "The appendix is dilated to 58mm without periappendiceal stranding. Cannot exclude appendicitis." WBC count of 7, and today has been afebrile. Patient was recently with diarrhea, and appears mildly dehydrated. Given benign physical exam, afebrile, no white count, i consider this an asymptomatic appendiceal dilatation. Patient expressing desire to go home, and in no distress. Other than the read of 10mm dilation, there is no sign of appendicitis.   Clinical Impression:  1. Abdominal pain, unspecified abdominal location    Agree with Ms. Vanita Ingles assessment and plan. Gatha Mayer, MD, [facility]

## 2015-03-05 ENCOUNTER — Other Ambulatory Visit: Payer: Self-pay | Admitting: Surgery

## 2015-03-10 ENCOUNTER — Telehealth: Payer: Self-pay | Admitting: Cardiovascular Disease

## 2015-03-10 NOTE — Telephone Encounter (Signed)
New problem   Pt want to make sure it's ok for her to take generic for Effexlor which is Venlanfaxine hcler 75mg . Please advise.

## 2015-03-10 NOTE — Telephone Encounter (Signed)
DISCUSSED  WITH PHARMACISTS  MED IS  OKAY  TO  TAKE  WITH  CURRENT  MEDS    BUT  MED  COULD  CAUSE  AN INCREASE IN B/P   PT   AWARE   NEEDS TO  MONITOR .Adonis Housekeeper

## 2015-03-13 ENCOUNTER — Other Ambulatory Visit: Payer: Self-pay | Admitting: Cardiovascular Disease

## 2015-03-13 HISTORY — PX: COLON SURGERY: SHX602

## 2015-03-24 ENCOUNTER — Emergency Department (HOSPITAL_COMMUNITY)
Admission: EM | Admit: 2015-03-24 | Discharge: 2015-03-24 | Disposition: A | Discharge status: Home / Self Care | Payer: Medicare Other | Attending: Emergency Medicine | Admitting: Emergency Medicine

## 2015-03-24 ENCOUNTER — Encounter (HOSPITAL_COMMUNITY): Payer: Self-pay | Admitting: *Deleted

## 2015-03-24 DIAGNOSIS — I2510 Atherosclerotic heart disease of native coronary artery without angina pectoris: Secondary | ICD-10-CM | POA: Insufficient documentation

## 2015-03-24 DIAGNOSIS — F419 Anxiety disorder, unspecified: Secondary | ICD-10-CM | POA: Diagnosis not present

## 2015-03-24 DIAGNOSIS — G629 Polyneuropathy, unspecified: Secondary | ICD-10-CM | POA: Diagnosis not present

## 2015-03-24 DIAGNOSIS — Z951 Presence of aortocoronary bypass graft: Secondary | ICD-10-CM | POA: Diagnosis not present

## 2015-03-24 DIAGNOSIS — E119 Type 2 diabetes mellitus without complications: Secondary | ICD-10-CM | POA: Diagnosis not present

## 2015-03-24 DIAGNOSIS — I509 Heart failure, unspecified: Secondary | ICD-10-CM | POA: Diagnosis not present

## 2015-03-24 DIAGNOSIS — Z7982 Long term (current) use of aspirin: Secondary | ICD-10-CM | POA: Diagnosis not present

## 2015-03-24 DIAGNOSIS — Z86718 Personal history of other venous thrombosis and embolism: Secondary | ICD-10-CM | POA: Insufficient documentation

## 2015-03-24 DIAGNOSIS — Z87891 Personal history of nicotine dependence: Secondary | ICD-10-CM | POA: Insufficient documentation

## 2015-03-24 DIAGNOSIS — E785 Hyperlipidemia, unspecified: Secondary | ICD-10-CM | POA: Diagnosis not present

## 2015-03-24 DIAGNOSIS — K5900 Constipation, unspecified: Secondary | ICD-10-CM | POA: Insufficient documentation

## 2015-03-24 DIAGNOSIS — Z9861 Coronary angioplasty status: Secondary | ICD-10-CM | POA: Diagnosis not present

## 2015-03-24 DIAGNOSIS — R630 Anorexia: Secondary | ICD-10-CM | POA: Diagnosis not present

## 2015-03-24 DIAGNOSIS — F329 Major depressive disorder, single episode, unspecified: Secondary | ICD-10-CM | POA: Diagnosis not present

## 2015-03-24 DIAGNOSIS — Z9049 Acquired absence of other specified parts of digestive tract: Secondary | ICD-10-CM | POA: Insufficient documentation

## 2015-03-24 DIAGNOSIS — R112 Nausea with vomiting, unspecified: Secondary | ICD-10-CM | POA: Diagnosis not present

## 2015-03-24 DIAGNOSIS — E039 Hypothyroidism, unspecified: Secondary | ICD-10-CM | POA: Insufficient documentation

## 2015-03-24 DIAGNOSIS — R1013 Epigastric pain: Secondary | ICD-10-CM | POA: Diagnosis present

## 2015-03-24 DIAGNOSIS — R1010 Upper abdominal pain, unspecified: Secondary | ICD-10-CM | POA: Insufficient documentation

## 2015-03-24 DIAGNOSIS — I10 Essential (primary) hypertension: Secondary | ICD-10-CM | POA: Insufficient documentation

## 2015-03-24 DIAGNOSIS — Z79899 Other long term (current) drug therapy: Secondary | ICD-10-CM | POA: Insufficient documentation

## 2015-03-24 DIAGNOSIS — Z86010 Personal history of colonic polyps: Secondary | ICD-10-CM | POA: Insufficient documentation

## 2015-03-24 DIAGNOSIS — M1990 Unspecified osteoarthritis, unspecified site: Secondary | ICD-10-CM | POA: Insufficient documentation

## 2015-03-24 DIAGNOSIS — I252 Old myocardial infarction: Secondary | ICD-10-CM | POA: Insufficient documentation

## 2015-03-24 LAB — COMPREHENSIVE METABOLIC PANEL
ALBUMIN: 3.8 g/dL (ref 3.5–5.0)
ALT: 21 U/L (ref 14–54)
ANION GAP: 8 (ref 5–15)
AST: 23 U/L (ref 15–41)
Alkaline Phosphatase: 40 U/L (ref 38–126)
BILIRUBIN TOTAL: 0.5 mg/dL (ref 0.3–1.2)
BUN: 11 mg/dL (ref 6–20)
CO2: 32 mmol/L (ref 22–32)
Calcium: 9.6 mg/dL (ref 8.9–10.3)
Chloride: 101 mmol/L (ref 101–111)
Creatinine, Ser: 0.78 mg/dL (ref 0.44–1.00)
GFR calc Af Amer: >60 mL/min (ref >60)
GFR calc non Af Amer: >60 mL/min (ref >60)
GLUCOSE: 101 mg/dL — AB (ref 65–99)
POTASSIUM: 3.5 mmol/L (ref 3.5–5.1)
SODIUM: 141 mmol/L (ref 135–145)
TOTAL PROTEIN: 6.6 g/dL (ref 6.5–8.1)

## 2015-03-24 LAB — CBC
HEMATOCRIT: 38.7 % (ref 36.0–46.0)
HEMOGLOBIN: 12.1 g/dL (ref 12.0–15.0)
MCH: 26.7 pg (ref 26.0–34.0)
MCHC: 31.3 g/dL (ref 30.0–36.0)
MCV: 85.2 fL (ref 78.0–100.0)
Platelets: 302 10*3/uL (ref 150–400)
RBC: 4.54 MIL/uL (ref 3.87–5.11)
RDW: 15.5 % (ref 11.5–15.5)
WBC: 7.9 10*3/uL (ref 4.0–10.5)

## 2015-03-24 LAB — URINALYSIS, ROUTINE W REFLEX MICROSCOPIC
Bilirubin Urine: NEGATIVE
Glucose, UA: NEGATIVE mg/dL
HGB URINE DIPSTICK: NEGATIVE
Ketones, ur: NEGATIVE mg/dL
Leukocytes, UA: NEGATIVE
Nitrite: NEGATIVE
Protein, ur: NEGATIVE mg/dL
SPECIFIC GRAVITY, URINE: 1.019 (ref 1.005–1.030)
UROBILINOGEN UA: 1 mg/dL (ref 0.0–1.0)
pH: 6.5 (ref 5.0–8.0)

## 2015-03-24 LAB — LIPASE, BLOOD: LIPASE: 21 U/L — AB (ref 22–51)

## 2015-03-24 MED ORDER — ONDANSETRON 4 MG PO TBDP
ORAL_TABLET | ORAL | Status: AC
  Filled 2015-03-24: qty 1

## 2015-03-24 MED ORDER — ONDANSETRON 4 MG PO TBDP
4.0000 mg | ORAL_TABLET | Freq: Once | ORAL | Status: AC | PRN
  Administered 2015-03-24: 4 mg via ORAL

## 2015-03-24 MED ORDER — ONDANSETRON 4 MG PO TBDP: 4.0000 mg | ORAL_TABLET | Freq: Three times a day (TID) | ORAL | Status: DC | PRN

## 2015-03-24 NOTE — ED Notes (Signed)
Pt reports right side abd pain x 2 weeks with n/v/d and feeling bloated. No acute distress noted at triage.

## 2015-03-24 NOTE — Discharge Instructions (Signed)
Abdominal Pain, Women °Abdominal (stomach, pelvic, or belly) pain can be caused by many things. It is important to tell your doctor: °· The location of the pain. °· Does it come and go or is it present all the time? °· Are there things that start the pain (eating certain foods, exercise)? °· Are there other symptoms associated with the pain (fever, nausea, vomiting, diarrhea)? °All of this is helpful to know when trying to find the cause of the pain. °CAUSES  °· Stomach: virus or bacteria infection, or ulcer. °· Intestine: appendicitis (inflamed appendix), regional ileitis (Crohn's disease), ulcerative colitis (inflamed colon), irritable bowel syndrome, diverticulitis (inflamed diverticulum of the colon), or cancer of the stomach or intestine. °· Gallbladder disease or stones in the gallbladder. °· Kidney disease, kidney stones, or infection. °· Pancreas infection or cancer. °· Fibromyalgia (pain disorder). °· Diseases of the female organs: °¨ Uterus: fibroid (non-cancerous) tumors or infection. °¨ Fallopian tubes: infection or tubal pregnancy. °¨ Ovary: cysts or tumors. °¨ Pelvic adhesions (scar tissue). °¨ Endometriosis (uterus lining tissue growing in the pelvis and on the pelvic organs). °¨ Pelvic congestion syndrome (female organs filling up with blood just before the menstrual period). °¨ Pain with the menstrual period. °¨ Pain with ovulation (producing an egg). °¨ Pain with an IUD (intrauterine device, birth control) in the uterus. °¨ Cancer of the female organs. °· Functional pain (pain not caused by a disease, may improve without treatment). °· Psychological pain. °· Depression. °DIAGNOSIS  °Your doctor will decide the seriousness of your pain by doing an examination. °· Blood tests. °· X-rays. °· Ultrasound. °· CT scan (computed tomography, special type of X-ray). °· MRI (magnetic resonance imaging). °· Cultures, for infection. °· Barium enema (dye inserted in the large intestine, to better view it with  X-rays). °· Colonoscopy (looking in intestine with a lighted tube). °· Laparoscopy (minor surgery, looking in abdomen with a lighted tube). °· Major abdominal exploratory surgery (looking in abdomen with a large incision). °TREATMENT  °The treatment will depend on the cause of the pain.  °· Many cases can be observed and treated at home. °· Over-the-counter medicines recommended by your caregiver. °· Prescription medicine. °· Antibiotics, for infection. °· Birth control pills, for painful periods or for ovulation pain. °· Hormone treatment, for endometriosis. °· Nerve blocking injections. °· Physical therapy. °· Antidepressants. °· Counseling with a psychologist or psychiatrist. °· Minor or major surgery. °HOME CARE INSTRUCTIONS  °· Do not take laxatives, unless directed by your caregiver. °· Take over-the-counter pain medicine only if ordered by your caregiver. Do not take aspirin because it can cause an upset stomach or bleeding. °· Try a clear liquid diet (broth or water) as ordered by your caregiver. Slowly move to a bland diet, as tolerated, if the pain is related to the stomach or intestine. °· Have a thermometer and take your temperature several times a day, and record it. °· Bed rest and sleep, if it helps the pain. °· Avoid sexual intercourse, if it causes pain. °· Avoid stressful situations. °· Keep your follow-up appointments and tests, as your caregiver orders. °· If the pain does not go away with medicine or surgery, you may try: °¨ Acupuncture. °¨ Relaxation exercises (yoga, meditation). °¨ Group therapy. °¨ Counseling. °SEEK MEDICAL CARE IF:  °· You notice certain foods cause stomach pain. °· Your home care treatment is not helping your pain. °· You need stronger pain medicine. °· You want your IUD removed. °· You feel faint or   lightheaded. °· You develop nausea and vomiting. °· You develop a rash. °· You are having side effects or an allergy to your medicine. °SEEK IMMEDIATE MEDICAL CARE IF:  °· Your  pain does not go away or gets worse. °· You have a fever. °· Your pain is felt only in portions of the abdomen. The right side could possibly be appendicitis. The left lower portion of the abdomen could be colitis or diverticulitis. °· You are passing blood in your stools (bright red or black tarry stools, with or without vomiting). °· You have blood in your urine. °· You develop chills, with or without a fever. °· You pass out. °MAKE SURE YOU:  °· Understand these instructions. °· Will watch your condition. °· Will get help right away if you are not doing well or get worse. °Document Released: 04/25/2007 Document Revised: 11/12/2013 Document Reviewed: 05/15/2009 °ExitCare® Patient Information ©2015 [employer]. This information is not intended to replace advice given to you by your health care provider. Make sure you discuss any questions you have with your health care provider. ° °

## 2015-03-24 NOTE — ED Provider Notes (Signed)
CSN: [id number]     Arrival date & time 03/24/15  1521 History   First MD Initiated Contact with Patient 03/24/15 2002     Chief Complaint  Patient presents with  . Abdominal Pain     (Consider location/radiation/quality/duration/timing/severity/associated sxs/prior Treatment) Patient is a 79 y.o. female presenting with abdominal pain.  Abdominal Pain Pain location:  Epigastric Pain quality: bloating   Pain quality comment:  Swelling Pain radiates to:  RLQ Pain severity:  Moderate Onset quality:  Gradual Duration:  3 weeks (since 8/12) Timing:  Constant Chronicity:  New Relieved by: stool softner, alka seltzer. Worsened by:  Nothing tried Ineffective treatments:  None tried Associated symptoms: constipation, fatigue, flatus, nausea (every day) and vomiting (x23mo on and off, emesis last night)   Associated symptoms: no anorexia (appetite normal), no chest pain, no cough, no diarrhea, no dysuria, no fever, no shortness of breath and no sore throat     Past Medical History  Diagnosis Date  . Arthritis     Osteoarthrosis  . CAD (coronary artery disease) 1999    CABG  . Diabetes mellitus   . HLD (hyperlipidemia)   . HTN (hypertension)   . Hypothyroidism   . Colon polyps   . Hiatal hernia   . Peripheral neuropathy   . Anxiety   . Hemorrhoids   . Major depressive disorder   . MI (myocardial infarction)   . DVT (deep venous thrombosis)   . CHF (congestive heart failure)    Past Surgical History  Procedure Laterality Date  . Coronary artery bypass graft  1999  . Carotid stent  07/1998    stent RCA   . Coronary stent placement  10/2001    circumflex stent  . Lumbar laminectomy      fusion 1971  . Thyroid surgery    . Cholecystectomy  2011  . Esophagogastroduodenoscopy    . Colonoscopy w/ biopsies     Family History  Problem Relation Age of Onset  . Heart disease Father   . Heart disease Mother   . Heart disease Brother   . Heart disease Other     grandfather  (side unknown)  . Colon cancer Neg Hx   . Cancer Sister     lumphoma   Social History  Substance Use Topics  . Smoking status: Former Research scientist (life sciences)  . Smokeless tobacco: Never Used  . Alcohol Use: No   OB History    No data available     Review of Systems  Constitutional: Positive for appetite change and fatigue. Negative for fever.  HENT: Negative for sore throat.   Eyes: Negative for visual disturbance.  Respiratory: Negative for cough and shortness of breath.   Cardiovascular: Negative for chest pain.  Gastrointestinal: Positive for nausea (every day), vomiting (x41mo on and off, emesis last night), abdominal pain, constipation and flatus. Negative for diarrhea and anorexia (appetite normal).  Genitourinary: Negative for dysuria and difficulty urinating.  Musculoskeletal: Negative for back pain and neck pain.  Skin: Negative for rash.  Neurological: Negative for syncope and headaches.      Allergies  Myrbetriq; Niaspan; and Nitroglycerin  Home Medications   Prior to Admission medications   Medication Sig Start Date End Date Taking? Authorizing Provider  ALPRAZolam (XANAX) 0.25 MG tablet Take 0.25 mg by mouth 2 (two) times daily.    Historical Provider, MD  amLODipine (NORVASC) 10 MG tablet TAKE 1 TABLET DAILY 03/13/15   Josue Hector, MD  aspirin 325 MG tablet Take  325 mg by mouth 2 (two) times daily.    Historical Provider, MD  Cholecalciferol (VITAMIN D-3) 5000 UNITS TABS Take 10,000 Units by mouth.    Historical Provider, MD  citalopram (CELEXA) 10 MG tablet Take 10 mg by mouth every evening.    Historical Provider, MD  esomeprazole (NEXIUM) 20 MG capsule Take 20 mg by mouth daily at 12 noon.    Historical Provider, MD  fenofibrate (TRICOR) 145 MG tablet Take 145 mg by mouth daily.      Historical Provider, MD  hydrOXYzine (ATARAX/VISTARIL) 10 MG tablet Take 10 mg by mouth daily as needed for anxiety.    Historical Provider, MD  levothyroxine (SYNTHROID, LEVOTHROID) 100 MCG  tablet Take 100 mcg by mouth daily.      Historical Provider, MD  losartan-hydrochlorothiazide [facility]) 50-12.5 MG per tablet TAKE 1 TABLET DAILY 10/16/14   Josue Hector, MD  Melatonin 1 MG TABS Take 1 mg by mouth at bedtime.    Historical Provider, MD  omega-3 acid ethyl esters (LOVAZA) 1 G capsule Take 1 g by mouth every morning.    Historical Provider, MD  Omega-3 Fatty Acids (FISH OIL) 500 MG CAPS Take 1 tablet by mouth daily.    Historical Provider, MD  ondansetron (ZOFRAN) 8 MG tablet Take 1 tablet (8 mg total) by mouth every 8 (eight) hours as needed for nausea or vomiting. 02/26/15   Willia Craze, NP  promethazine (PHENERGAN) 25 MG tablet Take 1 tablet (25 mg total) by mouth every 8 (eight) hours as needed for nausea or vomiting. 03/03/15   Dalia Heading, PA-C  rivaroxaban (XARELTO) 20 MG TABS tablet Take 20 mg by mouth daily.    Historical Provider, MD  rosuvastatin (CRESTOR) 20 MG tablet Take 20 mg by mouth daily.      Historical Provider, MD  traZODone (DESYREL) 50 MG tablet Take 50 mg by mouth at bedtime.    Historical Provider, MD  vitamin B-12 (CYANOCOBALAMIN) 500 MCG tablet Take 500 mcg by mouth daily.    Historical Provider, MD  vitamin C (ASCORBIC ACID) 500 MG tablet Take 500 mg by mouth daily.    Historical Provider, MD  vitamin E 400 UNIT capsule Take 400 Units by mouth every morning.     Historical Provider, MD   BP 121/43 mmHg  Pulse 80  Temp(Src) 97.8 F (36.6 C) (Oral)  Resp 16  SpO2 98% Physical Exam  Constitutional: She is oriented to person, place, and time. She appears well-developed and well-nourished. No distress.  HENT:  Head: Normocephalic and atraumatic.  Eyes: Conjunctivae and EOM are normal.  Neck: Normal range of motion.  Cardiovascular: Normal rate, regular rhythm, normal heart sounds and intact distal pulses.  Exam reveals no gallop and no friction rub.   No murmur heard. Pulmonary/Chest: Effort normal and breath sounds normal. No respiratory  distress. She has no wheezes. She has no rales.  Abdominal: Soft. She exhibits no distension. There is no tenderness. There is no guarding.  No significant tenderness  Musculoskeletal: She exhibits no edema or tenderness.  Neurological: She is alert and oriented to person, place, and time.  Skin: Skin is warm and dry. No rash noted. She is not diaphoretic. No erythema.  Nursing note and vitals reviewed.   ED Course  Procedures (including critical care time) Labs Review Labs Reviewed  LIPASE, BLOOD - Abnormal; Notable for the following:    Lipase 21 (*)    All other components within normal limits  COMPREHENSIVE  METABOLIC PANEL - Abnormal; Notable for the following:    Glucose, Bld 101 (*)    All other components within normal limits  CBC  URINALYSIS, ROUTINE W REFLEX MICROSCOPIC (NOT AT [facility])    Imaging Review No results found. I have personally reviewed and evaluated these images and lab results as part of my medical decision-making.   EKG Interpretation None      MDM   Final diagnoses:  None   79yo female with history of hypothyroidism, hypertension,CAD, DVT, recent evaluation for dilated appendix presents with concern for nausea, vomiting and abdominal pain.  Abdominal pain is unchanged from pain she has had on several recent evaluation over the last 3 weeks, and her exam is benign.  Recent CT scans have shown dilated appendix without other abnormalities and she has had eval with GI and general surgery at [facility] who agree that symptoms are not consistent with appendicitis.  Given unchanged pain, benign exam, presence of flatus, doubt appendicitis, AAA, cholecystitis, diverticulitis, or obstruction. Labs show no signs of significant dehydration, no sign pancreatitis, no sign hepatitis.  Given n/v obtained urine sample to eval for uti and showed no sign of this.  Wrote rx for zofran for symptoms and recommend continued outpt evaluation by GI, general surgery, pcp,  and grief counselor (regarding husband's passing) Patient discharged in stable condition with understanding of reasons to return.      Gareth Morgan, MD 03/25/15 [phone]

## 2015-03-24 NOTE — ED Notes (Signed)
MD at the bedside  

## 2015-03-27 ENCOUNTER — Telehealth: Payer: Self-pay

## 2015-03-27 NOTE — Telephone Encounter (Signed)
-----   Message from Willia Craze, NP sent at 03/24/2015  3:48 PM EDT ----- Barbera Setters, Would you mind touching base with this patient? Her records from outside hospital were reviewed. I see you has gone back to ED for RLQ pain. ED referred her for surgical evaluation. Can you see how she feels and if she has seen Dr. Redmond Pulling yet.  Thanks

## 2015-03-27 NOTE — Telephone Encounter (Signed)
Patient has an appt with the Surgeon Dr. Ninfa Linden from [place] on Tuesday of next week.  She is still constipated and nauseated.

## 2015-04-01 ENCOUNTER — Other Ambulatory Visit: Payer: Self-pay | Admitting: Surgery

## 2015-04-10 ENCOUNTER — Encounter (HOSPITAL_COMMUNITY): Payer: Self-pay | Admitting: *Deleted

## 2015-04-10 MED ORDER — CEFAZOLIN SODIUM-DEXTROSE 2-3 GM-% IV SOLR
2.0000 g | INTRAVENOUS | Status: AC
  Administered 2015-04-11: 2 g via INTRAVENOUS
  Filled 2015-04-10: qty 50

## 2015-04-10 NOTE — H&P (Signed)
Rachel Conner. Rachel Conner  Location: [facility] Surgery Patient #: [id number] DOB: October 21, 1935 Widowed / Language: Cleophus Molt / Race: White Female  History of Present Illness (Douglas A. Ninfa Linden MD; The patient is a 79 year old female who presents with abdominal pain. This is a pleasant female that I have performed a laparoscopic cholecystectomy on in the past. For  Many weeks, she is been having intermittent nausea with vomiting, diarrhea, and mild right lower quadrant abdominal discomfort. She had a CAT scan of the abdomen showing a dilated appendix with no periappendiceal inflammation and she had a normal white blood count. Subsequent to this, she had a CAT scan of the abdomen and pelvis and was seen at [facility] with again a dilated appendix with no evidence of appendicitis. The pain is mild and described as mildly sharp and intermittent. She denies fevers. She is otherwise without complaints. She does have significant chronic medical issues and is on chronic blood thinning medication   Other Problems Elbert Ewings, CMA;  Anxiety Disorder Arthritis Depression High blood pressure Myocardial infarction Thyroid Disease Vascular Disease  Past Surgical History Elbert Ewings, CMA Colon Polyp Removal - Colonoscopy Colon Polyp Removal - Open Spinal Surgery Midback Thyroid Surgery  Diagnostic Studies History Elbert Ewings, CM Colonoscopy 5-10 years ago Mammogram 1-3 years ago Pap Smear 1-5 years ago  Allergies Elbert Ewings, CMA;  Niaspan *ANTIHYPERLIPIDEMICS* Nitroglycerin *ANTIANGINAL AGENTS* Myrbetriq *URINARY ANTISPASMODICS*  Medication History Elbert Ewings, CMA;  Xanax (0.25MG  Tablet, Oral) Active. Norvasc (10MG  Tablet, Oral) Active. Aspirin (81MG  Tablet, Oral) Active. CeleXA (10MG  Tablet, Oral) Active. NexIUM (20MG  Capsule DR, Oral) Active. Tricor (145MG  Tablet, Oral) Active. Atarax (10MG  Tablet, Oral) Active. Synthroid (100MCG Tablet, Oral)  Active. Melatonin (1MG  Tablet, Oral) Active. Zofran (8MG  Tablet, Oral) Active. Xarelto (20MG  Tablet, Oral) Active. Crestor (20MG  Tablet, Oral) Active. Desyrel (50MG  Tablet, Oral) Active. Vitamin C (500MG  Tablet, Oral) Active. Vitamin E (400UNIT Tablet, Oral) Active. Medications Reconciled  Social History Elbert Ewings, [place];  Alcohol use Remotely quit alcohol use. No caffeine use No drug use Tobacco use Former smoker.  Family History Elbert Ewings, [place];  Arthritis Father.  Pregnancy / Birth History Elbert Ewings, [place]; Age at menarche 50 years. Maternal age 78-25 Para 3  Review of Systems Elbert Ewings CMA; Skin Present- Dryness. Not Present- Change in Wart/Mole, Hives, Jaundice, New Lesions, Non-Healing Wounds, Rash and Ulcer. HEENT Present- Hearing Loss. Not Present- Earache, Hoarseness, Nose Bleed, Oral Ulcers, Ringing in the Ears, Seasonal Allergies, Sinus Pain, Sore Throat, Visual Disturbances, Wears glasses/contact lenses and Yellow Eyes. Gastrointestinal Present- Abdominal Pain, Change in Bowel Habits, Chronic diarrhea, Indigestion, Nausea, Rectal Pain and Vomiting. Not Present- Bloating, Bloody Stool, Constipation, Difficulty Swallowing, Excessive gas, Gets full quickly at meals and Hemorrhoids. Female Genitourinary Present- Frequency, Nocturia and Urgency. Not Present- Painful Urination and Pelvic Pain. Musculoskeletal Present- Joint Pain. Not Present- Back Pain, Joint Stiffness, Muscle Pain, Muscle Weakness and Swelling of Extremities. Neurological Present- Trouble walking. Not Present- Decreased Memory, Fainting, Headaches, Numbness, Seizures, Tingling, Tremor and Weakness. Psychiatric Present- Anxiety. Not Present- Bipolar, Change in Sleep Pattern, Depression, Fearful and Frequent crying. Endocrine Present- Heat Intolerance. Not Present- Cold Intolerance, Excessive Hunger, Hair Changes, Hot flashes and New Diabetes.   Vitals Elbert Ewings CMA;  Weight: 208  lb Height: 65in Body Surface Area: 2.08 m Body Mass Index: 34.61 kg/m Temp.: 97.53F(Oral)  Pulse: 60 (Regular)  BP: 126/68 (Sitting, Left Arm, Standard)    Physical Exam (Douglas A. Ninfa Linden MD;  General Mental Status-Alert. General Appearance-Consistent with stated  age. Hydration-Well hydrated. Voice-Normal.  Head and Neck Head-normocephalic, atraumatic with no lesions or palpable masses. Trachea-midline. Thyroid Gland Characteristics - normal size and consistency.  Eye Eyeball - Bilateral-Extraocular movements intact. Sclera/Conjunctiva - Bilateral-No scleral icterus.  Chest and Lung Exam Chest and lung exam reveals -quiet, even and easy respiratory effort with no use of accessory muscles and on auscultation, normal breath sounds, no adventitious sounds and normal vocal resonance. Inspection Chest Wall - Normal. Back - normal.  Breast Breast - Left-Symmetric, Non Tender, No Biopsy scars, no Dimpling, No Inflammation, No Lumpectomy scars, No Mastectomy scars, No Peau d' Orange. Breast - Right-Symmetric, Non Tender, No Biopsy scars, no Dimpling, No Inflammation, No Lumpectomy scars, No Mastectomy scars, No Peau d' Orange. Breast Lump-No Palpable Breast Mass.  Cardiovascular Cardiovascular examination reveals -normal heart sounds, regular rate and rhythm with no murmurs and normal pedal pulses bilaterally.  Abdomen Inspection Inspection of the abdomen reveals - No Hernias. Skin - Scar - no surgical scars. Palpation/Percussion Palpation and Percussion of the abdomen reveal - Soft. Tenderness - Right Lower Quadrant. Note: There is minimal tenderness in the right lower quadrant. There is minimal guarding. The rest of the abdomen is soft and nontender. Auscultation Auscultation of the abdomen reveals - Bowel sounds normal.  Neurologic Neurologic evaluation reveals -alert and oriented x 3 with no impairment of recent or remote  memory. Mental Status-Normal.  Musculoskeletal Normal Exam - Left-Upper Extremity Strength Normal and Lower Extremity Strength Normal. Normal Exam - Right-Upper Extremity Strength Normal and Lower Extremity Strength Normal.  Lymphatic Head & Neck  General Head & Neck Lymphatics: Bilateral - Description - Normal. Axillary  General Axillary Region: Bilateral - Description - Normal. Tenderness - Non Tender. Femoral & Inguinal  Generalized Femoral & Inguinal Lymphatics: Bilateral - Description - Normal. Tenderness - Non Tender.    Assessment & Plan (Douglas A. Ninfa Linden MD;   CHRONIC APPENDICITIS ([id number])  Impression: This may very well represent a chronic appendicitis. I cannot rule out a viral illness. She has had several CT scans which are otherwise unremarkable and an otherwise negative GI workup.  She failed to improve with oral antibiotics.  She is convinced her symptoms are from her appendix.  I can not totally rule out a tumor of the appendix.  After several long discussions, she wishes to proceed with an appendectomy.  I discussed the risks of the surgery in detail.  These include but are not limited to bleeding, infection, injury to surrounding structures, the need to convert to an open procedure, the chance this may not improve her symptoms, post op recovery, DVT, cardiopulmonary issues, etc.  Will proceed with a laparoscopic appendectomy.

## 2015-04-10 NOTE — Progress Notes (Signed)
Mrs Rachel Conner states that she stopped Xarelto on Saturday 30.   Mrs Rachel Conner is bring her medication list in am, I don't take  all the medications anymore.  Patient statess that she takes Amlodipine in the evenings and Hyaazar in am, I instructed her to not take Hyzaar in am.

## 2015-04-11 ENCOUNTER — Inpatient Hospital Stay (HOSPITAL_COMMUNITY)
Admission: AD | Admit: 2015-04-11 | Discharge: 2015-04-15 | DRG: 342 | Disposition: A | Discharge status: Home / Self Care | Payer: Medicare Other | Source: Ambulatory Visit | Attending: Surgery | Admitting: Surgery

## 2015-04-11 ENCOUNTER — Encounter (HOSPITAL_COMMUNITY)
Admission: AD | Disposition: A | Discharge status: Home / Self Care | Payer: Self-pay | Source: Ambulatory Visit | Attending: Surgery

## 2015-04-11 ENCOUNTER — Ambulatory Visit (HOSPITAL_COMMUNITY): Payer: Medicare Other | Admitting: Anesthesiology

## 2015-04-11 ENCOUNTER — Encounter (HOSPITAL_COMMUNITY): Payer: Self-pay | Admitting: *Deleted

## 2015-04-11 DIAGNOSIS — Z5331 Laparoscopic surgical procedure converted to open procedure: Secondary | ICD-10-CM

## 2015-04-11 DIAGNOSIS — Z7982 Long term (current) use of aspirin: Secondary | ICD-10-CM | POA: Diagnosis not present

## 2015-04-11 DIAGNOSIS — Z87891 Personal history of nicotine dependence: Secondary | ICD-10-CM

## 2015-04-11 DIAGNOSIS — Z6834 Body mass index (BMI) 34.0-34.9, adult: Secondary | ICD-10-CM | POA: Diagnosis not present

## 2015-04-11 DIAGNOSIS — F418 Other specified anxiety disorders: Secondary | ICD-10-CM | POA: Diagnosis present

## 2015-04-11 DIAGNOSIS — M1990 Unspecified osteoarthritis, unspecified site: Secondary | ICD-10-CM | POA: Diagnosis present

## 2015-04-11 DIAGNOSIS — K36 Other appendicitis: Secondary | ICD-10-CM | POA: Diagnosis present

## 2015-04-11 DIAGNOSIS — D62 Acute posthemorrhagic anemia: Secondary | ICD-10-CM | POA: Diagnosis not present

## 2015-04-11 DIAGNOSIS — K219 Gastro-esophageal reflux disease without esophagitis: Secondary | ICD-10-CM | POA: Diagnosis present

## 2015-04-11 DIAGNOSIS — Z951 Presence of aortocoronary bypass graft: Secondary | ICD-10-CM

## 2015-04-11 DIAGNOSIS — Z7901 Long term (current) use of anticoagulants: Secondary | ICD-10-CM

## 2015-04-11 DIAGNOSIS — I10 Essential (primary) hypertension: Secondary | ICD-10-CM | POA: Diagnosis present

## 2015-04-11 DIAGNOSIS — E669 Obesity, unspecified: Secondary | ICD-10-CM | POA: Diagnosis present

## 2015-04-11 DIAGNOSIS — I252 Old myocardial infarction: Secondary | ICD-10-CM

## 2015-04-11 DIAGNOSIS — F329 Major depressive disorder, single episode, unspecified: Secondary | ICD-10-CM | POA: Diagnosis present

## 2015-04-11 DIAGNOSIS — E039 Hypothyroidism, unspecified: Secondary | ICD-10-CM | POA: Diagnosis present

## 2015-04-11 DIAGNOSIS — I739 Peripheral vascular disease, unspecified: Secondary | ICD-10-CM | POA: Diagnosis present

## 2015-04-11 DIAGNOSIS — R1031 Right lower quadrant pain: Secondary | ICD-10-CM | POA: Diagnosis present

## 2015-04-11 HISTORY — PX: LAPAROSCOPIC APPENDECTOMY: SHX408

## 2015-04-11 HISTORY — DX: Gastro-esophageal reflux disease without esophagitis: K21.9

## 2015-04-11 LAB — BASIC METABOLIC PANEL
ANION GAP: 11 (ref 5–15)
BUN: 12 mg/dL (ref 6–20)
CALCIUM: 9.3 mg/dL (ref 8.9–10.3)
CHLORIDE: 102 mmol/L (ref 101–111)
CO2: 27 mmol/L (ref 22–32)
Creatinine, Ser: 0.88 mg/dL (ref 0.44–1.00)
GFR calc non Af Amer: >60 mL/min (ref >60)
GLUCOSE: 111 mg/dL — AB (ref 65–99)
POTASSIUM: 3.3 mmol/L — AB (ref 3.5–5.1)
Sodium: 140 mmol/L (ref 135–145)

## 2015-04-11 LAB — CBC
HEMATOCRIT: 39 % (ref 36.0–46.0)
HEMOGLOBIN: 11.9 g/dL — AB (ref 12.0–15.0)
MCH: 26.1 pg (ref 26.0–34.0)
MCHC: 30.5 g/dL (ref 30.0–36.0)
MCV: 85.5 fL (ref 78.0–100.0)
Platelets: 277 10*3/uL (ref 150–400)
RBC: 4.56 MIL/uL (ref 3.87–5.11)
RDW: 15.1 % (ref 11.5–15.5)
WBC: 8.5 10*3/uL (ref 4.0–10.5)

## 2015-04-11 SURGERY — APPENDECTOMY, LAPAROSCOPIC
Anesthesia: General | Site: Abdomen

## 2015-04-11 MED ORDER — DEXAMETHASONE SODIUM PHOSPHATE 10 MG/ML IJ SOLN
INTRAMUSCULAR | Status: AC
  Filled 2015-04-11: qty 1

## 2015-04-11 MED ORDER — FENTANYL CITRATE (PF) 250 MCG/5ML IJ SOLN
INTRAMUSCULAR | Status: AC
  Filled 2015-04-11: qty 5

## 2015-04-11 MED ORDER — FENTANYL CITRATE (PF) 100 MCG/2ML IJ SOLN
INTRAMUSCULAR | Status: DC | PRN
  Administered 2015-04-11 (×2): 50 ug via INTRAVENOUS
  Administered 2015-04-11: 100 ug via INTRAVENOUS
  Administered 2015-04-11 (×4): 50 ug via INTRAVENOUS

## 2015-04-11 MED ORDER — ACETAMINOPHEN 650 MG RE SUPP: 650.0000 mg | Freq: Four times a day (QID) | RECTAL | Status: DC | PRN

## 2015-04-11 MED ORDER — PHENYLEPHRINE HCL 10 MG/ML IJ SOLN
INTRAMUSCULAR | Status: DC | PRN
  Administered 2015-04-11 (×2): 40 ug via INTRAVENOUS

## 2015-04-11 MED ORDER — PROMETHAZINE HCL 25 MG/ML IJ SOLN
6.2500 mg | INTRAMUSCULAR | Status: DC | PRN
  Administered 2015-04-11: 6.25 mg via INTRAVENOUS

## 2015-04-11 MED ORDER — HYDROCHLOROTHIAZIDE 12.5 MG PO CAPS
12.5000 mg | ORAL_CAPSULE | Freq: Every day | ORAL | Status: DC
  Administered 2015-04-13 – 2015-04-15 (×3): 12.5 mg via ORAL
  Filled 2015-04-11 (×3): qty 1

## 2015-04-11 MED ORDER — HYDROMORPHONE HCL 1 MG/ML IJ SOLN
INTRAMUSCULAR | Status: AC
  Filled 2015-04-11: qty 1

## 2015-04-11 MED ORDER — BUPIVACAINE-EPINEPHRINE (PF) 0.5% -1:200000 IJ SOLN
INTRAMUSCULAR | Status: AC
  Filled 2015-04-11: qty 30

## 2015-04-11 MED ORDER — MORPHINE SULFATE (PF) 2 MG/ML IV SOLN
1.0000 mg | INTRAVENOUS | Status: DC | PRN
  Administered 2015-04-11 – 2015-04-12 (×3): 4 mg via INTRAVENOUS
  Filled 2015-04-11 (×3): qty 2

## 2015-04-11 MED ORDER — AMLODIPINE BESYLATE 10 MG PO TABS
10.0000 mg | ORAL_TABLET | Freq: Every day | ORAL | Status: DC
  Administered 2015-04-12 – 2015-04-14 (×3): 10 mg via ORAL
  Filled 2015-04-11 (×5): qty 1

## 2015-04-11 MED ORDER — GLYCOPYRROLATE 0.2 MG/ML IJ SOLN
INTRAMUSCULAR | Status: AC
  Filled 2015-04-11: qty 4

## 2015-04-11 MED ORDER — LIDOCAINE HCL (CARDIAC) 20 MG/ML IV SOLN
INTRAVENOUS | Status: DC | PRN
  Administered 2015-04-11: 100 mg via INTRAVENOUS

## 2015-04-11 MED ORDER — 0.9 % SODIUM CHLORIDE (POUR BTL) OPTIME
TOPICAL | Status: DC | PRN
  Administered 2015-04-11: 2000 mL
  Administered 2015-04-11: 1000 mL

## 2015-04-11 MED ORDER — MIDAZOLAM HCL 2 MG/2ML IJ SOLN
INTRAMUSCULAR | Status: AC
  Filled 2015-04-11: qty 4

## 2015-04-11 MED ORDER — PROMETHAZINE HCL 25 MG/ML IJ SOLN
INTRAMUSCULAR | Status: AC
  Filled 2015-04-11: qty 1

## 2015-04-11 MED ORDER — MIDAZOLAM HCL 5 MG/5ML IJ SOLN
INTRAMUSCULAR | Status: DC | PRN
  Administered 2015-04-11: 1 mg via INTRAVENOUS

## 2015-04-11 MED ORDER — PROPOFOL 10 MG/ML IV BOLUS
INTRAVENOUS | Status: DC | PRN
  Administered 2015-04-11: 50 mg via INTRAVENOUS
  Administered 2015-04-11: 100 mg via INTRAVENOUS

## 2015-04-11 MED ORDER — ROCURONIUM BROMIDE 100 MG/10ML IV SOLN
INTRAVENOUS | Status: DC | PRN
  Administered 2015-04-11: 10 mg via INTRAVENOUS
  Administered 2015-04-11: 20 mg via INTRAVENOUS
  Administered 2015-04-11: 10 mg via INTRAVENOUS

## 2015-04-11 MED ORDER — LACTATED RINGERS IV SOLN
INTRAVENOUS | Status: DC
  Administered 2015-04-11 (×2): via INTRAVENOUS

## 2015-04-11 MED ORDER — HYDROMORPHONE HCL 1 MG/ML IJ SOLN
0.2500 mg | INTRAMUSCULAR | Status: DC | PRN
  Administered 2015-04-11 (×3): 0.5 mg via INTRAVENOUS

## 2015-04-11 MED ORDER — GLYCOPYRROLATE 0.2 MG/ML IJ SOLN
INTRAMUSCULAR | Status: DC | PRN
  Administered 2015-04-11: .8 mg via INTRAVENOUS
  Administered 2015-04-11 (×2): 0.1 mg via INTRAVENOUS

## 2015-04-11 MED ORDER — LOSARTAN POTASSIUM-HCTZ 50-12.5 MG PO TABS: 1.0000 | ORAL_TABLET | Freq: Every day | ORAL | Status: DC

## 2015-04-11 MED ORDER — LOSARTAN POTASSIUM 50 MG PO TABS
50.0000 mg | ORAL_TABLET | Freq: Every day | ORAL | Status: DC
  Administered 2015-04-13 – 2015-04-15 (×3): 50 mg via ORAL
  Filled 2015-04-11 (×3): qty 1

## 2015-04-11 MED ORDER — ONDANSETRON HCL 4 MG/2ML IJ SOLN
4.0000 mg | Freq: Four times a day (QID) | INTRAMUSCULAR | Status: DC | PRN
  Administered 2015-04-11 – 2015-04-12 (×2): 4 mg via INTRAVENOUS
  Filled 2015-04-11 (×2): qty 2

## 2015-04-11 MED ORDER — OXYCODONE HCL 5 MG PO TABS
5.0000 mg | ORAL_TABLET | ORAL | Status: DC | PRN
  Administered 2015-04-12 (×2): 10 mg via ORAL
  Administered 2015-04-12 (×2): 5 mg via ORAL
  Administered 2015-04-13: 10 mg via ORAL
  Filled 2015-04-11 (×2): qty 2
  Filled 2015-04-11 (×2): qty 1
  Filled 2015-04-11: qty 2

## 2015-04-11 MED ORDER — SODIUM CHLORIDE 0.9 % IR SOLN
Status: DC | PRN
  Administered 2015-04-11: 1000 mL

## 2015-04-11 MED ORDER — ACETAMINOPHEN 325 MG PO TABS: 650.0000 mg | ORAL_TABLET | Freq: Four times a day (QID) | ORAL | Status: DC | PRN

## 2015-04-11 MED ORDER — NEOSTIGMINE METHYLSULFATE 10 MG/10ML IV SOLN
INTRAVENOUS | Status: AC
  Filled 2015-04-11: qty 1

## 2015-04-11 MED ORDER — EPHEDRINE SULFATE 50 MG/ML IJ SOLN
INTRAMUSCULAR | Status: DC | PRN
  Administered 2015-04-11: 10 mg via INTRAVENOUS

## 2015-04-11 MED ORDER — MEPERIDINE HCL 25 MG/ML IJ SOLN: 6.2500 mg | INTRAMUSCULAR | Status: DC | PRN

## 2015-04-11 MED ORDER — NEOSTIGMINE METHYLSULFATE 10 MG/10ML IV SOLN
INTRAVENOUS | Status: DC | PRN
  Administered 2015-04-11: 4 mg via INTRAVENOUS

## 2015-04-11 MED ORDER — ROCURONIUM BROMIDE 50 MG/5ML IV SOLN
INTRAVENOUS | Status: AC
  Filled 2015-04-11: qty 1

## 2015-04-11 MED ORDER — ENOXAPARIN SODIUM 40 MG/0.4ML ~~LOC~~ SOLN
40.0000 mg | SUBCUTANEOUS | Status: DC
  Administered 2015-04-12 – 2015-04-13 (×2): 40 mg via SUBCUTANEOUS
  Filled 2015-04-11 (×2): qty 0.4

## 2015-04-11 MED ORDER — PANTOPRAZOLE SODIUM 40 MG IV SOLR
40.0000 mg | Freq: Every day | INTRAVENOUS | Status: DC
  Administered 2015-04-11 – 2015-04-12 (×2): 40 mg via INTRAVENOUS
  Filled 2015-04-11 (×2): qty 40

## 2015-04-11 MED ORDER — SUCCINYLCHOLINE CHLORIDE 200 MG/10ML IV SOSY
PREFILLED_SYRINGE | INTRAVENOUS | Status: DC | PRN
  Administered 2015-04-11: 120 mg via INTRAVENOUS

## 2015-04-11 MED ORDER — LIDOCAINE HCL (CARDIAC) 20 MG/ML IV SOLN
INTRAVENOUS | Status: AC
  Filled 2015-04-11: qty 5

## 2015-04-11 MED ORDER — POTASSIUM CHLORIDE IN NACL 20-0.9 MEQ/L-% IV SOLN
INTRAVENOUS | Status: DC
  Administered 2015-04-11 – 2015-04-12 (×3): via INTRAVENOUS
  Filled 2015-04-11 (×3): qty 1000

## 2015-04-11 MED ORDER — ONDANSETRON HCL 4 MG/2ML IJ SOLN
INTRAMUSCULAR | Status: AC
  Filled 2015-04-11: qty 2

## 2015-04-11 MED ORDER — DEXAMETHASONE SODIUM PHOSPHATE 10 MG/ML IJ SOLN
INTRAMUSCULAR | Status: DC | PRN
  Administered 2015-04-11: 4 mg via INTRAVENOUS

## 2015-04-11 MED ORDER — ONDANSETRON HCL 4 MG/2ML IJ SOLN
INTRAMUSCULAR | Status: DC | PRN
  Administered 2015-04-11: 4 mg via INTRAVENOUS

## 2015-04-11 MED ORDER — PROPOFOL 10 MG/ML IV BOLUS
INTRAVENOUS | Status: AC
  Filled 2015-04-11: qty 20

## 2015-04-11 MED ORDER — ONDANSETRON 4 MG PO TBDP
4.0000 mg | ORAL_TABLET | Freq: Four times a day (QID) | ORAL | Status: DC | PRN
  Administered 2015-04-13 – 2015-04-15 (×3): 4 mg via ORAL
  Filled 2015-04-11 (×3): qty 1

## 2015-04-11 SURGICAL SUPPLY — 62 items
APPLIER CLIP 5 13 M/L LIGAMAX5 (MISCELLANEOUS)
APPLIER CLIP ROT 10 11.4 M/L (STAPLE)
APR CLP MED LRG 11.4X10 (STAPLE)
APR CLP MED LRG 5 ANG JAW (MISCELLANEOUS)
BAG SPEC RTRVL LRG 6X4 10 (ENDOMECHANICALS) ×1
BLADE SURG 10 STRL SS (BLADE) ×2 IMPLANT
CANISTER SUCTION 2500CC (MISCELLANEOUS) ×3 IMPLANT
CHLORAPREP W/TINT 26ML (MISCELLANEOUS) ×3 IMPLANT
CLIP APPLIE 5 13 M/L LIGAMAX5 (MISCELLANEOUS) IMPLANT
CLIP APPLIE ROT 10 11.4 M/L (STAPLE) IMPLANT
CONT SPEC 4OZ CLIKSEAL STRL BL (MISCELLANEOUS) ×2 IMPLANT
COVER SURGICAL LIGHT HANDLE (MISCELLANEOUS) ×3 IMPLANT
CUTTER LINEAR ENDO 35 ART FLEX (STAPLE) IMPLANT
ELECT CAUTERY BLADE 6.4 (BLADE) ×2 IMPLANT
ELECT REM PT RETURN 9FT ADLT (ELECTROSURGICAL) ×3
ELECTRODE REM PT RTRN 9FT ADLT (ELECTROSURGICAL) ×1 IMPLANT
ENDOLOOP SUT PDS II  0 18 (SUTURE)
ENDOLOOP SUT PDS II 0 18 (SUTURE) IMPLANT
GAUZE SPONGE 4X4 12PLY STRL (GAUZE/BANDAGES/DRESSINGS) ×4 IMPLANT
GLOVE BIO SURGEON STRL SZ7 (GLOVE) ×2 IMPLANT
GLOVE BIO SURGEON STRL SZ7.5 (GLOVE) ×2 IMPLANT
GLOVE BIOGEL PI IND STRL 6.5 (GLOVE) IMPLANT
GLOVE BIOGEL PI IND STRL 7.0 (GLOVE) IMPLANT
GLOVE BIOGEL PI IND STRL 7.5 (GLOVE) IMPLANT
GLOVE BIOGEL PI INDICATOR 6.5 (GLOVE) ×2
GLOVE BIOGEL PI INDICATOR 7.0 (GLOVE) ×2
GLOVE BIOGEL PI INDICATOR 7.5 (GLOVE) ×2
GLOVE ECLIPSE 6.5 STRL STRAW (GLOVE) ×2 IMPLANT
GLOVE SURG SIGNA 7.5 PF LTX (GLOVE) ×3 IMPLANT
GOWN STRL REUS W/ TWL LRG LVL3 (GOWN DISPOSABLE) ×2 IMPLANT
GOWN STRL REUS W/ TWL XL LVL3 (GOWN DISPOSABLE) ×1 IMPLANT
GOWN STRL REUS W/TWL LRG LVL3 (GOWN DISPOSABLE) ×6
GOWN STRL REUS W/TWL XL LVL3 (GOWN DISPOSABLE) ×3
KIT BASIN OR (CUSTOM PROCEDURE TRAY) ×3 IMPLANT
KIT ROOM TURNOVER OR (KITS) ×3 IMPLANT
LIQUID BAND (GAUZE/BANDAGES/DRESSINGS) ×3 IMPLANT
NS IRRIG 1000ML POUR BTL (IV SOLUTION) ×3 IMPLANT
PAD ARMBOARD 7.5X6 YLW CONV (MISCELLANEOUS) ×6 IMPLANT
PENCIL BUTTON HOLSTER BLD 10FT (ELECTRODE) ×2 IMPLANT
POUCH SPECIMEN RETRIEVAL 10MM (ENDOMECHANICALS) ×3 IMPLANT
RELOAD CUTTER ETS 35MM STAND (ENDOMECHANICALS) IMPLANT
SCALPEL HARMONIC ACE (MISCELLANEOUS) ×3 IMPLANT
SCISSORS LAP 5X35 DISP (ENDOMECHANICALS) ×2 IMPLANT
SET IRRIG TUBING LAPAROSCOPIC (IRRIGATION / IRRIGATOR) ×3 IMPLANT
SLEEVE ENDOPATH XCEL 5M (ENDOMECHANICALS) ×3 IMPLANT
STAPLER PROXIMATE 75MM BLUE (STAPLE) ×2 IMPLANT
STAPLER VISISTAT 35W (STAPLE) ×2 IMPLANT
SUT MON AB 4-0 PC3 18 (SUTURE) ×3 IMPLANT
SUT PDS AB 1 TP1 96 (SUTURE) ×4 IMPLANT
SUT SILK 3 0 SH CR/8 (SUTURE) ×2 IMPLANT
SUT VICRYL 0 UR6 27IN ABS (SUTURE) ×2 IMPLANT
SYR BULB IRRIGATION 50ML (SYRINGE) ×2 IMPLANT
TAPE CLOTH SOFT 2X10 (GAUZE/BANDAGES/DRESSINGS) ×2 IMPLANT
TOWEL OR 17X24 6PK STRL BLUE (TOWEL DISPOSABLE) ×3 IMPLANT
TOWEL OR 17X26 10 PK STRL BLUE (TOWEL DISPOSABLE) ×3 IMPLANT
TRAY LAPAROSCOPIC MC (CUSTOM PROCEDURE TRAY) ×3 IMPLANT
TROCAR XCEL BLUNT TIP 100MML (ENDOMECHANICALS) ×3 IMPLANT
TROCAR XCEL NON-BLD 5MMX100MML (ENDOMECHANICALS) ×3 IMPLANT
TUBE CONNECTING 12'X1/4 (SUCTIONS) ×1
TUBE CONNECTING 12X1/4 (SUCTIONS) ×1 IMPLANT
TUBING INSUFFLATION (TUBING) ×3 IMPLANT
YANKAUER SUCT BULB TIP NO VENT (SUCTIONS) ×2 IMPLANT

## 2015-04-11 NOTE — Anesthesia Procedure Notes (Signed)
Procedure Name: Intubation Date/Time: 04/11/2015 3:37 PM Performed by: Ignacia Bayley Pre-anesthesia Checklist: Patient identified, Emergency Drugs available, Suction available and Patient being monitored Patient Re-evaluated:Patient Re-evaluated prior to inductionOxygen Delivery Method: Circle system utilized Preoxygenation: Pre-oxygenation with 100% oxygen Intubation Type: IV induction Ventilation: Mask ventilation without difficulty Laryngoscope Size: Miller and 2 Grade View: Grade II Tube type: Oral Tube size: 7.0 mm Number of attempts: 1 Airway Equipment and Method: Stylet Placement Confirmation: ETT inserted through vocal cords under direct vision,  positive ETCO2,  CO2 detector and breath sounds checked- equal and bilateral Secured at: 20 cm Tube secured with: Tape Dental Injury: Teeth and Oropharynx as per pre-operative assessment

## 2015-04-11 NOTE — Anesthesia Preprocedure Evaluation (Addendum)
Anesthesia Evaluation  Patient identified by MRN, date of birth, ID band Patient awake    Reviewed: Allergy & Precautions, NPO status , Patient's Chart, lab work & pertinent test results  Airway Mallampati: IV  TM Distance: >3 FB Neck ROM: Full    Dental  (+) Edentulous Upper, Poor Dentition   Pulmonary neg pulmonary ROS, former smoker,    Pulmonary exam normal breath sounds clear to auscultation       Cardiovascular hypertension, + CAD, + Past MI, + Cardiac Stents, + CABG, + Peripheral Vascular Disease and +CHF  Normal cardiovascular exam Rhythm:Regular Rate:Normal  MPS 09/2014 Normal myovue study with no evidence of ischemia or infarction   Neuro/Psych PSYCHIATRIC DISORDERS Anxiety Depression    GI/Hepatic Neg liver ROS, hiatal hernia, GERD  Medicated,  Endo/Other  Hypothyroidism   Renal/GU negative Renal ROS     Musculoskeletal  (+) Arthritis ,   Abdominal (+) + obese,   Peds  Hematology  (+) anemia ,   Anesthesia Other Findings   Reproductive/Obstetrics negative OB ROS                           Anesthesia Physical Anesthesia Plan  ASA: III  Anesthesia Plan: General   Post-op Pain Management:    Induction: Intravenous  Airway Management Planned: Oral ETT  Additional Equipment: None  Intra-op Plan:   Post-operative Plan: Extubation in OR  Informed Consent: I have reviewed the patients History and Physical, chart, labs and discussed the procedure including the risks, benefits and alternatives for the proposed anesthesia with the patient or authorized representative who has indicated his/her understanding and acceptance.   Dental advisory given  Plan Discussed with: CRNA and Anesthesiologist  Anesthesia Plan Comments:        Anesthesia Quick Evaluation

## 2015-04-11 NOTE — Op Note (Signed)
LAPAROSCOPIC APPENDECTOMY  Procedure Note  Rachel Conner 04/11/2015   Pre-op Diagnosis: Chronic Appendicitis     Post-op Diagnosis: same  Procedure(s): LAPAROSCOPIC CONVERTED TO OPEN APPENDECTOMY  Surgeon(s): Coralie Keens, MD  Anesthesia: General  Staff:  Circulator: Kara Mead, RN; Tereasa Coop, RN Scrub Person: Jesse Sans, CST; Elgie Congo, CST; Gladstone Lighter, RN Circulator Assistant: Gladstone Lighter, RN RN First Assistant: Quincy Carnes, RN  Estimated Blood Loss: less than 100 mL               Specimens: SENT TO PATH          Coralie Keens A   Date: 04/11/2015  Time: 5:13 PM

## 2015-04-11 NOTE — Transfer of Care (Signed)
Immediate Anesthesia Transfer of Care Note  Patient: Rachel Conner  Procedure(s) Performed: Procedure(s) with comments: LAPAROSCOPIC APPENDECTOMY (N/A) - Laparoscopic converted to open appendectomy   Patient Location: PACU  Anesthesia Type:General  Level of Consciousness: sedated  Airway & Oxygen Therapy: Patient Spontanous Breathing and Patient connected to nasal cannula oxygen  Post-op Assessment: Report given to RN and Post -op Vital signs reviewed and stable  Post vital signs: stable  Last Vitals:  Filed Vitals:   04/11/15 1304  BP: 137/50  Pulse: 78  Temp: 36.4 C  Resp: 16    Complications: No apparent anesthesia complications

## 2015-04-11 NOTE — Interval H&P Note (Signed)
History and Physical Interval Note:no change in H and P  04/11/2015 2:45 PM  Rachel Conner  has presented today for surgery, with the diagnosis of Chronic Appendicitis  The various methods of treatment have been discussed with the patient and family. After consideration of risks, benefits and other options for treatment, the patient has consented to  Procedure(s): LAPOROSCOPIC APPENDECTOMY (N/A) as a surgical intervention .  The patient's history has been reviewed, patient examined, no change in status, stable for surgery.  I have reviewed the patient's chart and labs.  Questions were answered to the patient's satisfaction.     Myya Meenach A

## 2015-04-11 NOTE — Anesthesia Postprocedure Evaluation (Signed)
  Anesthesia Post-op Note  Patient: Rachel Conner  Procedure(s) Performed: Procedure(s) (LRB): LAPAROSCOPIC APPENDECTOMY (N/A)  Patient Location: PACU  Anesthesia Type: General  Level of Consciousness: awake and alert   Airway and Oxygen Therapy: Patient Spontanous Breathing  Post-op Pain: mild  Post-op Assessment: Post-op Vital signs reviewed, Patient's Cardiovascular Status Stable, Respiratory Function Stable, Patent Airway and No signs of Nausea or vomiting  Last Vitals:  Filed Vitals:   04/11/15 1715  BP:   Pulse:   Temp: 36.4 C  Resp:     Post-op Vital Signs: stable   Complications: No apparent anesthesia complications

## 2015-04-12 LAB — BASIC METABOLIC PANEL
Anion gap: 8 (ref 5–15)
BUN: 14 mg/dL (ref 6–20)
CHLORIDE: 103 mmol/L (ref 101–111)
CO2: 27 mmol/L (ref 22–32)
Calcium: 8.6 mg/dL — ABNORMAL LOW (ref 8.9–10.3)
Creatinine, Ser: 0.86 mg/dL (ref 0.44–1.00)
GFR calc Af Amer: >60 mL/min (ref >60)
GFR calc non Af Amer: >60 mL/min (ref >60)
GLUCOSE: 141 mg/dL — AB (ref 65–99)
POTASSIUM: 4 mmol/L (ref 3.5–5.1)
Sodium: 138 mmol/L (ref 135–145)

## 2015-04-12 LAB — CBC
HEMATOCRIT: 31.8 % — AB (ref 36.0–46.0)
HEMOGLOBIN: 10.1 g/dL — AB (ref 12.0–15.0)
MCH: 27.3 pg (ref 26.0–34.0)
MCHC: 31.8 g/dL (ref 30.0–36.0)
MCV: 85.9 fL (ref 78.0–100.0)
PLATELETS: 262 10*3/uL (ref 150–400)
RBC: 3.7 MIL/uL — AB (ref 3.87–5.11)
RDW: 15.2 % (ref 11.5–15.5)
WBC: 13.9 10*3/uL — ABNORMAL HIGH (ref 4.0–10.5)

## 2015-04-12 MED ORDER — INFLUENZA VAC SPLIT QUAD 0.5 ML IM SUSY: 0.5000 mL | PREFILLED_SYRINGE | INTRAMUSCULAR | Status: DC | PRN

## 2015-04-12 MED ORDER — PNEUMOCOCCAL VAC POLYVALENT 25 MCG/0.5ML IJ INJ: 0.5000 mL | INJECTION | INTRAMUSCULAR | Status: DC | PRN

## 2015-04-12 NOTE — Op Note (Signed)
Rachel Conner, Rachel Conner              ACCOUNT NO.:  [id number]  MEDICAL RECORD NO.:  [id number]  LOCATION:  [id number]                        FACILITY:  [facility]  PHYSICIAN:  Coralie Keens, M.D. DATE OF BIRTH:  1936-02-15  DATE OF PROCEDURE:  05/11/2015 DATE OF DISCHARGE:                              OPERATIVE REPORT   PREOPERATIVE DIAGNOSIS:  Chronic appendicitis.  POSTOPERATIVE DIAGNOSIS:  Chronic appendicitis.  PROCEDURE:  Laparoscopic converted to open appendectomy.  SURGEON:  Coralie Keens, M.D.  ANESTHESIA:  General.  ESTIMATED BLOOD LOSS:  100 mL.  INDICATIONS:  This is a 79 year old female, who presented in August with abdominal pain, nausea, and vomiting.  She apparently had a CAT scan of abdomen and pelvis in [place] which showed a dilated appendix.  She was sent to [facility] in [place], but appendectomy was not performed. She since had followed up with her gastroenterologist.  At that point, she had no abdominal pain, but continued to have nausea, vomiting, and diarrhea.  She was then referred to Surgery.  I placed her on oral antibiotics.  This did not resolve her symptoms.  She had another CAT scan of the abdomen and pelvis which showed a fluid-filled appendix which is still slightly dilated, but no evidence of acute appendicitis. After a long discussion with the family, they were convinced that she indeed had appendicitis.  Because of her ongoing symptoms, a decision made to proceed to the operating room for laparoscopy.  FINDINGS:  The patient was found to have findings consistent with a chronic appendicitis with a retrocecal located appendix.  Most of the appendix was quite shriveled up with a very hard base coming off the cecum.  I had to convert to an open procedure in order to control this. No other intra-abdominal pathology was identified.  PROCEDURE IN DETAIL:  The patient was brought to the operating room, identified as Rachel Conner.  She was  placed supine on the operating table.  General anesthesia was induced.  Her abdomen was then prepped and draped in usual sterile fashion.  Using #15 blade, a small vertical incision was made below the umbilicus, this carried down to the fascia which was then opened with scalpel.  Hemostat was used to pass the peritoneal cavity under direct vision.  A 0 Vicryl pursestring suture then placed around the fascial opening.  The Hasson port was placed through the opening and insufflation of the abdomen was begun.  I placed a 5-mm trocar in the patient's right upper quadrant, another in the left lower quadrant, both under direct vision.  The patient's cecum was found to be adhered to the abdominal sidewall.  There were several adhesions in this area which I took down with the laparoscopic scissors.  I then was able to fully identify the cecum and terminal ileum.  The cecum was fairly fixed into the retroperitoneum, but I was able to mobilize along the white line of Toldt.  It became apparent that the patient probably had chronic appendicitis.  It was very woody back in the retroperitoneum with a lot of adhesions.  It was very difficult to dissect out.  I could finally elevate it, and there appeared to be a shrivelled up  inflamed appendix with a very firm stump that was right adjacent to the ileocecal valve.  Some bleeding in the retroperitoneum appeared to be controlled with Harmonic Scalpel.  I was able to mobilize the cecum well, but I could not confirmly come across the base of the shrivelled appendix, therefore decision was made to convert to an open procedure.  I removed all trocars and deflated the abdomen.  I then made the umbilical port site incision larger with a scalpel.  I took this down through the subcutaneous tissue and opened the fascia further.  I was able to elevate the cecum and terminal ileum up through this incision.  I was correct that the appendix was a shrivelled up stump  with a very firm base.  This was again adjacent to the ileocecal valve.  I was able to transect across the base and the tip of the cecum with a GIA 75 stapler taking care to avoid the terminal ileum.  This was then sent to Pathology for evaluation.  I then imbricated the mesentery and a loose purple-like appendage over the staple liner remaining cecum.  I then thoroughly irrigated the abdomen with normal saline.  Hemostasis appeared to be achieved.  I then closed the patient's midline fascia with a running looped #1 PDS suture.  The skin incisions were then irrigated and closed with skin staples.  The patient tolerated the procedure well.  All the counts were correct at the end of the procedure.  The patient was then extubated in the operating room and taken in stable condition to recovery room.     Coralie Keens, M.D.     DB/MEDQ  D:  04/11/2015  T:  04/12/2015  Job:  [id number]

## 2015-04-12 NOTE — Progress Notes (Signed)
1 Day Post-Op  Subjective: Feels ok Only mild incisional pain Minimal nausea  Objective: Vital signs in last 24 hours: Temp:  [97.6 F (36.4 C)-99.7 F (37.6 C)] 98.9 F (37.2 C) (10/01 0447) Pulse Rate:  [68-80] 76 (10/01 0447) Resp:  [12-24] 17 (10/01 0447) BP: (101-137)/(43-104) 128/56 mmHg (10/01 0447) SpO2:  [92 %-99 %] 96 % (10/01 0447) Weight:  [93.895 kg (207 lb)-99 kg (218 lb 4.1 oz)] 99 kg (218 lb 4.1 oz) (09/30 2005)    Intake/Output from previous day: 09/30 0701 - 10/01 0700 In: 2716.3 [P.O.:220; I.V.:2496.3] Out: 200 [Blood:200] Intake/Output this shift: Total I/O In: 916.3 [P.O.:220; I.V.:696.3] Out: -   Lungs clear Abdomen soft, dressings dry  Lab Results:   Recent Labs  04/11/15 1337 04/12/15 0523  WBC 8.5 13.9*  HGB 11.9* 10.1*  HCT 39.0 31.8*  PLT 277 262   BMET  Recent Labs  04/11/15 1337 04/12/15 0523  NA 140 138  K 3.3* 4.0  CL 102 103  CO2 27 27  GLUCOSE 111* 141*  BUN 12 14  CREATININE 0.88 0.86  CALCIUM 9.3 8.6*   PT/INR No results for input(s): LABPROT, INR in the last 72 hours. ABG No results for input(s): PHART, HCO3 in the last 72 hours.  Invalid input(s): PCO2, PO2  Studies/Results: No results found.  Anti-infectives: Anti-infectives    Start     Dose/Rate Route Frequency Ordered Stop   04/11/15 1430  ceFAZolin (ANCEF) IVPB 2 g/50 mL premix     2 g 100 mL/hr over 30 Minutes Intravenous To ShortStay Surgical 04/10/15 0912 04/11/15 1540      Assessment/Plan: s/p Procedure(s) with comments: LAPAROSCOPIC APPENDECTOMY (N/A) - Laparoscopic converted to open appendectomy   Keep on liquids Ambulate Pulmonary toilet  LOS: 1 day    Rachel Conner A 04/12/2015

## 2015-04-13 ENCOUNTER — Encounter (HOSPITAL_COMMUNITY): Payer: Self-pay | Admitting: Surgery

## 2015-04-13 LAB — BASIC METABOLIC PANEL
ANION GAP: 8 (ref 5–15)
BUN: 9 mg/dL (ref 6–20)
CALCIUM: 8.5 mg/dL — AB (ref 8.9–10.3)
CHLORIDE: 101 mmol/L (ref 101–111)
CO2: 29 mmol/L (ref 22–32)
Creatinine, Ser: 0.9 mg/dL (ref 0.44–1.00)
GFR calc Af Amer: >60 mL/min (ref >60)
GFR calc non Af Amer: >60 mL/min (ref >60)
GLUCOSE: 99 mg/dL (ref 65–99)
POTASSIUM: 3.4 mmol/L — AB (ref 3.5–5.1)
Sodium: 138 mmol/L (ref 135–145)

## 2015-04-13 LAB — CBC
HEMATOCRIT: 28.9 % — AB (ref 36.0–46.0)
HEMOGLOBIN: 8.9 g/dL — AB (ref 12.0–15.0)
MCH: 26.5 pg (ref 26.0–34.0)
MCHC: 30.8 g/dL (ref 30.0–36.0)
MCV: 86 fL (ref 78.0–100.0)
Platelets: 250 10*3/uL (ref 150–400)
RBC: 3.36 MIL/uL — ABNORMAL LOW (ref 3.87–5.11)
RDW: 15.6 % — AB (ref 11.5–15.5)
WBC: 10.6 10*3/uL — AB (ref 4.0–10.5)

## 2015-04-13 NOTE — Progress Notes (Signed)
Patient ID: Rachel Conner, female   DOB: 20-Mar-1936, 79 y.o.   MRN: [id number]  She is more calm now and seems to agree to stay

## 2015-04-13 NOTE — Progress Notes (Signed)
2 Days Post-Op  Subjective: Patient reports being angry about her care. She apparently had to be restrained last night for confusion. She is now totally awake and alert and demanding to be discharged. Denies chest pain, shortness of breath  Objective: Vital signs in last 24 hours: Temp:  [98.6 F (37 C)-99.5 F (37.5 C)] 98.8 F (37.1 C) (10/02 0512) Pulse Rate:  [75-86] 80 (10/02 0512) Resp:  [18-19] 19 (10/02 0512) BP: (100-118)/(40-72) 118/40 mmHg (10/02 0512) SpO2:  [89 %-96 %] 91 % (10/02 0512)    Intake/Output from previous day: 10/01 0701 - 10/02 0700 In: 2086 [P.O.:1260; I.V.:826] Out: 1050 [Urine:1050] Intake/Output this shift:    Walking in the halls Answers all questions, is awake, alert, and oriented x3 Lungs clear Abdomen soft, non distended  Lab Results:   Recent Labs  04/12/15 0523 04/13/15 0527  WBC 13.9* 10.6*  HGB 10.1* 8.9*  HCT 31.8* 28.9*  PLT 262 250   BMET  Recent Labs  04/12/15 0523 04/13/15 0527  NA 138 138  K 4.0 3.4*  CL 103 101  CO2 27 29  GLUCOSE 141* 99  BUN 14 9  CREATININE 0.86 0.90  CALCIUM 8.6* 8.5*   PT/INR No results for input(s): LABPROT, INR in the last 72 hours. ABG No results for input(s): PHART, HCO3 in the last 72 hours.  Invalid input(s): PCO2, PO2  Studies/Results: No results found.  Anti-infectives: Anti-infectives    Start     Dose/Rate Route Frequency Ordered Stop   04/11/15 1430  ceFAZolin (ANCEF) IVPB 2 g/50 mL premix     2 g 100 mL/hr over 30 Minutes Intravenous To ShortStay Surgical 04/10/15 0912 04/11/15 1540      Assessment/Plan: s/p Procedure(s) with comments: LAPAROSCOPIC APPENDECTOMY (N/A) - Laparoscopic converted to open appendectomy   Postop blood loss anemia. Tolerating po  I had a long discussion with the patient regarding the events of last night.  She has made up her mind that she wants to leave despite me explaining to her the reason she needs to stay.  She is currently  otherwise showing totally appropriate behavior with no clear confusion.  I explained the risks of going home including worsening medical condition.  She refuses to stay.  She will have to sign out against medical advice if she wants to leave and will have to have family take her home. Hopefully, she will decide to stay.  LOS: 2 days    Rachel Conner A 04/13/2015

## 2015-04-13 NOTE — Progress Notes (Signed)
Patient was found with a large bag of home medications around 5 am this morning. She had already taken what she called "her morning meds" before she was discovered. Patient refuses to place medication at nurses station or send to pharmacy. She did however finally agree to give medication to daughter when she arrives. Patient has been slightly confused during the night, she did state that she "has been taking her medications the whole time she has been here, she dose not understand why its a problem now."  At this time patient is unwilling to allow staff to see bag of medications in order to try and figure out what has been taken.  Will try again after awhile. At this time medication is tied in a plastic bag in the top of her closet. She did state however that she was "just taking her Xanax and blood pressure pills".  During the night patient also became very agitated and demanded her IV be removed. Finally she agreed to allow RN to saline lock it instead of completely removing. Will continue to monitor patient. Jimmie Molly, RN

## 2015-04-13 NOTE — Progress Notes (Signed)
Pt has been pleasant and coherent since beginning of this shift. She notified nursing staff that she usually takes her amlodipine in the evening, medicine not given this morning. Requested pharmacy to adjust med times

## 2015-04-14 ENCOUNTER — Other Ambulatory Visit: Payer: Self-pay | Admitting: Cardiovascular Disease

## 2015-04-14 MED ORDER — RIVAROXABAN 20 MG PO TABS
20.0000 mg | ORAL_TABLET | Freq: Every day | ORAL | Status: DC
  Administered 2015-04-14: 20 mg via ORAL
  Filled 2015-04-14: qty 1

## 2015-04-14 MED ORDER — PANTOPRAZOLE SODIUM 40 MG PO TBEC
40.0000 mg | DELAYED_RELEASE_TABLET | Freq: Every day | ORAL | Status: DC
  Administered 2015-04-14: 40 mg via ORAL
  Filled 2015-04-14: qty 1

## 2015-04-14 MED ORDER — LEVOTHYROXINE SODIUM 100 MCG PO TABS
100.0000 ug | ORAL_TABLET | Freq: Every day | ORAL | Status: DC
  Administered 2015-04-14 – 2015-04-15 (×2): 100 ug via ORAL
  Filled 2015-04-14 (×2): qty 1

## 2015-04-14 MED ORDER — ALUM & MAG HYDROXIDE-SIMETH 200-200-20 MG/5ML PO SUSP
30.0000 mL | Freq: Four times a day (QID) | ORAL | Status: DC | PRN
Start: 2015-04-14 — End: 2015-04-15
  Administered 2015-04-14: 30 mL via ORAL
  Filled 2015-04-14 (×2): qty 30

## 2015-04-14 NOTE — Progress Notes (Signed)
Prn maalox administered for c/o gas and bloating

## 2015-04-14 NOTE — Progress Notes (Signed)
3 Days Post-Op  Subjective: Feeling better Tolerated liquids Passing flatus  Objective: Vital signs in last 24 hours: Temp:  [98.5 F (36.9 C)-99.2 F (37.3 C)] 99.2 F (37.3 C) (10/03 0609) Pulse Rate:  [69-83] 83 (10/03 0609) Resp:  [19-20] 19 (10/03 0609) BP: (114-126)/(49-63) 126/49 mmHg (10/03 0609) SpO2:  [83 %-90 %] 90 % (10/03 0609) Last BM Date:  (none since admission)  Intake/Output from previous day: 10/02 0701 - 10/03 0700 In: 840 [P.O.:840] Out: -  Intake/Output this shift:    Abdomen soft Incision clean  Lab Results:   Recent Labs  04/12/15 0523 04/13/15 0527  WBC 13.9* 10.6*  HGB 10.1* 8.9*  HCT 31.8* 28.9*  PLT 262 250   BMET  Recent Labs  04/12/15 0523 04/13/15 0527  NA 138 138  K 4.0 3.4*  CL 103 101  CO2 27 29  GLUCOSE 141* 99  BUN 14 9  CREATININE 0.86 0.90  CALCIUM 8.6* 8.5*   PT/INR No results for input(s): LABPROT, INR in the last 72 hours. ABG No results for input(s): PHART, HCO3 in the last 72 hours.  Invalid input(s): PCO2, PO2  Studies/Results: No results found.  Anti-infectives: Anti-infectives    Start     Dose/Rate Route Frequency Ordered Stop   04/11/15 1430  ceFAZolin (ANCEF) IVPB 2 g/50 mL premix     2 g 100 mL/hr over 30 Minutes Intravenous To [facility] 04/10/15 0912 04/11/15 1540      Assessment/Plan: s/p Procedure(s) with comments: LAPAROSCOPIC APPENDECTOMY (N/A) - Laparoscopic converted to open appendectomy   Soft diet today Hopefully home tomorrow  LOS: 3 days    Blue Ruggerio A 04/14/2015

## 2015-04-14 NOTE — Progress Notes (Signed)
Pt reports relief from the administered zofran. Currently sitting in recliner chair reading a book

## 2015-04-14 NOTE — Care Management Note (Signed)
Case Management Note  Patient Details  Name: EFRAT ZUIDEMA MRN: [id number] Date of Birth: [birth year]  Subjective/Objective:                    Action/Plan:  UR completed  Expected Discharge Date:                  Expected Discharge Plan:  Home/Self Care  In-House Referral:     Discharge planning Services     Post Acute Care Choice:    Choice offered to:     DME Arranged:    DME Agency:     HH Arranged:    [place] Agency:     Status of Service:  In process, will continue to follow  Medicare Important Message Given:  Yes-second notification given Date Medicare IM Given:    Medicare IM give by:    Date Additional Medicare IM Given:    Additional Medicare Important Message give by:     If discussed at [place] of Stay Meetings, dates discussed:    Additional Comments:  Marilu Favre, RN 04/14/2015, 2:14 PM

## 2015-04-14 NOTE — Progress Notes (Signed)
Prn zofran administered for c/o of nausea

## 2015-04-14 NOTE — Care Management Important Message (Signed)
Important Message  Patient Details  Name: Rachel Conner MRN: [id number] Date of Birth: 1936/06/16   Medicare Important Message Given:  Yes-second notification given    Delorse Lek 04/14/2015, 1:32 PM

## 2015-04-14 NOTE — Progress Notes (Signed)
Patient has been pleasantly confused throughout night. Around 2am she called daughter telling her she was stuck at the [facility] home, and she needed to be picked up. Daughter did come to spend the night. Per conversations with both daughters today, patient has been increasingly confused over the past year; becoming worse at night. Per daughter Jeani Hawking she is her mothers medical power of attorney and in charge of medical decisions.  Jimmie Molly, RN

## 2015-04-15 LAB — GLUCOSE, CAPILLARY: GLUCOSE-CAPILLARY: 112 mg/dL — AB (ref 65–99)

## 2015-04-15 NOTE — Progress Notes (Signed)
Patient ID: Rachel Conner, female   DOB: 07-Mar-1936, 79 y.o.   MRN: [id number]  Doing well now Family in room Wants to go home  Will discharge

## 2015-04-15 NOTE — Progress Notes (Signed)
4 Days Post-Op  Subjective: Feeling a little bit better but sore  Objective: Vital signs in last 24 hours: Temp:  [98.1 F (36.7 C)-99.1 F (37.3 C)] 98.1 F (36.7 C) (10/04 0509) Pulse Rate:  [76-90] 81 (10/04 0605) Resp:  [18-19] 19 (10/04 0509) BP: (126-137)/(54-62) 129/54 mmHg (10/04 0605) SpO2:  [94 %-95 %] 94 % (10/04 0605) Last BM Date:  (PTA)  Intake/Output from previous day: 10/03 0701 - 10/04 0700 In: 800 [P.O.:800] Out: -  Intake/Output this shift:    Lungs clear Abdomen soft, non tender, incision clean  Lab Results:   Recent Labs  04/13/15 0527  WBC 10.6*  HGB 8.9*  HCT 28.9*  PLT 250   BMET  Recent Labs  04/13/15 0527  NA 138  K 3.4*  CL 101  CO2 29  GLUCOSE 99  BUN 9  CREATININE 0.90  CALCIUM 8.5*   PT/INR No results for input(s): LABPROT, INR in the last 72 hours. ABG No results for input(s): PHART, HCO3 in the last 72 hours.  Invalid input(s): PCO2, PO2  Studies/Results: No results found.  Anti-infectives: Anti-infectives    Start     Dose/Rate Route Frequency Ordered Stop   04/11/15 1430  ceFAZolin (ANCEF) IVPB 2 g/50 mL premix     2 g 100 mL/hr over 30 Minutes Intravenous To ShortStay Surgical 04/10/15 0912 04/11/15 1540      Assessment/Plan: s/p Procedure(s) with comments: LAPAROSCOPIC APPENDECTOMY (N/A) - Laparoscopic converted to open appendectomy   Continuing to improve Home later today vs tomorrow  LOS: 4 days    Emonnie Cannady A 04/15/2015

## 2015-04-15 NOTE — Discharge Instructions (Signed)
[facility]      [facility] [phone]  OPEN ABDOMINAL SURGERY: POST OP INSTRUCTIONS  Always review your discharge instruction sheet given to you by the facility where your surgery was performed.  IF YOU HAVE DISABILITY OR FAMILY LEAVE FORMS, YOU MUST BRING THEM TO THE OFFICE FOR PROCESSING.  PLEASE DO NOT GIVE THEM TO YOUR DOCTOR.  1. A prescription for pain medication may be given to you upon discharge.  Take your pain medication as prescribed, if needed.  If narcotic pain medicine is not needed, then you may take acetaminophen (Tylenol) or ibuprofen (Advil) as needed. 2. Take your usually prescribed medications unless otherwise directed. 3. If you need a refill on your pain medication, please contact your pharmacy. They will contact our office to request authorization.  Prescriptions will not be filled after 5pm or on week-ends. 4. You should follow a light diet the first few days after arrival home, such as soup and crackers, pudding, etc.unless your doctor has advised otherwise. A high-fiber, low fat diet can be resumed as tolerated.   Be sure to include lots of fluids daily. Most patients will experience some swelling and bruising on the chest and neck area.  Ice packs will help.  Swelling and bruising can take several days to resolve 5. Most patients will experience some swelling and bruising in the area of the incision. Ice pack will help. Swelling and bruising can take several days to resolve..  6. It is common to experience some constipation if taking pain medication after surgery.  Increasing fluid intake and taking a stool softener will usually help or prevent this problem from occurring.  A mild laxative (Milk of Magnesia or Miralax) should be taken according to package directions if there are no bowel movements after 48 hours. 7.  You may have steri-strips (small skin tapes) in place directly over the incision.  These strips should be left on the skin for 7-10 days.  If your  surgeon used skin glue on the incision, you may shower in 24 hours.  The glue will flake off over the next 2-3 weeks.  Any sutures or staples will be removed at the office during your follow-up visit. You may find that a light gauze bandage over your incision may keep your staples from being rubbed or pulled. You may shower and replace the bandage daily. 8. ACTIVITIES:  You may resume regular (light) daily activities beginning the next day--such as daily self-care, walking, climbing stairs--gradually increasing activities as tolerated.  You may have sexual intercourse when it is comfortable.  Refrain from any heavy lifting or straining until approved by your doctor. a. You may drive when you no longer are taking prescription pain medication, you can comfortably wear a seatbelt, and you can safely maneuver your car and apply brakes b. Return to Work: ___________________________________ 9. You should see your doctor in the office for a follow-up appointment approximately two weeks after your surgery.  Make sure that you call for this appointment within a day or two after you arrive home to insure a convenient appointment time. OTHER INSTRUCTIONS:  _____________________________________________________________ _____________________________________________________________  WHEN TO CALL YOUR DOCTOR: 1. Fever over 101.0 2. Inability to urinate 3. Nausea and/or vomiting 4. Extreme swelling or bruising 5. Continued bleeding from incision. 6. Increased pain, redness, or drainage from the incision. 7. Difficulty swallowing or breathing 8. Muscle cramping or spasms. 9. Numbness or tingling in hands or feet or around lips.  The clinic staff is available to   answer your questions during regular business hours.  Please don't hesitate to call and ask to speak to one of the nurses if you have concerns.  For further questions, please visit [email or web address]   

## 2015-04-15 NOTE — Progress Notes (Signed)
Rachel Conner to be D/C'd Home per MD order.  Discussed with the patient and all questions fully answered.  VSS, Skin clean, dry and intact without evidence of skin break down, no evidence of skin tears noted. IV catheter discontinued intact. Site without signs and symptoms of complications. Dressing and pressure applied.  An After Visit Summary was printed and given to the patient. Patient received prescription.  D/c education completed with patient/family including follow up instructions, medication list, d/c activities limitations if indicated, with other d/c instructions as indicated by MD - patient able to verbalize understanding, all questions fully answered.   Patient instructed to return to ED, call 911, or call MD for any changes in condition.   Patient escorted via [place], and D/C home via private auto.  [name] [name] 04/15/2015 1300

## 2015-04-15 NOTE — Discharge Summary (Signed)
Physician Discharge Summary  Patient ID: Rachel Conner MRN: [id number] DOB/AGE: November 06, 1935 79 y.o.  Admit date: 04/11/2015 Discharge date: 04/15/2015  Admission Diagnoses:  Discharge Diagnoses:  Active Problems:   Chronic appendicitis   Discharged Condition: good  Hospital Course: uneventful post op recovery except for mild sundowning which resolved  Consults: None  Significant Diagnostic Studies:   Treatments: surgery: lap converted to open appendectomy  Discharge Exam: Blood pressure 129/54, pulse 81, temperature 98.1 F (36.7 C), temperature source Oral, resp. rate 19, height 5\' 5"  (1.651 m), weight 99 kg (218 lb 4.1 oz), SpO2 94 %. General appearance: alert, cooperative and no distress Resp: clear to auscultation bilaterally Cardio: regular rate and rhythm, S1, S2 normal, no murmur, click, rub or gallop Incision/Wound:incisions healing well, abdomen non tender  Disposition: 01-Home or Self Care     Medication List    TAKE these medications        ALPRAZolam 0.25 MG tablet  Commonly known as:  XANAX  Take 0.25 mg by mouth 2 (two) times daily as needed for anxiety.     amLODipine 10 MG tablet  Commonly known as:  NORVASC  TAKE 1 TABLET DAILY     aspirin 325 MG tablet  Take 325 mg by mouth daily.     esomeprazole 20 MG capsule  Commonly known as:  NEXIUM  Take 20 mg by mouth daily at 12 noon.     hydrOXYzine 10 MG tablet  Commonly known as:  ATARAX/VISTARIL  Take 10 mg by mouth daily as needed for anxiety.     levothyroxine 100 MCG tablet  Commonly known as:  SYNTHROID, LEVOTHROID  Take 100 mcg by mouth daily.     losartan-hydrochlorothiazide 50-12.5 MG tablet  Commonly known as:  HYZAAR  TAKE 1 TABLET DAILY     ondansetron 4 MG disintegrating tablet  Commonly known as:  ZOFRAN ODT  Take 1 tablet (4 mg total) by mouth every 8 (eight) hours as needed for nausea or vomiting.     promethazine 25 MG tablet  Commonly known as:  PHENERGAN  Take 1  tablet (25 mg total) by mouth every 8 (eight) hours as needed for nausea or vomiting.     rivaroxaban 20 MG Tabs tablet  Commonly known as:  XARELTO  Take 20 mg by mouth daily.     rosuvastatin 20 MG tablet  Commonly known as:  CRESTOR  Take 20 mg by mouth daily.     Vitamin D-3 5000 UNITS Tabs  Take 10,000 Units by mouth.           Follow-up Information    Follow up with [facility], MD. Schedule an appointment as soon as possible for a visit in 1 week.   Specialty:  General Surgery   Why:  For suture removal   Contact information:   [address] [place] [address] [phone]       Signed: Harl Bowie 04/15/2015, 11:52 AM

## 2015-04-25 ENCOUNTER — Other Ambulatory Visit: Payer: Self-pay | Admitting: Surgery

## 2015-04-25 ENCOUNTER — Encounter (HOSPITAL_COMMUNITY): Payer: Self-pay | Admitting: *Deleted

## 2015-04-25 NOTE — Progress Notes (Signed)
Spoke with pt's daughter Cyndie Mull (received permission to speak with Lelon Frohlich by her sister, Jeani Hawking who is patient's POA). Lelon Frohlich states pt was just discharged from [facility] today, had been admitted for weakness and confusion. She states pt was given her Xarelto this AM prior to her discharge this AM.

## 2015-04-27 MED ORDER — CEFAZOLIN SODIUM-DEXTROSE 2-3 GM-% IV SOLR
2.0000 g | INTRAVENOUS | Status: AC
  Administered 2015-04-28: 2 g via INTRAVENOUS
  Filled 2015-04-27: qty 50

## 2015-04-27 MED ORDER — METRONIDAZOLE IN NACL 5-0.79 MG/ML-% IV SOLN
500.0000 mg | INTRAVENOUS | Status: AC
  Administered 2015-04-28: 500 mg via INTRAVENOUS
  Filled 2015-04-27: qty 100

## 2015-04-27 NOTE — H&P (Signed)
Rachel Conner is an 79 y.o. female.   Chief Complaint: cancer of the appendix HPI: this is an unfortunate patient who is s/p a recent lap converted to open appendectomy.  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  She has had issues with confusion both pre and post op which may be secondary to the tumor or to her depression from the loss of her husband.  From a physical standpoint, she has recovered from the appendectomy  Past Medical History  Diagnosis Date  . Arthritis     Osteoarthrosis  . CAD (coronary artery disease) 1999    CABG  . HLD (hyperlipidemia)   . HTN (hypertension)   . Hypothyroidism   . Colon polyps   . Hiatal hernia   . Peripheral neuropathy ([place])   . Anxiety   . Hemorrhoids   . Major depressive disorder ([place])   . MI (myocardial infarction) ([place])   . DVT (deep venous thrombosis) ([place])   . CHF (congestive heart failure) ([place])   . GERD (gastroesophageal reflux disease)   . Cancer [facility])     cancer of the appendix   . Confusion, postoperative     Past Surgical History  Procedure Laterality Date  . Carotid stent  07/1998    stent RCA   . Coronary stent placement  10/2001    circumflex stent  . Lumbar laminectomy      fusion 1971  . Thyroid surgery    . Cholecystectomy  2011  . Esophagogastroduodenoscopy    . Colonoscopy w/ biopsies    . Right thumb Right     Thumb Finger Release  . Coronary artery bypass graft  1999    6 vessel per pt.  . Laparoscopic appendectomy N/A 04/11/2015    Procedure: LAPAROSCOPIC APPENDECTOMY;  Surgeon: Coralie Keens, MD;  Location: [place];  Service: General;  Laterality: N/A;  Laparoscopic converted to open appendectomy     Family History  Problem Relation Age of Onset  . Heart disease Father   . Heart disease Mother   . Heart disease Brother   . Heart disease Other     grandfather (side unknown)  . Colon cancer Neg Hx   . Cancer Sister     lumphoma   Social History:   reports that she has quit smoking. She has never used smokeless tobacco. She reports that she does not drink alcohol or use illicit drugs.  Allergies:  Allergies  Allergen Reactions  . Nitrogen Other (See Comments)    Hypotension  . Nitroglycerin Other (See Comments)    Hypotension  . Myrbetriq [Mirabegron] Other (See Comments)    Gross hematuria  . Niaspan [Niacin Er]     Severe flushing    No prescriptions prior to admission    No results found for this or any previous visit (from the past 48 hour(s)). No results found.  Review of Systems  All other systems reviewed and are negative.   There were no vitals taken for this visit. Physical Exam  Constitutional: She appears well-developed and well-nourished.  HENT:  Head: Normocephalic and atraumatic.  Right Ear: External ear normal.  Left Ear: External ear normal.  Nose: Nose normal.  Mouth/Throat: Oropharynx is clear and moist.  Eyes: Conjunctivae are normal. Pupils are equal, round, and reactive to light.  Neck: Neck supple. No tracheal deviation present.  Cardiovascular: Normal rate, regular rhythm and normal heart sounds.   Respiratory: Effort normal and breath sounds normal. No  respiratory distress. She has no wheezes.  GI: Soft. She exhibits no distension.  Well healing midline incision  Musculoskeletal: Normal range of motion.  Neurological: She is alert.  Skin: Skin is warm.  Psychiatric: Her behavior is normal.     Assessment/Plan Cancer of the appendix  I discussed this with the patient and her family.  A partial colectomy is recommend to obtain negative margins and for lymph node analysis.  I discussed the risks which include but are not limited to bleeding, infection, injury to surrounding structures, anastomotic leak or breakdown, the need for further surgery, cardiopulmonary problems, DVT, need for rehab or SNF post op, etc.  They agree to proceed.  [name] A 04/27/2015, 7:46 PM

## 2015-04-28 ENCOUNTER — Encounter (HOSPITAL_COMMUNITY): Payer: Self-pay | Admitting: *Deleted

## 2015-04-28 ENCOUNTER — Inpatient Hospital Stay (HOSPITAL_COMMUNITY): Payer: Medicare Other | Admitting: Critical Care Medicine

## 2015-04-28 ENCOUNTER — Inpatient Hospital Stay (HOSPITAL_COMMUNITY)
Admission: RE | Admit: 2015-04-28 | Discharge: 2015-05-03 | DRG: 331 | Disposition: A | Discharge status: Home Health | Payer: Medicare Other | Source: Ambulatory Visit | Attending: Surgery | Admitting: Surgery

## 2015-04-28 ENCOUNTER — Encounter (HOSPITAL_COMMUNITY)
Admission: RE | Disposition: A | Discharge status: Home Health | Payer: Self-pay | Source: Ambulatory Visit | Attending: Surgery

## 2015-04-28 DIAGNOSIS — G629 Polyneuropathy, unspecified: Secondary | ICD-10-CM | POA: Diagnosis present

## 2015-04-28 DIAGNOSIS — Z888 Allergy status to other drugs, medicaments and biological substances status: Secondary | ICD-10-CM

## 2015-04-28 DIAGNOSIS — E785 Hyperlipidemia, unspecified: Secondary | ICD-10-CM | POA: Diagnosis present

## 2015-04-28 DIAGNOSIS — Z889 Allergy status to unspecified drugs, medicaments and biological substances status: Secondary | ICD-10-CM | POA: Diagnosis not present

## 2015-04-28 DIAGNOSIS — I2510 Atherosclerotic heart disease of native coronary artery without angina pectoris: Secondary | ICD-10-CM | POA: Diagnosis present

## 2015-04-28 DIAGNOSIS — F329 Major depressive disorder, single episode, unspecified: Secondary | ICD-10-CM | POA: Diagnosis present

## 2015-04-28 DIAGNOSIS — I252 Old myocardial infarction: Secondary | ICD-10-CM | POA: Diagnosis not present

## 2015-04-28 DIAGNOSIS — Z955 Presence of coronary angioplasty implant and graft: Secondary | ICD-10-CM

## 2015-04-28 DIAGNOSIS — K219 Gastro-esophageal reflux disease without esophagitis: Secondary | ICD-10-CM | POA: Diagnosis present

## 2015-04-28 DIAGNOSIS — C181 Malignant neoplasm of appendix: Principal | ICD-10-CM | POA: Diagnosis present

## 2015-04-28 DIAGNOSIS — Z8249 Family history of ischemic heart disease and other diseases of the circulatory system: Secondary | ICD-10-CM | POA: Diagnosis not present

## 2015-04-28 DIAGNOSIS — Z87891 Personal history of nicotine dependence: Secondary | ICD-10-CM

## 2015-04-28 DIAGNOSIS — Z981 Arthrodesis status: Secondary | ICD-10-CM

## 2015-04-28 DIAGNOSIS — F0390 Unspecified dementia without behavioral disturbance: Secondary | ICD-10-CM | POA: Diagnosis present

## 2015-04-28 DIAGNOSIS — Z951 Presence of aortocoronary bypass graft: Secondary | ICD-10-CM

## 2015-04-28 DIAGNOSIS — I10 Essential (primary) hypertension: Secondary | ICD-10-CM | POA: Diagnosis present

## 2015-04-28 DIAGNOSIS — Z86718 Personal history of other venous thrombosis and embolism: Secondary | ICD-10-CM

## 2015-04-28 DIAGNOSIS — E039 Hypothyroidism, unspecified: Secondary | ICD-10-CM | POA: Diagnosis present

## 2015-04-28 DIAGNOSIS — Z809 Family history of malignant neoplasm, unspecified: Secondary | ICD-10-CM

## 2015-04-28 DIAGNOSIS — F419 Anxiety disorder, unspecified: Secondary | ICD-10-CM | POA: Diagnosis present

## 2015-04-28 DIAGNOSIS — C189 Malignant neoplasm of colon, unspecified: Secondary | ICD-10-CM | POA: Diagnosis present

## 2015-04-28 HISTORY — DX: Disorientation, unspecified: R41.0

## 2015-04-28 HISTORY — PX: PARTIAL COLECTOMY: SHX5273

## 2015-04-28 HISTORY — DX: Malignant (primary) neoplasm, unspecified: C80.1

## 2015-04-28 LAB — BASIC METABOLIC PANEL
ANION GAP: 8 (ref 5–15)
BUN: 10 mg/dL (ref 6–20)
CALCIUM: 9.1 mg/dL (ref 8.9–10.3)
CO2: 32 mmol/L (ref 22–32)
Chloride: 98 mmol/L — ABNORMAL LOW (ref 101–111)
Creatinine, Ser: 0.96 mg/dL (ref 0.44–1.00)
GFR, EST NON AFRICAN AMERICAN: 56 mL/min — AB (ref >60)
Glucose, Bld: 111 mg/dL — ABNORMAL HIGH (ref 65–99)
POTASSIUM: 3.3 mmol/L — AB (ref 3.5–5.1)
SODIUM: 138 mmol/L (ref 135–145)

## 2015-04-28 LAB — TYPE AND SCREEN
ABO/RH(D): O POS
Antibody Screen: NEGATIVE

## 2015-04-28 LAB — CBC
HEMATOCRIT: 35.5 % — AB (ref 36.0–46.0)
HEMOGLOBIN: 10.8 g/dL — AB (ref 12.0–15.0)
MCH: 25.7 pg — ABNORMAL LOW (ref 26.0–34.0)
MCHC: 30.4 g/dL (ref 30.0–36.0)
MCV: 84.5 fL (ref 78.0–100.0)
Platelets: 403 10*3/uL — ABNORMAL HIGH (ref 150–400)
RBC: 4.2 MIL/uL (ref 3.87–5.11)
RDW: 15.5 % (ref 11.5–15.5)
WBC: 7.3 10*3/uL (ref 4.0–10.5)

## 2015-04-28 LAB — PROTIME-INR
INR: 1.02 (ref 0.00–1.49)
PROTHROMBIN TIME: 13.6 s (ref 11.6–15.2)

## 2015-04-28 LAB — ABO/RH: ABO/RH(D): O POS

## 2015-04-28 SURGERY — COLECTOMY, PARTIAL
Anesthesia: General | Site: Abdomen

## 2015-04-28 MED ORDER — LACTATED RINGERS IV SOLN
INTRAVENOUS | Status: DC
  Administered 2015-04-28 (×2): via INTRAVENOUS

## 2015-04-28 MED ORDER — NALOXONE HCL 0.4 MG/ML IJ SOLN: 0.4000 mg | INTRAMUSCULAR | Status: DC | PRN

## 2015-04-28 MED ORDER — SODIUM CHLORIDE 0.9 % IJ SOLN: 9.0000 mL | INTRAMUSCULAR | Status: DC | PRN

## 2015-04-28 MED ORDER — ALPRAZOLAM 0.5 MG PO TABS
1.0000 mg | ORAL_TABLET | Freq: Three times a day (TID) | ORAL | Status: DC | PRN
  Administered 2015-04-29 (×2): 1 mg via ORAL
  Filled 2015-04-28 (×2): qty 2

## 2015-04-28 MED ORDER — ONDANSETRON HCL 4 MG/2ML IJ SOLN
INTRAMUSCULAR | Status: AC
  Administered 2015-04-28: 4 mg
  Filled 2015-04-28: qty 2

## 2015-04-28 MED ORDER — ONDANSETRON HCL 4 MG/2ML IJ SOLN
4.0000 mg | Freq: Once | INTRAMUSCULAR | Status: AC
  Administered 2015-04-28: 4 mg via INTRAVENOUS

## 2015-04-28 MED ORDER — ONDANSETRON HCL 4 MG/2ML IJ SOLN
INTRAMUSCULAR | Status: DC | PRN
Start: 2015-04-28 — End: 2015-04-28
  Administered 2015-04-28: 4 mg via INTRAVENOUS

## 2015-04-28 MED ORDER — HYDROCHLOROTHIAZIDE 12.5 MG PO CAPS
12.5000 mg | ORAL_CAPSULE | Freq: Every day | ORAL | Status: DC
  Administered 2015-04-29 – 2015-05-03 (×5): 12.5 mg via ORAL
  Filled 2015-04-28 (×5): qty 1

## 2015-04-28 MED ORDER — MIDAZOLAM HCL 2 MG/2ML IJ SOLN
INTRAMUSCULAR | Status: AC
  Filled 2015-04-28: qty 4

## 2015-04-28 MED ORDER — ONDANSETRON HCL 4 MG/2ML IJ SOLN
INTRAMUSCULAR | Status: AC
  Filled 2015-04-28: qty 2

## 2015-04-28 MED ORDER — DIPHENHYDRAMINE HCL 12.5 MG/5ML PO ELIX: 12.5000 mg | ORAL_SOLUTION | Freq: Four times a day (QID) | ORAL | Status: DC | PRN

## 2015-04-28 MED ORDER — FENTANYL CITRATE (PF) 250 MCG/5ML IJ SOLN
INTRAMUSCULAR | Status: DC | PRN
  Administered 2015-04-28 (×3): 50 ug via INTRAVENOUS

## 2015-04-28 MED ORDER — HYDROMORPHONE HCL 1 MG/ML IJ SOLN
INTRAMUSCULAR | Status: AC
  Administered 2015-04-28: 0.5 mg via INTRAVENOUS
  Filled 2015-04-28: qty 1

## 2015-04-28 MED ORDER — SODIUM CHLORIDE 0.9 % IJ SOLN
INTRAMUSCULAR | Status: AC
  Filled 2015-04-28: qty 10

## 2015-04-28 MED ORDER — ONDANSETRON 4 MG PO TBDP
4.0000 mg | ORAL_TABLET | Freq: Three times a day (TID) | ORAL | Status: DC | PRN
  Administered 2015-05-02 – 2015-05-03 (×2): 4 mg via ORAL
  Filled 2015-04-28 (×2): qty 1

## 2015-04-28 MED ORDER — FENTANYL CITRATE (PF) 250 MCG/5ML IJ SOLN
INTRAMUSCULAR | Status: AC
  Filled 2015-04-28: qty 5

## 2015-04-28 MED ORDER — 0.9 % SODIUM CHLORIDE (POUR BTL) OPTIME
TOPICAL | Status: DC | PRN
  Administered 2015-04-28: 1000 mL

## 2015-04-28 MED ORDER — DEXAMETHASONE SODIUM PHOSPHATE 4 MG/ML IJ SOLN
INTRAMUSCULAR | Status: DC | PRN
  Administered 2015-04-28: 4 mg via INTRAVENOUS

## 2015-04-28 MED ORDER — POTASSIUM CHLORIDE IN NACL 20-0.9 MEQ/L-% IV SOLN
INTRAVENOUS | Status: DC
  Administered 2015-04-28 – 2015-05-01 (×4): via INTRAVENOUS
  Filled 2015-04-28 (×6): qty 1000

## 2015-04-28 MED ORDER — EPHEDRINE SULFATE 50 MG/ML IJ SOLN
INTRAMUSCULAR | Status: AC
  Filled 2015-04-28: qty 1

## 2015-04-28 MED ORDER — ONDANSETRON HCL 4 MG/2ML IJ SOLN
4.0000 mg | Freq: Once | INTRAMUSCULAR | Status: AC | PRN
  Administered 2015-04-28: 4 mg via INTRAVENOUS

## 2015-04-28 MED ORDER — ONDANSETRON HCL 4 MG PO TABS: 4.0000 mg | ORAL_TABLET | Freq: Four times a day (QID) | ORAL | Status: DC | PRN

## 2015-04-28 MED ORDER — HYDROMORPHONE HCL 1 MG/ML IJ SOLN
0.2500 mg | INTRAMUSCULAR | Status: DC | PRN
  Administered 2015-04-28 (×4): 0.5 mg via INTRAVENOUS

## 2015-04-28 MED ORDER — HYDROMORPHONE HCL 1 MG/ML IJ SOLN
INTRAMUSCULAR | Status: AC
  Filled 2015-04-28: qty 1

## 2015-04-28 MED ORDER — PROPOFOL 10 MG/ML IV BOLUS
INTRAVENOUS | Status: DC | PRN
  Administered 2015-04-28: 100 mg via INTRAVENOUS

## 2015-04-28 MED ORDER — ROCURONIUM BROMIDE 100 MG/10ML IV SOLN
INTRAVENOUS | Status: DC | PRN
  Administered 2015-04-28: 50 mg via INTRAVENOUS

## 2015-04-28 MED ORDER — DIPHENHYDRAMINE HCL 50 MG/ML IJ SOLN: 12.5000 mg | Freq: Four times a day (QID) | INTRAMUSCULAR | Status: DC | PRN

## 2015-04-28 MED ORDER — LOSARTAN POTASSIUM 50 MG PO TABS
50.0000 mg | ORAL_TABLET | Freq: Every day | ORAL | Status: DC
  Administered 2015-04-29 – 2015-05-03 (×5): 50 mg via ORAL
  Filled 2015-04-28 (×5): qty 1

## 2015-04-28 MED ORDER — MIDAZOLAM HCL 5 MG/5ML IJ SOLN
INTRAMUSCULAR | Status: DC | PRN
  Administered 2015-04-28: 1 mg via INTRAVENOUS

## 2015-04-28 MED ORDER — PROPOFOL 10 MG/ML IV BOLUS
INTRAVENOUS | Status: AC
  Filled 2015-04-28: qty 20

## 2015-04-28 MED ORDER — MORPHINE SULFATE 2 MG/ML IV SOLN
INTRAVENOUS | Status: AC
  Filled 2015-04-28: qty 25

## 2015-04-28 MED ORDER — LIDOCAINE HCL (CARDIAC) 20 MG/ML IV SOLN
INTRAVENOUS | Status: DC | PRN
  Administered 2015-04-28: 40 mg via INTRAVENOUS

## 2015-04-28 MED ORDER — LIDOCAINE HCL (CARDIAC) 20 MG/ML IV SOLN
INTRAVENOUS | Status: AC
  Filled 2015-04-28: qty 10

## 2015-04-28 MED ORDER — ONDANSETRON HCL 4 MG/2ML IJ SOLN
4.0000 mg | Freq: Four times a day (QID) | INTRAMUSCULAR | Status: DC | PRN
  Administered 2015-05-01: 4 mg via INTRAVENOUS
  Filled 2015-04-28: qty 2

## 2015-04-28 MED ORDER — LOSARTAN POTASSIUM-HCTZ 50-12.5 MG PO TABS: 1.0000 | ORAL_TABLET | Freq: Every day | ORAL | Status: DC

## 2015-04-28 MED ORDER — LEVOTHYROXINE SODIUM 100 MCG PO TABS
100.0000 ug | ORAL_TABLET | Freq: Every day | ORAL | Status: DC
  Administered 2015-04-29 – 2015-05-03 (×5): 100 ug via ORAL
  Filled 2015-04-28 (×5): qty 1

## 2015-04-28 MED ORDER — MEPERIDINE HCL 25 MG/ML IJ SOLN: 6.2500 mg | INTRAMUSCULAR | Status: DC | PRN

## 2015-04-28 MED ORDER — ONDANSETRON HCL 4 MG/2ML IJ SOLN: 4.0000 mg | Freq: Four times a day (QID) | INTRAMUSCULAR | Status: DC | PRN

## 2015-04-28 MED ORDER — ENOXAPARIN SODIUM 40 MG/0.4ML ~~LOC~~ SOLN
40.0000 mg | SUBCUTANEOUS | Status: DC
Start: 2015-04-29 — End: 2015-05-01
  Administered 2015-04-29 – 2015-05-01 (×3): 40 mg via SUBCUTANEOUS
  Filled 2015-04-28 (×3): qty 0.4

## 2015-04-28 MED ORDER — ALUM & MAG HYDROXIDE-SIMETH 200-200-20 MG/5ML PO SUSP
30.0000 mL | Freq: Four times a day (QID) | ORAL | Status: DC | PRN
  Administered 2015-05-03: 30 mL via ORAL
  Filled 2015-04-28: qty 30

## 2015-04-28 MED ORDER — ROCURONIUM BROMIDE 50 MG/5ML IV SOLN
INTRAVENOUS | Status: AC
  Filled 2015-04-28: qty 1

## 2015-04-28 MED ORDER — MORPHINE SULFATE 2 MG/ML IV SOLN
INTRAVENOUS | Status: DC
  Administered 2015-04-28: 15 mg via INTRAVENOUS
  Administered 2015-04-28: 10:00:00 via INTRAVENOUS
  Administered 2015-04-29: 7.5 mg via INTRAVENOUS
  Administered 2015-04-29: 5.7 mg via INTRAVENOUS
  Administered 2015-04-29: 18:00:00 via INTRAVENOUS
  Administered 2015-04-30: 3 mg via INTRAVENOUS
  Administered 2015-04-30: 1.5 mg via INTRAVENOUS
  Administered 2015-04-30: 4.5 mg via INTRAVENOUS
  Administered 2015-04-30: 6 mg via INTRAVENOUS
  Administered 2015-05-01: 3 mg via INTRAVENOUS
  Administered 2015-05-01 (×2): 0 mg via INTRAVENOUS
  Filled 2015-04-28: qty 25

## 2015-04-28 MED ORDER — SUGAMMADEX SODIUM 200 MG/2ML IV SOLN
INTRAVENOUS | Status: AC
  Filled 2015-04-28: qty 2

## 2015-04-28 MED ORDER — SUGAMMADEX SODIUM 200 MG/2ML IV SOLN
INTRAVENOUS | Status: DC | PRN
  Administered 2015-04-28: 200 mg via INTRAVENOUS

## 2015-04-28 SURGICAL SUPPLY — 62 items
BLADE SURG ROTATE 9660 (MISCELLANEOUS) IMPLANT
CANISTER SUCTION 2500CC (MISCELLANEOUS) ×3 IMPLANT
CHLORAPREP W/TINT 26ML (MISCELLANEOUS) ×1 IMPLANT
COVER MAYO STAND STRL (DRAPES) ×4 IMPLANT
COVER SURGICAL LIGHT HANDLE (MISCELLANEOUS) ×5 IMPLANT
DRAPE INCISE IOBAN 66X45 STRL (DRAPES) IMPLANT
DRAPE LAPAROSCOPIC ABDOMINAL (DRAPES) ×3 IMPLANT
DRAPE PROXIMA HALF (DRAPES) ×4 IMPLANT
DRAPE UTILITY XL STRL (DRAPES) ×4 IMPLANT
DRAPE WARM FLUID 44X44 (DRAPE) ×3 IMPLANT
DRSG OPSITE POSTOP 4X10 (GAUZE/BANDAGES/DRESSINGS) IMPLANT
DRSG OPSITE POSTOP 4X6 (GAUZE/BANDAGES/DRESSINGS) ×2 IMPLANT
DRSG OPSITE POSTOP 4X8 (GAUZE/BANDAGES/DRESSINGS) IMPLANT
ELECT BLADE 6.5 EXT (BLADE) ×1 IMPLANT
ELECT CAUTERY BLADE 6.4 (BLADE) ×6 IMPLANT
ELECT REM PT RETURN 9FT ADLT (ELECTROSURGICAL) ×3
ELECTRODE REM PT RTRN 9FT ADLT (ELECTROSURGICAL) ×1 IMPLANT
GLOVE BIO SURGEON STRL SZ7 (GLOVE) ×4 IMPLANT
GLOVE BIOGEL PI IND STRL 7.0 (GLOVE) IMPLANT
GLOVE BIOGEL PI IND STRL 7.5 (GLOVE) IMPLANT
GLOVE BIOGEL PI INDICATOR 7.0 (GLOVE) ×8
GLOVE BIOGEL PI INDICATOR 7.5 (GLOVE) ×4
GLOVE ECLIPSE 7.5 STRL STRAW (GLOVE) ×4 IMPLANT
GLOVE SURG SIGNA 7.5 PF LTX (GLOVE) ×6 IMPLANT
GOWN STRL REUS W/ TWL LRG LVL3 (GOWN DISPOSABLE) ×4 IMPLANT
GOWN STRL REUS W/ TWL XL LVL3 (GOWN DISPOSABLE) ×2 IMPLANT
GOWN STRL REUS W/TWL LRG LVL3 (GOWN DISPOSABLE) ×12
GOWN STRL REUS W/TWL XL LVL3 (GOWN DISPOSABLE) ×6
KIT BASIN OR (CUSTOM PROCEDURE TRAY) ×3 IMPLANT
KIT ROOM TURNOVER OR (KITS) ×3 IMPLANT
LEGGING LITHOTOMY PAIR STRL (DRAPES) IMPLANT
LIGASURE IMPACT 36 18CM CVD LR (INSTRUMENTS) ×3 IMPLANT
NS IRRIG 1000ML POUR BTL (IV SOLUTION) ×6 IMPLANT
PACK GENERAL/GYN (CUSTOM PROCEDURE TRAY) ×3 IMPLANT
PAD ARMBOARD 7.5X6 YLW CONV (MISCELLANEOUS) ×5 IMPLANT
PENCIL BUTTON HOLSTER BLD 10FT (ELECTRODE) ×3 IMPLANT
RELOAD PROXIMATE 75MM BLUE (ENDOMECHANICALS) ×6 IMPLANT
RELOAD STAPLE 75 3.8 BLU REG (ENDOMECHANICALS) IMPLANT
SPECIMEN JAR LARGE (MISCELLANEOUS) ×3 IMPLANT
SPONGE LAP 18X18 X RAY DECT (DISPOSABLE) IMPLANT
STAPLER GUN LINEAR PROX 60 (STAPLE) ×2 IMPLANT
STAPLER PROXIMATE 75MM BLUE (STAPLE) ×2 IMPLANT
STAPLER VISISTAT 35W (STAPLE) ×3 IMPLANT
SUCTION POOLE TIP (SUCTIONS) ×3 IMPLANT
SURGILUBE 2OZ TUBE FLIPTOP (MISCELLANEOUS) IMPLANT
SUT PDS AB 1 TP1 96 (SUTURE) ×4 IMPLANT
SUT PROLENE 2 0 CT2 30 (SUTURE) IMPLANT
SUT PROLENE 2 0 KS (SUTURE) IMPLANT
SUT SILK 2 0 SH CR/8 (SUTURE) ×3 IMPLANT
SUT SILK 2 0 TIES 10X30 (SUTURE) ×3 IMPLANT
SUT SILK 3 0 SH CR/8 (SUTURE) ×3 IMPLANT
SUT SILK 3 0 TIES 10X30 (SUTURE) ×3 IMPLANT
SUT VIC AB 3-0 SH 18 (SUTURE) IMPLANT
SYR BULB IRRIGATION 50ML (SYRINGE) ×3 IMPLANT
TOWEL OR 17X26 10 PK STRL BLUE (TOWEL DISPOSABLE) ×4 IMPLANT
TRAY FOLEY CATH 14FRSI W/METER (CATHETERS) ×3 IMPLANT
TRAY PROCTOSCOPIC FIBER OPTIC (SET/KITS/TRAYS/PACK) IMPLANT
TUBE CONNECTING 12'X1/4 (SUCTIONS) ×1
TUBE CONNECTING 12X1/4 (SUCTIONS) ×2 IMPLANT
UNDERPAD 30X30 INCONTINENT (UNDERPADS AND DIAPERS) IMPLANT
WATER STERILE IRR 1000ML POUR (IV SOLUTION) IMPLANT
YANKAUER SUCT BULB TIP NO VENT (SUCTIONS) ×4 IMPLANT

## 2015-04-28 NOTE — Progress Notes (Signed)
Utilization review completed. Meeghan Skipper, RN, BSN. 

## 2015-04-28 NOTE — Interval H&P Note (Signed)
History and Physical Interval Note: no change in H and P  04/28/2015 8:28 AM  Rachel Conner  has presented today for surgery, with the diagnosis of cancer of the appendix  The various methods of treatment have been discussed with the patient and family. After consideration of risks, benefits and other options for treatment, the patient has consented to  Procedure(s): PARTIAL COLECTOMY (N/A) as a surgical intervention .  The patient's history has been reviewed, patient examined, no change in status, stable for surgery.  I have reviewed the patient's chart and labs.  Questions were answered to the patient's satisfaction.     [name]

## 2015-04-28 NOTE — Transfer of Care (Signed)
Immediate Anesthesia Transfer of Care Note  Patient: Rachel Conner  Procedure(s) Performed: Procedure(s): Ileocecectomy (N/A)  Patient Location: PACU  Anesthesia Type:General  Level of Consciousness: awake and alert   Airway & Oxygen Therapy: Patient Spontanous Breathing and Patient connected to nasal cannula oxygen  Post-op Assessment: Report given to RN, Post -op Vital signs reviewed and stable and Patient moving all extremities X 4  Post vital signs: Reviewed and stable  Last Vitals:  Filed Vitals:   04/28/15 1010  BP:   Pulse:   Temp: 36.3 C  Resp:    HR 61, RR 16, Sats 95%, BP 563/87  Complications: No apparent anesthesia complications

## 2015-04-28 NOTE — Anesthesia Preprocedure Evaluation (Addendum)
Anesthesia Evaluation  Patient identified by MRN, date of birth, ID band Patient awake    Reviewed: Allergy & Precautions, NPO status , Patient's Chart, lab work & pertinent test results  Airway Mallampati: I  TM Distance: >3 FB Neck ROM: Full    Dental  (+) Dental Advisory Given   Pulmonary former smoker,    Pulmonary exam normal        Cardiovascular hypertension, + CAD, + Past MI, + CABG, + Peripheral Vascular Disease and +CHF  Normal cardiovascular exam     Neuro/Psych Anxiety Depression    GI/Hepatic GERD  Medicated,  Endo/Other    Renal/GU      Musculoskeletal  (+) Arthritis ,   Abdominal   Peds  Hematology   Anesthesia Other Findings   Reproductive/Obstetrics                            Anesthesia Physical Anesthesia Plan  ASA: III  Anesthesia Plan: General   Post-op Pain Management:    Induction: Intravenous  Airway Management Planned: Oral ETT  Additional Equipment:   Intra-op Plan:   Post-operative Plan: Extubation in OR  Informed Consent: I have reviewed the patients History and Physical, chart, labs and discussed the procedure including the risks, benefits and alternatives for the proposed anesthesia with the patient or authorized representative who has indicated his/her understanding and acceptance.   Consent reviewed with POA  Plan Discussed with: Surgeon and CRNA  Anesthesia Plan Comments:        Anesthesia Quick Evaluation

## 2015-04-28 NOTE — Op Note (Signed)
NAMEDORRI, OZTURK              ACCOUNT NO.:  [id number]  MEDICAL RECORD NO.:  [id number]  LOCATION:  6N32C                        FACILITY:  [facility]  PHYSICIAN:  Coralie Keens, M.D. DATE OF BIRTH:  [birth year]  DATE OF PROCEDURE:  04/28/2015 DATE OF DISCHARGE:                              OPERATIVE REPORT   PREOPERATIVE DIAGNOSIS:  Cancer of the appendix.  POSTOPERATIVE DIAGNOSIS:  Cancer of the appendix.  PROCEDURE:  Right ileocolectomy.  SURGEON:  Juliene Pina, M.D.  ASSISTANT:  Judyann Munson, RNFA.  ANESTHESIA:  General endotracheal anesthesia.  ESTIMATED BLOOD LOSS:  Minimal.  INDICATIONS:  This is a 79 year old female who had undergone a recent laparoscopic converted to open appendectomy for a retrocecal appendicitis.  Unfortunately, the final pathology pf appendix showed a carcinoid adenocarcinoma with positive margins.  I had already mobilized the right colon laparoscopically at the time of the initial procedure. Decision was made to proceed with partial colectomy.  PROCEDURE IN DETAIL:  The patient was brought to the operating room, identified as Rachel Conner. She was placed supine on the operating table and general anesthesia was induced.  Her abdomen was then prepped and draped in usual sterile fashion.  The staples at the midline incision had already been removed.  I opened the skin bluntly and took this down to the previously placed fascial sutures which were then excised gaining entrance in the peritoneal cavity.  I was then able to easily identify the cecum and right colon and distal ileum and pull this up out of the incision.  I then identified distal ileum and transected it with the GIA 75 stapler.  I then took down the mesentery with the ligature stapling device.  I then identified the mid right colon and transected this with a GIA 75 stapler as well.  I then took down the rest of the mesentery with the ligature device and closed 1 area in  the mesentery with 2-0 silk suture.  The rest of mesentery was taken down again with the ligature and specimen was sent to Pathology for evaluation.  I then realigned the remaining right colon and distal small bowel in a side-to-side fashion with interrupted silk suture.  I then performed a colotomy and enterotomy with the cautery and performed a side-to-side anastomosis with the GIA 75 stapler.  The open end was then closed with a TA 60 stapler.  A wide anastomosis appeared to be achieved.  I then reinforced the staple line with interrupted silk sutures.  I then placed this back into the abdominal cavity.  I then thoroughly irrigated the abdomen with normal saline.  Hemostasis appeared to be achieved.  The patient was redraped.  I then closed the fascia with running #1 looped PDS suture after we had changed gown and gloves.  I then irrigated the skin and closed the skin with skin staples.  The patient tolerated the procedure well.  All the counts were correct at the end of procedure.  The patient was then extubated in the operating room and taken in stable condition to recovery room.     Coralie Keens, M.D.     DB/MEDQ  D:  04/28/2015  T:  04/28/2015  Job:  [id number]

## 2015-04-28 NOTE — Op Note (Signed)
Right Ileocolectomy  Rachel Conner 04/28/2015   Pre-op Diagnosis: cancer of the appendix     Post-op Diagnosis: same  Procedure(s): Right Ileocolectomy  Surgeon(s): Coralie Keens, MD  Anesthesia: General  Staff:  Circulator: Milas Kocher, RN Relief Circulator: Cyd Silence, RN; Quincy Carnes, RN Scrub Person: Rolan Bucco Circulator Assistant: Ma Rings, RN RN First Assistant: Cyd Silence, RN  Estimated Blood Loss: Minimal               Specimens: sent to path          [facility] A   Date: 04/28/2015  Time: 10:00 AM

## 2015-04-28 NOTE — Anesthesia Procedure Notes (Signed)
Procedure Name: Intubation Date/Time: 04/28/2015 9:00 AM Performed by: Merrilyn Puma B Pre-anesthesia Checklist: Patient identified, Timeout performed, Emergency Drugs available, Suction available and Patient being monitored Patient Re-evaluated:Patient Re-evaluated prior to inductionOxygen Delivery Method: Circle system utilized Preoxygenation: Pre-oxygenation with 100% oxygen Intubation Type: IV induction Ventilation: Mask ventilation without difficulty Laryngoscope Size: Mac and 3 Grade View: Grade I Tube type: Oral Tube size: 7.0 mm Number of attempts: 1 Airway Equipment and Method: Stylet Placement Confirmation: CO2 detector,  ETT inserted through vocal cords under direct vision,  positive ETCO2 and breath sounds checked- equal and bilateral Secured at: 21 cm Tube secured with: Tape Dental Injury: Teeth and Oropharynx as per pre-operative assessment  Comments: DLx1 by Shanna Cisco.  Atraumatic insertion of 7.0ETT.

## 2015-04-28 NOTE — Anesthesia Postprocedure Evaluation (Signed)
Anesthesia Post Note  Patient: Rachel Conner  Procedure(s) Performed: Procedure(s) (LRB): Ileocecectomy (N/A)  Anesthesia type: general  Patient location: PACU  Post pain: Pain level controlled  Post assessment: Patient's Cardiovascular Status Stable  Last Vitals:  Filed Vitals:   04/28/15 1145  BP: 125/57  Pulse: 63  Temp: 36.1 C  Resp: 9    Post vital signs: Reviewed and stable  Level of consciousness: sedated  Complications: No apparent anesthesia complications

## 2015-04-29 ENCOUNTER — Encounter (HOSPITAL_COMMUNITY): Payer: Self-pay | Admitting: Surgery

## 2015-04-29 LAB — BASIC METABOLIC PANEL
ANION GAP: 6 (ref 5–15)
BUN: 9 mg/dL (ref 6–20)
CALCIUM: 8.4 mg/dL — AB (ref 8.9–10.3)
CO2: 32 mmol/L (ref 22–32)
Chloride: 103 mmol/L (ref 101–111)
Creatinine, Ser: 0.72 mg/dL (ref 0.44–1.00)
GLUCOSE: 116 mg/dL — AB (ref 65–99)
POTASSIUM: 3.2 mmol/L — AB (ref 3.5–5.1)
Sodium: 141 mmol/L (ref 135–145)

## 2015-04-29 LAB — CBC
HEMATOCRIT: 31.4 % — AB (ref 36.0–46.0)
HEMOGLOBIN: 9.6 g/dL — AB (ref 12.0–15.0)
MCH: 25.8 pg — ABNORMAL LOW (ref 26.0–34.0)
MCHC: 30.6 g/dL (ref 30.0–36.0)
MCV: 84.4 fL (ref 78.0–100.0)
PLATELETS: 373 10*3/uL (ref 150–400)
RBC: 3.72 MIL/uL — AB (ref 3.87–5.11)
RDW: 15.6 % — ABNORMAL HIGH (ref 11.5–15.5)
WBC: 13.9 10*3/uL — ABNORMAL HIGH (ref 4.0–10.5)

## 2015-04-29 MED ORDER — CETYLPYRIDINIUM CHLORIDE 0.05 % MT LIQD
7.0000 mL | Freq: Two times a day (BID) | OROMUCOSAL | Status: DC
  Administered 2015-04-29 – 2015-05-02 (×4): 7 mL via OROMUCOSAL

## 2015-04-29 MED ORDER — CHLORHEXIDINE GLUCONATE 0.12 % MT SOLN
15.0000 mL | Freq: Two times a day (BID) | OROMUCOSAL | Status: DC
Start: 2015-04-29 — End: 2015-05-02
  Administered 2015-04-29 – 2015-05-02 (×6): 15 mL via OROMUCOSAL
  Filled 2015-04-29 (×6): qty 15

## 2015-04-29 NOTE — Progress Notes (Signed)
1 Day Post-Op  Subjective: Mildly confused but admits to being very stubborn Denies nausea Family at bedside  Objective: Vital signs in last 24 hours: Temp:  [97.7 F (36.5 C)-99.1 F (37.3 C)] 98.7 F (37.1 C) (10/18 1130) Pulse Rate:  [68-77] 76 (10/18 1130) Resp:  [8-18] 18 (10/18 1130) BP: (110-138)/(41-63) 138/46 mmHg (10/18 1130) SpO2:  [90 %-100 %] 90 % (10/18 1130) Last BM Date: 04/27/15  Intake/Output from previous day: 10/17 0701 - 10/18 0700 In: 1400 [I.V.:1400] Out: 750 [Urine:675; Blood:75] Intake/Output this shift: Total I/O In: -  Out: 61 [Urine:100]  Well in appearance Lungs clear Abdomen soft, dressing dry  Lab Results:   Recent Labs  04/28/15 0714 04/29/15 0324  WBC 7.3 13.9*  HGB 10.8* 9.6*  HCT 35.5* 31.4*  PLT 403* 373   BMET  Recent Labs  04/28/15 0714 04/29/15 0324  NA 138 141  K 3.3* 3.2*  CL 98* 103  CO2 32 32  GLUCOSE 111* 116*  BUN 10 9  CREATININE 0.96 0.72  CALCIUM 9.1 8.4*   PT/INR  Recent Labs  04/28/15 0714  LABPROT 13.6  INR 1.02   ABG No results for input(s): PHART, HCO3 in the last 72 hours.  Invalid input(s): PCO2, PO2  Studies/Results: No results found.  Anti-infectives: Anti-infectives    Start     Dose/Rate Route Frequency Ordered Stop   04/28/15 0600  ceFAZolin (ANCEF) IVPB 2 g/50 mL premix     2 g 100 mL/hr over 30 Minutes Intravenous On call to O.R. 04/27/15 1307 04/28/15 0925   04/28/15 0600  metroNIDAZOLE (FLAGYL) IVPB 500 mg     500 mg 100 mL/hr over 60 Minutes Intravenous On call to O.R. 04/27/15 1307 04/28/15 0907      Assessment/Plan: s/p Procedure(s): Ileocecectomy (N/A)   Start clear liquids PT/OT consults regarding ambulation and needs Case manager consult.  Family is worried patient may need rehab or SNF  LOS: 1 day    Rajinder Mesick A 04/29/2015

## 2015-04-30 LAB — CBC
HEMATOCRIT: 29.4 % — AB (ref 36.0–46.0)
Hemoglobin: 9 g/dL — ABNORMAL LOW (ref 12.0–15.0)
MCH: 26.1 pg (ref 26.0–34.0)
MCHC: 30.6 g/dL (ref 30.0–36.0)
MCV: 85.2 fL (ref 78.0–100.0)
Platelets: 326 10*3/uL (ref 150–400)
RBC: 3.45 MIL/uL — ABNORMAL LOW (ref 3.87–5.11)
RDW: 15.9 % — AB (ref 11.5–15.5)
WBC: 8.7 10*3/uL (ref 4.0–10.5)

## 2015-04-30 MED ORDER — ACETAMINOPHEN 325 MG PO TABS
650.0000 mg | ORAL_TABLET | Freq: Four times a day (QID) | ORAL | Status: DC | PRN
  Administered 2015-04-30 (×2): 650 mg via ORAL
  Filled 2015-04-30 (×2): qty 2

## 2015-04-30 NOTE — Care Management Note (Signed)
Case Management Note  Patient Details  Name: Rachel Conner MRN: [id number] Date of Birth: [birth year]  Subjective/Objective:                    Action/Plan: Consult for  : Case manager consult. Family feels like patient may need SNF/rehab . SW consulted .   PT/OT consulted , will follow   Expected Discharge Date:  05/03/15               Expected Discharge Plan:     In-House Referral:     Discharge planning Services     Post Acute Care Choice:    Choice offered to:     DME Arranged:    DME Agency:     HH Arranged:    HH Agency:     Status of Service:  In process, will continue to follow  Medicare Important Message Given:    Date Medicare IM Given:    Medicare IM give by:    Date Additional Medicare IM Given:    Additional Medicare Important Message give by:     If discussed at [place] of Stay Meetings, dates discussed:    Additional Comments:  Marilu Favre, RN 04/30/2015, 8:28 AM

## 2015-04-30 NOTE — Evaluation (Signed)
Physical Therapy Evaluation Patient Details Name: Rachel Conner MRN: [id number] DOB: 1936/05/08 Today's Date: 04/30/2015   History of Present Illness  Patient s/p a recent lap converted to open appendectomy. 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 Pt underwent rt ileocolectomy on 10/17. PMH - HTN, CABG, DVT  Clinical Impression  Pt admitted with above diagnosis and presents to PT with functional limitations due to deficits listed below (See PT problem list). Pt needs skilled PT to maximize independence and safety to allow discharge to ST- SNF.      Follow Up Recommendations SNF    Equipment Recommendations  None recommended by PT    Recommendations for Other Services       Precautions / Restrictions Precautions Precautions: Fall      Mobility  Bed Mobility Overal bed mobility: Needs Assistance Bed Mobility: Supine to Sit     Supine to sit: Mod assist     General bed mobility comments: Assist to bring legs over and elevate trunk.  Transfers Overall transfer level: Needs assistance Equipment used: 4-wheeled walker Transfers: Sit to/from Stand Sit to Stand: Min assist         General transfer comment: Assist for balance and to bring hips up. Verbal cues for hand placement  Ambulation/Gait Ambulation/Gait assistance: Min assist Ambulation Distance (Feet): 100 Feet Assistive device: 4-wheeled walker Gait Pattern/deviations: Step-through pattern;Decreased stride length;Drifts right/left;Trunk flexed Gait velocity: At times too fast for safety   General Gait Details: Verbal cues to stay closer to walker. Pt with forward lean and pushing walker too far forward with incr gait speed. Assist for balance.  Stairs            Wheelchair Mobility    Modified Rankin (Stroke Patients Only)       Balance Overall balance assessment: Needs assistance Sitting-balance support: No upper extremity supported;Feet  supported Sitting balance-Leahy Scale: Fair     Standing balance support: Single extremity supported Standing balance-Leahy Scale: Poor Standing balance comment: Upper extremity support for static standing                             Pertinent Vitals/Pain Pain Assessment: Faces Faces Pain Scale: Hurts a little bit Pain Location: abdomen Pain Descriptors / Indicators: Sore Pain Intervention(s): Limited activity within patient's tolerance;Monitored during session;Repositioned    Home Living Family/patient expects to be discharged to:: Private residence Living Arrangements: Alone Available Help at Discharge: Family;Available PRN/intermittently Type of Home: House Home Access: Level entry     Home Layout: One level Home Equipment: Walker - 2 wheels;Cane - single point;Shower seat;Wheelchair - manual      Prior Function Level of Independence: Independent         Comments: per chart pt was having some confusion prior to and after recent surgery     Hand Dominance        Extremity/Trunk Assessment   Upper Extremity Assessment: Defer to OT evaluation           Lower Extremity Assessment: Generalized weakness         Communication   Communication: No difficulties  Cognition Arousal/Alertness: Awake/alert Behavior During Therapy: WFL for tasks assessed/performed Overall Cognitive Status: No family/caregiver present to determine baseline cognitive functioning Area of Impairment: Memory;Problem solving;Following commands;Safety/judgement     Memory: Decreased short-term memory Following Commands: Follows one step commands consistently Safety/Judgement: Decreased awareness of safety;Decreased awareness of deficits   Problem Solving: Slow  processing;Requires verbal cues      General Comments      Exercises        Assessment/Plan    PT Assessment Patient needs continued PT services  PT Diagnosis Difficulty walking;Generalized  weakness;Altered mental status   PT Problem List Decreased strength;Decreased activity tolerance;Decreased mobility;Decreased balance;Decreased cognition;Decreased knowledge of use of DME;Decreased safety awareness;Decreased knowledge of precautions;Obesity  PT Treatment Interventions DME instruction;Gait training;Functional mobility training;Therapeutic activities;Therapeutic exercise;Balance training;Cognitive remediation;Patient/family education   PT Goals (Current goals can be found in the Care Plan section) Acute Rehab PT Goals Patient Stated Goal: go home PT Goal Formulation: With patient Time For Goal Achievement: 05/14/15 Potential to Achieve Goals: Good    Frequency Min 3X/week   Barriers to discharge        Co-evaluation               End of Session Equipment Utilized During Treatment: Gait belt;Oxygen Activity Tolerance: Patient tolerated treatment well Patient left: in chair;with call bell/phone within reach;with nursing/sitter in room Freight forwarder) Nurse Communication: Mobility status         Time: 5093-2671 PT Time Calculation (min) (ACUTE ONLY): 25 min   Charges:   PT Evaluation $Initial PT Evaluation Tier I: 1 Procedure PT Treatments $Gait Training: 8-22 mins   PT G Codes:        Nafisa Olds 05/17/2015, 11:40 AM Drayce Tawil PT [phone]

## 2015-04-30 NOTE — Clinical Social Work Note (Signed)
CSW spoke with patient's son, Rachel Conner, regarding request for SNF placement at time of discharge. Per patient's son, patient's family interested in pursuing SNF placement due to patient's "current mental condition." Patient's chart reflects patient is ambulating 100 feet with minimal guard. Patient's son informed CSW patient's family is able to care for patient's physical needs at home, however, patient requesting Psych consult to be completed while patient remains inpatient at [facility] to assist patient's family with patient's current behaviors.  Patient to discharge home with patient's family providing supervision. RNCM updated. Charge RN updated regarding patient's son request for Psych consult.  CSW signing off.  [name], [place] Clinical Social Work Department Orthopedics [phone]) and Surgical [phone])

## 2015-04-30 NOTE — Progress Notes (Signed)
2 Days Post-Op  Subjective: Awake and alert Confused (sun downing)  Objective: Vital signs in last 24 hours: Temp:  [98.5 F (36.9 C)-101.7 F (38.7 C)] 101.7 F (38.7 C) (10/19 0645) Pulse Rate:  [71-96] 96 (10/19 0645) Resp:  [16-20] 20 (10/19 0645) BP: (119-138)/(43-94) 120/52 mmHg (10/19 0645) SpO2:  [68 %-94 %] 88 % (10/19 0645) Last BM Date: 04/27/15  Intake/Output from previous day: 10/18 0701 - 10/19 0700 In: 1359 [P.O.:50; I.V.:1309] Out: 175 [Urine:175] Intake/Output this shift:    Abdomen soft, non tender, incision clean  Lab Results:   Recent Labs  04/28/15 0714 04/29/15 0324  WBC 7.3 13.9*  HGB 10.8* 9.6*  HCT 35.5* 31.4*  PLT 403* 373   BMET  Recent Labs  04/28/15 0714 04/29/15 0324  NA 138 141  K 3.3* 3.2*  CL 98* 103  CO2 32 32  GLUCOSE 111* 116*  BUN 10 9  CREATININE 0.96 0.72  CALCIUM 9.1 8.4*   PT/INR  Recent Labs  04/28/15 0714  LABPROT 13.6  INR 1.02   ABG No results for input(s): PHART, HCO3 in the last 72 hours.  Invalid input(s): PCO2, PO2  Studies/Results: No results found.  Anti-infectives: Anti-infectives    Start     Dose/Rate Route Frequency Ordered Stop   04/28/15 0600  ceFAZolin (ANCEF) IVPB 2 g/50 mL premix     2 g 100 mL/hr over 30 Minutes Intravenous On call to O.R. 04/27/15 1307 04/28/15 0925   04/28/15 0600  metroNIDAZOLE (FLAGYL) IVPB 500 mg     500 mg 100 mL/hr over 60 Minutes Intravenous On call to O.R. 04/27/15 1307 04/28/15 0907      Assessment/Plan: s/p Procedure(s): Ileocecectomy (N/A)  Check CBC given fever Needs to work on IS Case manager consult.  Family feels like patient may need SNF/rehab   LOS: 2 days    Tyrion Glaude A 04/30/2015

## 2015-04-30 NOTE — Plan of Care (Signed)
Problem: Phase II Progression Outcomes Goal: Vital signs stable Able use IS, got up to 1750

## 2015-05-01 MED ORDER — HYDROCODONE-ACETAMINOPHEN 5-325 MG PO TABS
1.0000 | ORAL_TABLET | ORAL | Status: DC | PRN
  Administered 2015-05-03: 2 via ORAL
  Filled 2015-05-01: qty 2

## 2015-05-01 MED ORDER — MORPHINE SULFATE (PF) 2 MG/ML IV SOLN: 1.0000 mg | INTRAVENOUS | Status: DC | PRN

## 2015-05-01 MED ORDER — RIVAROXABAN 20 MG PO TABS
20.0000 mg | ORAL_TABLET | Freq: Every day | ORAL | Status: DC
  Administered 2015-05-01 – 2015-05-03 (×3): 20 mg via ORAL
  Filled 2015-05-01 (×3): qty 1

## 2015-05-01 NOTE — Discharge Instructions (Addendum)
START TAKING [place]  ON MONDAY  Information on my medicine - XARELTO (rivaroxaban)  This medication education was reviewed with me or my healthcare representative as part of my discharge preparation.  The pharmacist that spoke with me during my hospital stay was:  Romona Curls, Meyer? Xarelto was prescribed to treat blood clots that may have been found in the veins of your legs (deep vein thrombosis) or in your lungs (pulmonary embolism) and to reduce the risk of them occurring again.  What do you need to know about Xarelto? Your dose is now one 20 mg tablet taken ONCE A DAY with your evening meal.  DO NOT stop taking Xarelto without talking to the health care provider who prescribed the medication.  Refill your prescription for 20 mg tablets before you run out.  After discharge, you should have regular check-up appointments with your healthcare provider that is prescribing your Xarelto.  In the future your dose may need to be changed if your kidney function changes by a significant amount.  What do you do if you miss a dose? If you are taking Xarelto TWICE DAILY and you miss a dose, take it as soon as you remember. You may take two 15 mg tablets (total 30 mg) at the same time then resume your regularly scheduled 15 mg twice daily the next day.  If you are taking Xarelto ONCE DAILY and you miss a dose, take it as soon as you remember on the same day then continue your regularly scheduled once daily regimen the next day. Do not take two doses of Xarelto at the same time.   Important Safety Information Xarelto is a blood thinner medicine that can cause bleeding. You should call your healthcare provider right away if you experience any of the following: ? Bleeding from an injury or your nose that does not stop. ? Unusual colored urine (red or dark Mcclarty) or unusual colored stools (red or black). ? Unusual bruising for unknown reasons. ? A serious  fall or if you hit your head (even if there is no bleeding).  Some medicines may interact with Xarelto and might increase your risk of bleeding while on Xarelto. To help avoid this, consult your healthcare provider or pharmacist prior to using any new prescription or non-prescription medications, including herbals, vitamins, non-steroidal anti-inflammatory drugs (NSAIDs) and supplements.  This website has more information on Xarelto: [email or web address]   [facility], [place] [phone]  OPEN ABDOMINAL SURGERY: POST OP INSTRUCTIONS  Always review your discharge instruction sheet given to you by the facility where your surgery was performed.  IF YOU HAVE DISABILITY OR FAMILY LEAVE FORMS, YOU MUST BRING THEM TO THE OFFICE FOR PROCESSING.  PLEASE DO NOT GIVE THEM TO YOUR DOCTOR.  1. A prescription for pain medication may be given to you upon discharge.  Take your pain medication as prescribed, if needed.  If narcotic pain medicine is not needed, then you may take acetaminophen (Tylenol) or ibuprofen (Advil) as needed. 2. Take your usually prescribed medications unless otherwise directed. 3. If you need a refill on your pain medication, please contact your pharmacy. They will contact our office to request authorization.  Prescriptions will not be filled after 5pm or on week-ends. 4. You should follow a light diet the first few days after arrival home, such as soup and crackers, pudding, etc.unless your doctor has advised otherwise. A high-fiber, low fat diet can  be resumed as tolerated.   Be sure to include lots of fluids daily. Most patients will experience some swelling and bruising on the chest and neck area.  Ice packs will help.  Swelling and bruising can take several days to resolve 5. Most patients will experience some swelling and bruising in the area of the incision. Ice pack will help. Swelling and bruising can take several days to resolve..  6. It is common to  experience some constipation if taking pain medication after surgery.  Increasing fluid intake and taking a stool softener will usually help or prevent this problem from occurring.  A mild laxative (Milk of Magnesia or Miralax) should be taken according to package directions if there are no bowel movements after 48 hours. 7.  You may have steri-strips (small skin tapes) in place directly over the incision.  These strips should be left on the skin for 7-10 days.  If your surgeon used skin glue on the incision, you may shower in 24 hours.  The glue will flake off over the next 2-3 weeks.  Any sutures or staples will be removed at the office during your follow-up visit. You may find that a light gauze bandage over your incision may keep your staples from being rubbed or pulled. You may shower and replace the bandage daily. 8. ACTIVITIES:  You may resume regular (light) daily activities beginning the next day--such as daily self-care, walking, climbing stairs--gradually increasing activities as tolerated.  You may have sexual intercourse when it is comfortable.  Refrain from any heavy lifting or straining until approved by your doctor. a. You may drive when you no longer are taking prescription pain medication, you can comfortably wear a seatbelt, and you can safely maneuver your car and apply brakes b. Return to Work: ___________________________________ 38. You should see your doctor in the office for a follow-up appointment approximately two weeks after your surgery.  Make sure that you call for this appointment within a day or two after you arrive home to insure a convenient appointment time. OTHER INSTRUCTIONS:  _____________________________________________________________ _____________________________________________________________  WHEN TO CALL YOUR DOCTOR: 1. Fever over 101.0 2. Inability to urinate 3. Nausea and/or vomiting 4. Extreme swelling or bruising 5. Continued bleeding from  incision. 6. Increased pain, redness, or drainage from the incision. 7. Difficulty swallowing or breathing 8. Muscle cramping or spasms. 9. Numbness or tingling in hands or feet or around lips.  The clinic staff is available to answer your questions during regular business hours.  Please dont hesitate to call and ask to speak to one of the nurses if you have concerns.  For further questions, please visit [email or web address]

## 2015-05-01 NOTE — Progress Notes (Signed)
3 Days Post-Op  Subjective: Awake and alert No complaints Denies nausea Denies flatus  Objective: Vital signs in last 24 hours: Temp:  [98.8 F (37.1 C)-100.1 F (37.8 C)] 99.2 F (37.3 C) (10/20 0853) Pulse Rate:  [77-82] 78 (10/20 0853) Resp:  [11-18] 16 (10/20 0853) BP: (99-136)/(49-86) 99/86 mmHg (10/20 0853) SpO2:  [94 %-99 %] 94 % (10/20 0853) Last BM Date: 04/27/15  Intake/Output from previous day: 10/19 0701 - 10/20 0700 In: 2475 [I.V.:2475] Out: 400 [Urine:400] Intake/Output this shift:    Lungs clear Abdomen soft, NT , ND, incision clean  Lab Results:   Recent Labs  04/29/15 0324 04/30/15 0825  WBC 13.9* 8.7  HGB 9.6* 9.0*  HCT 31.4* 29.4*  PLT 373 326   BMET  Recent Labs  04/29/15 0324  NA 141  K 3.2*  CL 103  CO2 32  GLUCOSE 116*  BUN 9  CREATININE 0.72  CALCIUM 8.4*   PT/INR No results for input(s): LABPROT, INR in the last 72 hours. ABG No results for input(s): PHART, HCO3 in the last 72 hours.  Invalid input(s): PCO2, PO2  Studies/Results: No results found.  Anti-infectives: Anti-infectives    Start     Dose/Rate Route Frequency Ordered Stop   04/28/15 0600  ceFAZolin (ANCEF) IVPB 2 g/50 mL premix     2 g 100 mL/hr over 30 Minutes Intravenous On call to O.R. 04/27/15 1307 04/28/15 0925   04/28/15 0600  metroNIDAZOLE (FLAGYL) IVPB 500 mg     500 mg 100 mL/hr over 60 Minutes Intravenous On call to O.R. 04/27/15 1307 04/28/15 0907      Assessment/Plan: s/p Procedure(s): Ileocecectomy (N/A)  Pathology showed negative margins with 1 out of 5 nodes positive.  I discussed this with her daughter in the room We also discussed her mental status.  She has actually be having issues for about 2 years that sound like the onset of dementia.  On further discussion, I and her daughter do not feel like she has a psychiatric issue.  Will hold on Psych consult. Will advance diet and start PO Family is now wanting her to go home so will  start making home health arrangements. She is still several days away from discharge   LOS: 3 days    Rachel Conner A 05/01/2015

## 2015-05-01 NOTE — Care Management Note (Signed)
Case Management Note  Patient Details  Name: Rachel Conner MRN: [id number] Date of Birth: 1936/05/07  Subjective/Objective:                    Action/Plan:  Spoke to patient's son Thurza Kwiecinski , confirmed family do want to take patient home at discharge .  Patient discharging address is [address] [place]  [address]   Phone [phone]   Emailed Mr Maret list of home health agencies , he will discuss with his sister and decide on one. Mr Hoppes has my contact information and will call back with decision.  Expected Discharge Date:  05/03/15               Expected Discharge Plan:  [place]  In-House Referral:     Discharge planning Services  CM Consult  Post Acute Care Choice:  Home Health Choice offered to:  Adult Children  DME Arranged:    DME Agency:     HH Arranged:    HH Agency:     Status of Service:  In process, will continue to follow  Medicare Important Message Given:    Date Medicare IM Given:    Medicare IM give by:    Date Additional Medicare IM Given:    Additional Medicare Important Message give by:     If discussed at [place] of Stay Meetings, dates discussed:    Additional Comments:  Marilu Favre, RN 05/01/2015, 11:57 AM

## 2015-05-01 NOTE — Evaluation (Signed)
Occupational Therapy Evaluation Patient Details Name: Rachel Conner MRN: [id number] DOB: [birth year] Today's Date: 05/01/2015    History of Present Illness Patient s/p a recent lap converted to open appendectomy. 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 Pt underwent rt ileocolectomy on 10/17. PMH - HTN, CABG, DVT   Clinical Impression   Pt admitted as above currently presenting with deficits (see OT problem list below) impacting her level of independence with ADl's and functional mobility. She will benefit from acute OT to address deficits and assist with maximizing independence in preparation for d/c to next venue. Recommend SNF/24/7 PRN assist at this time.    Follow Up Recommendations  SNF;Supervision/Assistance - 24 hour    Equipment Recommendations  Other (comment) (Defer to next venue)    Recommendations for Other Services       Precautions / Restrictions Precautions Precautions: Fall Restrictions Weight Bearing Restrictions: No      Mobility Bed Mobility Overal bed mobility: Needs Assistance Bed Mobility: Sidelying to Sit;Rolling;Supine to Sit;Sit to Supine Rolling: Mod assist Sidelying to sit: Min assist Supine to sit: Mod assist Sit to supine: Mod assist   General bed mobility comments: Assist to bring legs over and elevate trunk.  Transfers Overall transfer level: Needs assistance Equipment used: Rolling walker (2 wheeled) Transfers: Sit to/from [facility] Sit to Stand: Min assist Stand pivot transfers: Min assist       General transfer comment: Assist for balance and to bring hips up. Verbal cues for hand placement    Balance Overall balance assessment: Needs assistance Sitting-balance support: Feet supported;No upper extremity supported Sitting balance-Leahy Scale: Fair     Standing balance support: Single extremity supported Standing balance-Leahy Scale: Poor Standing balance  comment: Upper extremity support for static standing. As pt ambulates, she was noted to reach out for doorway, grab bars etc... fatigues easily                            ADL Overall ADL's : Needs assistance/impaired Eating/Feeding: Set up;Sitting   Grooming: Wash/dry hands;Wash/dry face;Standing;Supervision/safety;Min guard   Upper Body Bathing: Set up;Sitting;Min guard   Lower Body Bathing: Moderate assistance;Sit to/from stand;Set up   Upper Body Dressing : Min guard;Supervision/safety;Sitting   Lower Body Dressing: Moderate assistance;Sit to/from stand   Toilet Transfer: Min guard;Supervision/safety;Ambulation;Comfort height toilet;Grab bars;RW   Toileting- Clothing Manipulation and Hygiene: Supervision/safety;Sit to/from stand;Min guard       Functional mobility during ADLs: Min guard;Cueing for safety General ADL Comments: Pt was educated in Role of OT and was agreeable to brief OT retraining session for functional mobility in her room, bathroom - stood to groom at sink and sat in recliner ~4-5 min before requested getting back to bad. Pt was educated in importance of incresing her activity tolerance and endurance to assist in maximizing independence and return to PLOF as able. She verbalized understanding of this. Pt was encouraged to sit up to eat lunch and walk w/ RN staff later today as able.     Vision  Glasses for reading; no change from baseline   Perception     Praxis      Pertinent Vitals/Pain Pain Assessment: Faces Faces Pain Scale: Hurts a little bit Pain Location: Abdomen Pain Descriptors / Indicators: Sore Pain Intervention(s): Limited activity within patient's tolerance;Monitored during session;Repositioned     Hand Dominance Right   Extremity/Trunk Assessment Upper Extremity Assessment Upper Extremity Assessment: Overall WFL for  tasks assessed;Generalized weakness   Lower Extremity Assessment Lower Extremity Assessment: Generalized  weakness       Communication Communication Communication: No difficulties   Cognition Arousal/Alertness: Awake/alert Behavior During Therapy: WFL for tasks assessed/performed Overall Cognitive Status: No family/caregiver present to determine baseline cognitive functioning Area of Impairment: Memory;Problem solving;Following commands;Safety/judgement     Memory: Decreased short-term memory Following Commands: Follows one step commands consistently Safety/Judgement: Decreased awareness of safety;Decreased awareness of deficits   Problem Solving: Slow processing;Requires verbal cues;Decreased initiation     General Comments       Exercises       Shoulder Instructions      Home Living Family/patient expects to be discharged to:: Private residence Living Arrangements: Alone Available Help at Discharge: Family;Available PRN/intermittently Type of Home: House Home Access: Level entry     Home Layout: One level     Bathroom Shower/Tub: Teacher, early years/pre: Standard     Home Equipment: Environmental consultant - 2 wheels;Cane - single point;Shower seat;Wheelchair - manual          Prior Functioning/Environment Level of Independence: Independent        Comments: per chart pt was having some confusion prior to and after recent surgery    OT Diagnosis: Generalized weakness;Acute pain   OT Problem List: Decreased strength;Decreased activity tolerance;Decreased knowledge of use of DME or AE;Decreased safety awareness;Pain;Obesity   OT Treatment/Interventions: Self-care/ADL training;Therapeutic exercise;DME and/or AE instruction;Patient/family education;Therapeutic activities    OT Goals(Current goals can be found in the care plan section) Acute Rehab OT Goals Patient Stated Goal: go home OT Goal Formulation: With patient Time For Goal Achievement: 05/15/15 Potential to Achieve Goals: Good  OT Frequency: Min 2X/week   Barriers to D/C: Other (comment) (LIVES  alone)          Co-evaluation              End of Session Equipment Utilized During Treatment: Gait belt;Rolling walker Nurse Communication: Mobility status;Other (comment) (Encourage pt to sit up in chair and ambulate with staff as able )  Activity Tolerance: Patient limited by fatigue Patient left: in bed;with call bell/phone within reach;with nursing/sitter in room   Time: 1005-1035 OT Time Calculation (min): 30 min Charges:  OT General Charges $OT Visit: 1 Procedure OT Evaluation $Initial OT Evaluation Tier I: 1 Procedure OT Treatments $Self Care/Home Management : 8-22 mins G-Codes: OT G-codes **NOT FOR INPATIENT CLASS** Functional Assessment Tool Used: Cllinical Judgement Functional Limitation: Self care Self Care Current Status (K9326): At least 40 percent but less than 60 percent impaired, limited or restricted Self Care Goal Status (Z1245): At least 1 percent but less than 20 percent impaired, limited or restricted  Almyra Deforest, OTR/L 05/01/2015, 10:48 AM

## 2015-05-02 NOTE — Progress Notes (Addendum)
Pt accidentally pulled IV out and  refused IV to be restarted.

## 2015-05-02 NOTE — Progress Notes (Signed)
4 Days Post-Op  Subjective: Pleasant, but still requiring a sitter Having BM's Tolerating Full Liquids  Objective: Vital signs in last 24 hours: Temp:  [98.8 F (37.1 C)-99.5 F (37.5 C)] 99 F (37.2 C) (10/21 0612) Pulse Rate:  [84-88] 88 (10/21 0612) Resp:  [16-19] 19 (10/21 0612) BP: (127-144)/(53-54) 127/53 mmHg (10/21 0612) SpO2:  [96 %] 96 % (10/21 0612) Last BM Date: 05/01/15  Intake/Output from previous day: 10/20 0701 - 10/21 0700 In: 240 [P.O.:240] Out: 200 [Urine:200] Intake/Output this shift: Total I/O In: 300 [P.O.:300] Out: -   Awake and alert Lungs clear Abdomen soft, NT  Lab Results:   Recent Labs  04/30/15 0825  WBC 8.7  HGB 9.0*  HCT 29.4*  PLT 326   BMET No results for input(s): NA, K, CL, CO2, GLUCOSE, BUN, CREATININE, CALCIUM in the last 72 hours. PT/INR No results for input(s): LABPROT, INR in the last 72 hours. ABG No results for input(s): PHART, HCO3 in the last 72 hours.  Invalid input(s): PCO2, PO2  Studies/Results: No results found.  Anti-infectives: Anti-infectives    Start     Dose/Rate Route Frequency Ordered Stop   04/28/15 0600  ceFAZolin (ANCEF) IVPB 2 g/50 mL premix     2 g 100 mL/hr over 30 Minutes Intravenous On call to O.R. 04/27/15 1307 04/28/15 0925   04/28/15 0600  metroNIDAZOLE (FLAGYL) IVPB 500 mg     500 mg 100 mL/hr over 60 Minutes Intravenous On call to O.R. 04/27/15 1307 04/28/15 0907      Assessment/Plan: s/p Procedure(s): Ileocecectomy (N/A)  Will leave IV out Advancing diet Home health being arranged. Hopefully discharge in next 1 to 2 days  LOS: 4 days    Walida Cajas A 05/02/2015

## 2015-05-02 NOTE — Discharge Summary (Signed)
Physician Discharge Summary  Patient ID: Rachel Conner MRN: [id number] DOB/AGE: 77-May-1937 77 y.o.  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  She has also been having issues with worsening dementia.  She actually had an uneventful postoperative recovery with some sundowning and mild to moderate confusion.  PT, OT, and case management were asked to evaluate [name].  Her bowel function returned and her diet was advanced.  Her family initially wanted her to come home, but after further evaluation, SNF seemed much more appropriate.  The final pathology showed negative margins but 1 out of 5 nodes positive for cancer.  Plan will be to arrange for oncology followup as an outpatient  Consults: None  Significant Diagnostic Studies:   Treatments: surgery: right partial colectomy  Discharge Exam: Blood pressure 145/56, pulse 80, temperature 97.8 F (36.6 C), temperature source Oral, resp. rate 18, height 5\' 5"  (1.651 m), weight 98.998 kg (218 lb 4 oz), SpO2 96 %. General appearance: alert, cooperative and no distress Resp: clear to auscultation bilaterally Cardio: regular rate and rhythm, S1, S2 normal, no murmur, click, rub or gallop Incision/Wound: abdomen soft, incision clean Neuro:  Awake and alert, follow commands but confused easily  Disposition: SNF    Medication List    TAKE these medications        ALPRAZolam 1 MG tablet  Commonly known as:  XANAX  Take 1 tablet by mouth 3 (three) times daily as needed.     aspirin 325 MG tablet  Take 325 mg by mouth daily.     esomeprazole 20 MG capsule  Commonly known as:  NEXIUM  Take 20 mg by mouth daily at 12 noon.     levothyroxine  100 MCG tablet  Commonly known as:  SYNTHROID, LEVOTHROID  Take 100 mcg by mouth daily.     losartan-hydrochlorothiazide 50-12.5 MG tablet  Commonly known as:  HYZAAR  TAKE 1 TABLET DAILY     ondansetron 4 MG disintegrating tablet  Commonly known as:  ZOFRAN ODT  Take 1 tablet (4 mg total) by mouth every 8 (eight) hours as needed for nausea or vomiting.     promethazine 25 MG tablet  Commonly known as:  PHENERGAN  Take 1 tablet (25 mg total) by mouth every 8 (eight) hours as needed for nausea or vomiting.     rivaroxaban 20 MG Tabs tablet  Commonly known as:  XARELTO  Take 20 mg by mouth daily.     rosuvastatin 20 MG tablet  Commonly known as:  CRESTOR  Take 20 mg by mouth daily.     Vitamin D-3 5000 UNITS Tabs  Take 10,000 Units by mouth.           Follow-up Information    Follow up with [facility] A, MD. Schedule an appointment as soon as possible for a visit in 1 week.   Specialty:  General Surgery   Why:  For suture removal.  can be nurses only visit   Contact information:   [address] [place] [address] [phone]       Signed: Harl Bowie 05/02/2015, 2:08 PM

## 2015-05-02 NOTE — Clinical Social Work Placement (Signed)
   CLINICAL SOCIAL WORK PLACEMENT  NOTE  Date:  05/02/2015  Patient Details  Name: Rachel Conner MRN: [id number] Date of Birth: Sep 02, 1935  Clinical Social Work is seeking post-discharge placement for this patient at the [place] level of care (*CSW will initial, date and re-position this form in  chart as items are completed):  Yes   Patient/family provided with [facility] Work Department's list of facilities offering this level of care within the geographic area requested by the patient (or if unable, by the patient's family).  Yes   Patient/family informed of their freedom to choose among providers that offer the needed level of care, that participate in Medicare, Medicaid or managed care program needed by the patient, have an available bed and are willing to accept the patient.  Yes   Patient/family informed of [facility]'s ownership interest in [facility] and [facility], as well as of the fact that they are under no obligation to receive care at these facilities.  PASRR submitted to EDS on 05/02/15     PASRR number received on 05/02/15     Existing PASRR number confirmed on  (n/a)     FL2 transmitted to all facilities in geographic area requested by pt/family on 05/02/15     FL2 transmitted to all facilities within larger geographic area on 05/02/15     Patient informed that his/her managed care company has contracts with or will negotiate with certain facilities, including the following:   (yes)         Patient/family informed of bed offers received.  Patient chooses bed at       Physician recommends and patient chooses bed at      Patient to be transferred to   on  .  Patient to be transferred to facility by       Patient family notified on   of transfer.  Name of family member notified:        PHYSICIAN Please sign FL2     Additional Comment:    _______________________________________________ Caroline Sauger,  LCSW 05/02/2015, 12:02 PM

## 2015-05-02 NOTE — Care Management (Addendum)
Spoke to patient's daughter Sula Soda .  Sula Soda and Pieter Partridge have decided that their mother needs to go to short term SNF . Will make SW aware.   Explained to Sula Soda that per MD note patient will be medically ready for discharge in 1 to 2 days , so decision will have to be made soon .   Magdalen Spatz RN BSN [phone]

## 2015-05-02 NOTE — Progress Notes (Signed)
Physical Therapy Treatment Patient Details Name: Rachel Conner MRN: [id number] DOB: 04/09/1936 Today's Date: 05/02/2015    History of Present Illness Patient s/p a recent lap converted to open appendectomy. 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 Pt underwent rt ileocolectomy on 10/17. PMH - HTN, CABG, DVT    PT Comments    The pt is progressing well with her mobility.  Mentally clear for session (generally, not specifically tested as far as orientation, etc, but conversation normal).  O2 sats remained stable on RA during gait with no signs of DOE.   PT to follow acutely for deficits listed below.     Follow Up Recommendations  SNF     Equipment Recommendations  None recommended by PT    Recommendations for Other Services   NA     Precautions / Restrictions Precautions Precautions: Fall Precaution Comments: mildly unsteady on her feet without AD, she at baseline, doens't use an assistive device.     Mobility  Bed Mobility Overal bed mobility: Needs Assistance Bed Mobility: Rolling;Sidelying to Sit Rolling: Min guard Sidelying to sit: Supervision       General bed mobility comments: Min guard hand over hand assist to help pt find and use the bed rail.  Supervision for her to progress her legs over EOB and push to sit up.   Transfers Overall transfer level: Needs assistance Equipment used: Rolling walker (2 wheeled) Transfers: Sit to/from Stand Sit to Stand: Min guard         General transfer comment: Min guard assist for safety.  Verbal cues to wait for RW.   Ambulation/Gait Ambulation/Gait assistance: Min guard Ambulation Distance (Feet): 200 Feet Assistive device: Rolling walker (2 wheeled) Gait Pattern/deviations: Step-through pattern;Shuffle;Staggering left;Staggering right Gait velocity: decreased Gait velocity interpretation: Below normal speed for age/gender General Gait Details: Verbal cues for safe use  of RW.  Pt reports she uses no assistive device at baseline, so this is a novel task.  Pt reports no DOE during gait and O2 sats remain in the low 90s throughout session on RA.           Balance Overall balance assessment: Needs assistance Sitting-balance support: Feet supported;No upper extremity supported Sitting balance-Leahy Scale: Good     Standing balance support: Single extremity supported;Bilateral upper extremity supported;No upper extremity supported Standing balance-Leahy Scale: Fair                      Cognition Arousal/Alertness: Awake/alert Behavior During Therapy: WFL for tasks assessed/performed                   General Comments: RN reports she "is doing well" right now, re: cognition. She has been having some trouble at night and in the evening.             Pertinent Vitals/Pain Pain Assessment: No/denies pain           PT Goals (current goals can now be found in the care plan section) Acute Rehab PT Goals Patient Stated Goal: go home Progress towards PT goals: Progressing toward goals    Frequency  Min 3X/week    PT Plan Current plan remains appropriate       End of Session Equipment Utilized During Treatment: Gait belt Activity Tolerance: Patient tolerated treatment well Patient left: in chair;with call bell/phone within reach;with nursing/sitter in room     Time: 1310-1323 PT Time Calculation (min) (ACUTE ONLY): 13 min  Charges:  $Gait Training: 8-22 mins                     Keatyn Luck B. Rhyanna Sorce, [place], DPT [phone]   05/02/2015, 1:59 PM

## 2015-05-02 NOTE — Care Management Important Message (Signed)
Important Message  Patient Details  Name: Rachel Conner MRN: [id number] Date of Birth: 11-Sep-1935   Medicare Important Message Given:  Yes-second notification given    Nathen May 05/02/2015, 12:35 PM

## 2015-05-02 NOTE — Clinical Social Work Note (Signed)
Clinical Social Work Assessment  Patient Details  Name: Rachel Conner MRN: [id number] Date of Birth: 02-Sep-1935  Date of referral:  05/02/15               Reason for consult:  Facility Placement, Discharge Planning                Permission sought to share information with:  Facility Sport and exercise psychologist, Family Supports Permission granted to share information::  Yes, Verbal Permission Granted  Name::     [name]  Agency::  [facility]  Relationship::  Adult Child (daughter)  Contact Information:  [phone]  Housing/Transportation Living arrangements for the past 2 months:  [place] of Information:  Adult Children [name] (daughter) and Jasmia Angst (son, see previous note in Epic)) Patient Interpreter Needed:  None Criminal Activity/Legal Involvement Pertinent to Current Situation/Hospitalization:  No - Comment as needed Significant Relationships:  Adult Children Lives with:  Self Do you feel safe going back to the place where you live?  No (Patient's family concerned family is unable to care for patient at home.) Need for family participation in patient care:  Yes (Comment) (Patient alert to self only. Patient's daughter/son dual HCPOA.)  Care giving concerns:  Patient's family expressed concern for patient to return home once medically stable for discharge. Per patient's daughter, patient's family agreeable to care for patient at home post-short term SNF placement.   Social Worker assessment / plan:  CSW received referral for possible SNF placement at time of discharge. CSW initially spoke with patient's son, Pieter Partridge, regarding discharge disposition for patient (see previous note in Epic). Per patient's family, patient's family agreeable to patient returning home with family providing care to patient. CSW had signed off patient's case. CSW updated on 05/02/2015 by [facility] that patient's family now expressing concern for  patient's ability to care for patient at time of discharge. CSW spoke with patient's daughter, Jeani Hawking, regarding concerns. Per patient's daughter, patient's family would prefer for patient to be placed in a SNF for short-term prior to returning home with family. Patient's daughter expressed interest in [facility] ([facility]) and [facility]). Patient's daughter agreeable to CSW expanding SNF search to [facility], [facility], and named facility in [place]. CSW to continue to follow and assist with discharge planning needs.  Employment status:  Retired Forensic scientist:  Medicare PT Recommendations:  [place] / Referral to community resources:  [place]  Patient/Family's Response to care:  Patient's family understanding and agreeable to CSW plan of care.  Patient/Family's Understanding of and Emotional Response to Diagnosis, Current Treatment, and Prognosis:  Patient's family understanding of patient's current diagnosis, treatment, and prognosis. Patient's daughter expressed feelings of family being overwhelmed as patient's current mental state is acute to this hospital stay.  Emotional Assessment Appearance:  Appears stated age Attitude/Demeanor/Rapport:  Other (Patient alert to self only. CSW spoke with patient's daughter and son.) Affect (typically observed):  Other (Patient alert to self only. CSW spoke with patient's daughter and son.) Orientation:  Oriented to Self Alcohol / Substance use:  Not Applicable Psych involvement (Current and /or in the community):  No (Comment) (Not appropriate on this admission.)  Discharge Needs  Concerns to be addressed:  No discharge needs identified Readmission within the last 30 days:  No Current discharge risk:  None Barriers to Discharge:  No Barriers Identified   Caroline Sauger, LCSW 05/02/2015,  11:25 AM [phone]

## 2015-05-03 MED ORDER — HYDROCODONE-ACETAMINOPHEN 5-325 MG PO TABS: 1.0000 | ORAL_TABLET | ORAL | Status: DC | PRN

## 2015-05-03 NOTE — Care Management Note (Signed)
Case Management Note  Patient Details  Name: Rachel Conner MRN: [id number] Date of Birth: November 02, 1935  Subjective/Objective:                  Chief Complaint: cancer of the appendix  Action/Plan: CM spoke to patient and daughter at the bedside regarding discharge plan and both state that they no longer want SNF placement and want to go home with [facility] services. CM offered choice of agency and daughter, Minerva Ends @ [phone] states that she would like [facility]. Pt agreeable. CM and pt/family spoke about needs and felt that PT/OT/RN/aide would be needed. Daughter said that she and her cousin and aunts would be able to provide 24/7 care for the patient. They deny need for hospital bed. Pt has Wheelchair, RW, 3N1 and denies any further DME needs. CM call and spoke with tiffany with [facility] and advised of [place] needs for PT/OT/RN/Aide and referral accepted. CM remains available should additional needs arise.   Expected Discharge Date:  05/03/15               Expected Discharge Plan:  [place]  In-House Referral:     Discharge planning Services  CM Consult  Post Acute Care Choice:  Home Health Choice offered to:  Adult Children, Patient  DME Arranged:    DME Agency:     HH Arranged:  RN, PT, OT, Nurse's Aide [place] Agency:  [facility]  Status of Service:  Completed, signed off  Medicare Important Message Given:  Yes-second notification given Date Medicare IM Given:    Medicare IM give by:    Date Additional Medicare IM Given:    Additional Medicare Important Message give by:     If discussed at [place] of Stay Meetings, dates discussed:    Additional Comments:  [name], RN 05/03/2015, 12:05 PM

## 2015-05-03 NOTE — Progress Notes (Signed)
LCSW met with patient daughter and grandson at the bedside. Daughter reports she no longer wants patient to go to rehab facility that she would prefer home with family support and home health.  Patient remains to have a sitter for safety, but patient up walking around room and taking herself to the bathroom. Patient agreeable with plan.  CM aware of [facility] needed and [facility] as first choice by family.   No other SW needs.  DC home with HH.  Lane Hacker, MSW Clinical Social Work: Emergency Room [phone]

## 2015-05-03 NOTE — Progress Notes (Signed)
Prn mylanta administered for c/o gas/bloating

## 2015-05-03 NOTE — Progress Notes (Signed)
MD working on pt discharge. Staples to remain in place on discharge and will be removed in clinic when she comes for follow up

## 2015-05-03 NOTE — Progress Notes (Signed)
5 Days Post-Op  Subjective: Pleasant, but still requiring a sitter Having BM's Tolerating diet  Objective: Vital signs in last 24 hours: Temp:  [97.8 F (36.6 C)-99.1 F (37.3 C)] 98.7 F (37.1 C) (10/22 0618) Pulse Rate:  [70-81] 70 (10/22 0618) Resp:  [16-18] 16 (10/22 0618) BP: (121-145)/(50-98) 138/50 mmHg (10/22 0618) SpO2:  [91 %-97 %] 97 % (10/22 0618) Last BM Date: 05/02/15  Intake/Output from previous day: 10/21 0701 - 10/22 0700 In: 600 [P.O.:600] Out: 200 [Urine:200] Intake/Output this shift:    Awake and alert Lungs clear Abdomen soft, NT Incision clean, dry, intact  Lab Results:  No results for input(s): WBC, HGB, HCT, PLT in the last 72 hours. BMET No results for input(s): NA, K, CL, CO2, GLUCOSE, BUN, CREATININE, CALCIUM in the last 72 hours. PT/INR No results for input(s): LABPROT, INR in the last 72 hours. ABG No results for input(s): PHART, HCO3 in the last 72 hours.  Invalid input(s): PCO2, PO2  Studies/Results: No results found.  Anti-infectives: Anti-infectives    Start     Dose/Rate Route Frequency Ordered Stop   04/28/15 0600  ceFAZolin (ANCEF) IVPB 2 g/50 mL premix     2 g 100 mL/hr over 30 Minutes Intravenous On call to O.R. 04/27/15 1307 04/28/15 0925   04/28/15 0600  metroNIDAZOLE (FLAGYL) IVPB 500 mg     500 mg 100 mL/hr over 60 Minutes Intravenous On call to O.R. 04/27/15 1307 04/28/15 0907      Assessment/Plan: s/p Procedure(s): Ileocecectomy (N/A)  Will leave IV out Cont diet SNF being arranged  Hopefully discharge today if bed available   LOS: 5 days    Rebbeca Sheperd C. [id number]

## 2015-05-03 NOTE — Progress Notes (Signed)
Pt discharged home with home health. Family provided transportation. Pt condition stable on discharge

## 2015-05-03 NOTE — Progress Notes (Signed)
Pt scheduled for discharge home this pm with home health

## 2015-05-03 NOTE — Progress Notes (Signed)
Prn zofran administered for c/o nausea

## 2015-05-09 ENCOUNTER — Emergency Department (HOSPITAL_COMMUNITY)
Admission: EM | Admit: 2015-05-09 | Discharge: 2015-05-11 | Disposition: A | Discharge status: Home / Self Care | Payer: Medicare Other | Attending: Emergency Medicine | Admitting: Emergency Medicine

## 2015-05-09 ENCOUNTER — Encounter (HOSPITAL_COMMUNITY): Payer: Self-pay

## 2015-05-09 ENCOUNTER — Emergency Department (HOSPITAL_COMMUNITY): Payer: Medicare Other

## 2015-05-09 DIAGNOSIS — Z7982 Long term (current) use of aspirin: Secondary | ICD-10-CM | POA: Insufficient documentation

## 2015-05-09 DIAGNOSIS — I509 Heart failure, unspecified: Secondary | ICD-10-CM | POA: Insufficient documentation

## 2015-05-09 DIAGNOSIS — Z9861 Coronary angioplasty status: Secondary | ICD-10-CM | POA: Diagnosis not present

## 2015-05-09 DIAGNOSIS — M1990 Unspecified osteoarthritis, unspecified site: Secondary | ICD-10-CM | POA: Diagnosis not present

## 2015-05-09 DIAGNOSIS — E785 Hyperlipidemia, unspecified: Secondary | ICD-10-CM | POA: Diagnosis not present

## 2015-05-09 DIAGNOSIS — R109 Unspecified abdominal pain: Secondary | ICD-10-CM | POA: Diagnosis present

## 2015-05-09 DIAGNOSIS — Z87891 Personal history of nicotine dependence: Secondary | ICD-10-CM | POA: Diagnosis not present

## 2015-05-09 DIAGNOSIS — F419 Anxiety disorder, unspecified: Secondary | ICD-10-CM | POA: Diagnosis not present

## 2015-05-09 DIAGNOSIS — K219 Gastro-esophageal reflux disease without esophagitis: Secondary | ICD-10-CM | POA: Diagnosis not present

## 2015-05-09 DIAGNOSIS — Z79899 Other long term (current) drug therapy: Secondary | ICD-10-CM | POA: Diagnosis not present

## 2015-05-09 DIAGNOSIS — I10 Essential (primary) hypertension: Secondary | ICD-10-CM | POA: Insufficient documentation

## 2015-05-09 DIAGNOSIS — Z86010 Personal history of colonic polyps: Secondary | ICD-10-CM | POA: Diagnosis not present

## 2015-05-09 DIAGNOSIS — I2510 Atherosclerotic heart disease of native coronary artery without angina pectoris: Secondary | ICD-10-CM | POA: Insufficient documentation

## 2015-05-09 DIAGNOSIS — R1084 Generalized abdominal pain: Secondary | ICD-10-CM | POA: Insufficient documentation

## 2015-05-09 DIAGNOSIS — E039 Hypothyroidism, unspecified: Secondary | ICD-10-CM | POA: Diagnosis not present

## 2015-05-09 DIAGNOSIS — I252 Old myocardial infarction: Secondary | ICD-10-CM | POA: Insufficient documentation

## 2015-05-09 DIAGNOSIS — R51 Headache: Secondary | ICD-10-CM | POA: Insufficient documentation

## 2015-05-09 LAB — CBC
HEMATOCRIT: 29.9 % — AB (ref 36.0–46.0)
Hemoglobin: 9.3 g/dL — ABNORMAL LOW (ref 12.0–15.0)
MCH: 25.8 pg — AB (ref 26.0–34.0)
MCHC: 31.1 g/dL (ref 30.0–36.0)
MCV: 82.8 fL (ref 78.0–100.0)
PLATELETS: 270 10*3/uL (ref 150–400)
RBC: 3.61 MIL/uL — AB (ref 3.87–5.11)
RDW: 15.7 % — AB (ref 11.5–15.5)
WBC: 8.4 10*3/uL (ref 4.0–10.5)

## 2015-05-09 LAB — COMPREHENSIVE METABOLIC PANEL
ALT: 14 U/L (ref 14–54)
AST: 25 U/L (ref 15–41)
Albumin: 2.9 g/dL — ABNORMAL LOW (ref 3.5–5.0)
Alkaline Phosphatase: 41 U/L (ref 38–126)
Anion gap: 12 (ref 5–15)
BUN: 9 mg/dL (ref 6–20)
CHLORIDE: 97 mmol/L — AB (ref 101–111)
CO2: 31 mmol/L (ref 22–32)
CREATININE: 0.84 mg/dL (ref 0.44–1.00)
Calcium: 9.1 mg/dL (ref 8.9–10.3)
GFR calc Af Amer: >60 mL/min (ref >60)
GFR calc non Af Amer: >60 mL/min (ref >60)
Glucose, Bld: 112 mg/dL — ABNORMAL HIGH (ref 65–99)
POTASSIUM: 2.8 mmol/L — AB (ref 3.5–5.1)
SODIUM: 140 mmol/L (ref 135–145)
Total Bilirubin: 0.4 mg/dL (ref 0.3–1.2)
Total Protein: 5.8 g/dL — ABNORMAL LOW (ref 6.5–8.1)

## 2015-05-09 LAB — URINALYSIS, ROUTINE W REFLEX MICROSCOPIC
Bilirubin Urine: NEGATIVE
Glucose, UA: NEGATIVE mg/dL
HGB URINE DIPSTICK: NEGATIVE
Ketones, ur: NEGATIVE mg/dL
LEUKOCYTES UA: NEGATIVE
Nitrite: NEGATIVE
PROTEIN: NEGATIVE mg/dL
SPECIFIC GRAVITY, URINE: 1.023 (ref 1.005–1.030)
UROBILINOGEN UA: 1 mg/dL (ref 0.0–1.0)
pH: 6 (ref 5.0–8.0)

## 2015-05-09 LAB — CBG MONITORING, ED: Glucose-Capillary: 106 mg/dL — ABNORMAL HIGH (ref 65–99)

## 2015-05-09 LAB — I-STAT CG4 LACTIC ACID, ED: Lactic Acid, Venous: 1.52 mmol/L (ref 0.5–2.0)

## 2015-05-09 MED ORDER — ROSUVASTATIN CALCIUM 20 MG PO TABS
20.0000 mg | ORAL_TABLET | Freq: Every day | ORAL | Status: DC
  Filled 2015-05-09 (×2): qty 1

## 2015-05-09 MED ORDER — PANTOPRAZOLE SODIUM 40 MG PO TBEC
40.0000 mg | DELAYED_RELEASE_TABLET | Freq: Every day | ORAL | Status: DC
  Administered 2015-05-10 – 2015-05-11 (×2): 40 mg via ORAL
  Filled 2015-05-09 (×2): qty 1

## 2015-05-09 MED ORDER — HYDROCODONE-ACETAMINOPHEN 5-325 MG PO TABS
1.0000 | ORAL_TABLET | ORAL | Status: DC | PRN
  Administered 2015-05-10 (×2): 1 via ORAL
  Filled 2015-05-09: qty 2
  Filled 2015-05-09: qty 1

## 2015-05-09 MED ORDER — PROMETHAZINE HCL 25 MG PO TABS
25.0000 mg | ORAL_TABLET | Freq: Three times a day (TID) | ORAL | Status: DC | PRN
  Administered 2015-05-10: 25 mg via ORAL
  Filled 2015-05-09: qty 1

## 2015-05-09 MED ORDER — IOHEXOL 300 MG/ML  SOLN
80.0000 mL | Freq: Once | INTRAMUSCULAR | Status: AC | PRN
  Administered 2015-05-09: 100 mL via INTRAVENOUS

## 2015-05-09 MED ORDER — LEVOTHYROXINE SODIUM 100 MCG PO TABS: 100.0000 ug | ORAL_TABLET | Freq: Every day | ORAL | Status: DC

## 2015-05-09 MED ORDER — RIVAROXABAN 20 MG PO TABS
20.0000 mg | ORAL_TABLET | Freq: Every day | ORAL | Status: DC
  Administered 2015-05-10: 20 mg via ORAL
  Filled 2015-05-09 (×3): qty 1

## 2015-05-09 MED ORDER — ALPRAZOLAM 0.5 MG PO TABS: 1.0000 mg | ORAL_TABLET | Freq: Three times a day (TID) | ORAL | Status: DC | PRN

## 2015-05-09 MED ORDER — ASPIRIN 325 MG PO TABS
325.0000 mg | ORAL_TABLET | Freq: Every day | ORAL | Status: DC
  Administered 2015-05-10 – 2015-05-11 (×2): 325 mg via ORAL
  Filled 2015-05-09 (×2): qty 1

## 2015-05-09 MED ORDER — IOHEXOL 300 MG/ML  SOLN
25.0000 mL | INTRAMUSCULAR | Status: AC
  Administered 2015-05-09 (×2): 25 mL via ORAL

## 2015-05-09 MED ORDER — POTASSIUM CHLORIDE CRYS ER 20 MEQ PO TBCR
40.0000 meq | EXTENDED_RELEASE_TABLET | Freq: Once | ORAL | Status: AC
  Administered 2015-05-09: 40 meq via ORAL
  Filled 2015-05-09: qty 2

## 2015-05-09 MED ORDER — TRAZODONE HCL 50 MG PO TABS
50.0000 mg | ORAL_TABLET | Freq: Every day | ORAL | Status: DC
Start: 2015-05-09 — End: 2015-05-10
  Filled 2015-05-09: qty 1

## 2015-05-09 MED ORDER — LOSARTAN POTASSIUM-HCTZ 50-12.5 MG PO TABS: 1.0000 | ORAL_TABLET | Freq: Every day | ORAL | Status: DC

## 2015-05-09 NOTE — ED Provider Notes (Signed)
Patient presented to the ER with abdominal pain. Patient recently underwent appendectomy, was discovered to have cancer at time of surgery. She is pending initiation of chemotherapy. Patient was complaining of lower abdominal pain earlier, this has now improved.  Face to face Exam: HEENT - PERRLA Lungs - CTAB Heart - RRR, no M/R/G Abd - S/NT/ND Neuro - alert, oriented x3  Plan: Workup for abdominal pain performed. Family is concerned about altered mental status. Patient does appear to be slightly confused here in the ER. Family uncomfortable with managing her at home. Will require social work consult.  Orpah Greek, MD 05/09/15 1005

## 2015-05-09 NOTE — ED Notes (Signed)
Patient returned from CT. Placed back on the monitor.  

## 2015-05-09 NOTE — ED Provider Notes (Signed)
CSN: [id number]     Arrival date & time 05/09/15  0881 History   First MD Initiated Contact with Patient 05/09/15 [phone]     Chief Complaint  Patient presents with  . Altered Mental Status     (Consider location/radiation/quality/duration/timing/severity/associated sxs/prior Treatment) HPI Comments: Patient presents to the ED with a chief complaint of abdominal pain.  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  Patient states that she had been doing well until yesterday when her abdominal pain began to worsen again.  She states she became concerned this morning and wanted to come to the hospital to be evaluated.  She states that she has had some nausea, but this is relieved with zofran ODT.  She denies any associated fever, or chills.  Patient's niece states that she has had some confusion and dizziness which started this morning.  Family also states that the patient may have taken 4 Xanax.    The history is provided by the patient. No language interpreter was used.    Past Medical History  Diagnosis Date  . Arthritis     Osteoarthrosis  . CAD (coronary artery disease) 1999    CABG  . HLD (hyperlipidemia)   . HTN (hypertension)   . Hypothyroidism   . Colon polyps   . Hiatal hernia   . Peripheral neuropathy ([place])   . Anxiety   . Hemorrhoids   . Major depressive disorder ([place])   . MI (myocardial infarction) ([place])   . DVT (deep venous thrombosis) ([place])   . CHF (congestive heart failure) ([place])   . GERD (gastroesophageal reflux disease)   . Cancer [facility])     cancer of the appendix   . Confusion, postoperative    Past Surgical History  Procedure Laterality Date  . Carotid stent  07/1998    stent RCA   . Coronary stent placement  10/2001    circumflex stent  . Lumbar laminectomy      fusion 1971  . Thyroid surgery    . Cholecystectomy  2011  . Esophagogastroduodenoscopy    . Colonoscopy w/ biopsies    . Right thumb Right     Thumb  Finger Release  . Coronary artery bypass graft  1999    6 vessel per pt.  . Laparoscopic appendectomy N/A 04/11/2015    Procedure: LAPAROSCOPIC APPENDECTOMY;  Surgeon: Coralie Keens, MD;  Location: [facility];  Service: General;  Laterality: N/A;  Laparoscopic converted to open appendectomy   . Partial colectomy N/A 04/28/2015    Procedure: Ileocecectomy;  Surgeon: Coralie Keens, MD;  Location: [facility] OR;  Service: General;  Laterality: N/A;   Family History  Problem Relation Age of Onset  . Heart disease Father   . Heart disease Mother   . Heart disease Brother   . Heart disease Other     grandfather (side unknown)  . Colon cancer Neg Hx   . Cancer Sister     lumphoma   Social History  Substance Use Topics  . Smoking status: Former Smoker -- 33 years  . Smokeless tobacco: Never Used     Comment: quit smoking age 88  . Alcohol Use: No   OB History    No data available     Review of Systems  Constitutional: Negative for fever and chills.  Respiratory: Negative for shortness of breath.   Cardiovascular: Negative for chest pain.  Gastrointestinal: Positive for nausea and abdominal pain. Negative for vomiting,  diarrhea and constipation.  Genitourinary: Negative for dysuria.      Allergies  Nitrogen; Nitroglycerin; Myrbetriq; and Niaspan  Home Medications   Prior to Admission medications   Medication Sig Start Date End Date Taking? Authorizing Provider  ALPRAZolam Duanne Moron) 1 MG tablet Take 1 tablet by mouth 3 (three) times daily as needed. 04/08/15   Historical Provider, MD  aspirin 325 MG tablet Take 325 mg by mouth daily.     Historical Provider, MD  Cholecalciferol (VITAMIN D-3) 5000 UNITS TABS Take 10,000 Units by mouth.    Historical Provider, MD  esomeprazole (NEXIUM) 20 MG capsule Take 20 mg by mouth daily at 12 noon.    Historical Provider, MD  HYDROcodone-acetaminophen (NORCO/VICODIN) 5-325 MG tablet Take 1-2 tablets by mouth every 4 (four) hours as needed for  moderate pain. 05/03/15   Greer Pickerel, MD  levothyroxine (SYNTHROID, LEVOTHROID) 100 MCG tablet Take 100 mcg by mouth daily.      Historical Provider, MD  losartan-hydrochlorothiazide [facility]) 50-12.5 MG tablet TAKE 1 TABLET DAILY 04/14/15   Josue Hector, MD  ondansetron (ZOFRAN ODT) 4 MG disintegrating tablet Take 1 tablet (4 mg total) by mouth every 8 (eight) hours as needed for nausea or vomiting. 03/24/15   Gareth Morgan, MD  promethazine (PHENERGAN) 25 MG tablet Take 1 tablet (25 mg total) by mouth every 8 (eight) hours as needed for nausea or vomiting. 03/03/15   Dalia Heading, PA-C  rivaroxaban (XARELTO) 20 MG TABS tablet Take 20 mg by mouth daily.    Historical Provider, MD  rosuvastatin (CRESTOR) 20 MG tablet Take 20 mg by mouth daily.      Historical Provider, MD   BP 100/86 mmHg  Pulse 82  Temp(Src) 98.9 F (37.2 C) (Oral)  Resp 16  Ht 5\' 5"  (1.651 m)  Wt 220 lb (99.791 kg)  BMI 36.61 kg/m2  SpO2 98% Physical Exam  Constitutional: She is oriented to person, place, and time. She appears well-developed and well-nourished.  HENT:  Head: Normocephalic and atraumatic.  Eyes: Conjunctivae and EOM are normal. Pupils are equal, round, and reactive to light.  Neck: Normal range of motion. Neck supple.  Cardiovascular: Normal rate and regular rhythm.  Exam reveals no gallop and no friction rub.   No murmur heard. Pulmonary/Chest: Effort normal and breath sounds normal. No respiratory distress. She has no wheezes. She has no rales. She exhibits no tenderness.  Abdominal: Soft. Bowel sounds are normal. She exhibits no distension and no mass. There is tenderness. There is no rebound and no guarding.  Mild to moderate abdominal pain surrounding the incision site  Musculoskeletal: Normal range of motion. She exhibits no edema or tenderness.  Neurological: She is alert and oriented to person, place, and time.  Skin: Skin is warm and dry.  Psychiatric: She has a normal mood and affect.  Her behavior is normal. Judgment and thought content normal.  Nursing note and vitals reviewed.   ED Course  Procedures (including critical care time) Results for orders placed or performed during the hospital encounter of 05/09/15  Comprehensive metabolic panel  Result Value Ref Range   Sodium 140 135 - 145 mmol/L   Potassium 2.8 (L) 3.5 - 5.1 mmol/L   Chloride 97 (L) 101 - 111 mmol/L   CO2 31 22 - 32 mmol/L   Glucose, Bld 112 (H) 65 - 99 mg/dL   BUN 9 6 - 20 mg/dL   Creatinine, Ser 0.84 0.44 - 1.00 mg/dL   Calcium 9.1 8.9 -  10.3 mg/dL   Total Protein 5.8 (L) 6.5 - 8.1 g/dL   Albumin 2.9 (L) 3.5 - 5.0 g/dL   AST 25 15 - 41 U/L   ALT 14 14 - 54 U/L   Alkaline Phosphatase 41 38 - 126 U/L   Total Bilirubin 0.4 0.3 - 1.2 mg/dL   GFR calc non Af Amer >60 >60 mL/min   GFR calc Af Amer >60 >60 mL/min   Anion gap 12 5 - 15  CBC  Result Value Ref Range   WBC 8.4 4.0 - 10.5 K/uL   RBC 3.61 (L) 3.87 - 5.11 MIL/uL   Hemoglobin 9.3 (L) 12.0 - 15.0 g/dL   HCT 29.9 (L) 36.0 - 46.0 %   MCV 82.8 78.0 - 100.0 fL   MCH 25.8 (L) 26.0 - 34.0 pg   MCHC 31.1 30.0 - 36.0 g/dL   RDW 15.7 (H) 11.5 - 15.5 %   Platelets 270 150 - 400 K/uL  Urinalysis, Routine w reflex microscopic (not at [facility])  Result Value Ref Range   Color, Urine AMBER (A) YELLOW   APPearance CLEAR CLEAR   Specific Gravity, Urine 1.023 1.005 - 1.030   pH 6.0 5.0 - 8.0   Glucose, UA NEGATIVE NEGATIVE mg/dL   Hgb urine dipstick NEGATIVE NEGATIVE   Bilirubin Urine NEGATIVE NEGATIVE   Ketones, ur NEGATIVE NEGATIVE mg/dL   Protein, ur NEGATIVE NEGATIVE mg/dL   Urobilinogen, UA 1.0 0.0 - 1.0 mg/dL   Nitrite NEGATIVE NEGATIVE   Leukocytes, UA NEGATIVE NEGATIVE  CBG monitoring, ED  Result Value Ref Range   Glucose-Capillary 106 (H) 65 - 99 mg/dL  I-Stat CG4 Lactic Acid, ED  Result Value Ref Range   Lactic Acid, Venous 1.52 0.5 - 2.0 mmol/L   Ct Abdomen Pelvis W Contrast  05/09/2015  CLINICAL DATA:  79 year old female with  acute abdominal and pelvic pain. 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 EXAM: CT ABDOMEN AND PELVIS WITH CONTRAST TECHNIQUE: Multidetector CT imaging of the abdomen and pelvis was performed using the standard protocol following bolus administration of intravenous contrast. CONTRAST:  167mL OMNIPAQUE IOHEXOL 300 MG/ML  SOLN COMPARISON:  03/03/2015 CT. FINDINGS: Lower chest: Mild cardiomegaly and cardiac surgical changes noted. Minimal bibasilar atelectasis identified. Hepatobiliary: The liver is unremarkable. The patient is status post cholecystectomy. There is no evidence of biliary dilatation. Pancreas: Unremarkable Spleen: Unremarkable Adrenals/Urinary Tract: Bilateral renal cortical thinning noted. The kidneys are otherwise unremarkable. The adrenal glands and bladder are unremarkable. Stomach/Bowel: Right ileocolectomy changes identified. There is no evidence of focal abscess, pneumoperitoneum, free fluid or bowel obstruction. There is no evidence of focal bowel wall thickening. Vascular/Lymphatic: No enlarged lymph nodes noted. Aortic atherosclerotic calcifications present without aneurysm. The visualized mesenteric vasculature are patent. Reproductive: The uterus and adnexal regions are within normal limits. Other: Postoperative changes ill-defined fluid in the anterior abdominal/ pelvic fat noted. Musculoskeletal: No acute abnormalities. Moderate severe degenerative changes in the lumbar spine again identified. IMPRESSION: Right ileocolectomy changes without complicating features within the abdomen/ pelvis. No evidence of free fluid, abscess, pneumoperitoneum or bowel obstruction. Postoperative changes and ill-defined fluid in the anterior abdominal/pelvic fat. This likely is normal post surgical changes but correlate with signs of infection. Mild cardiomegaly and abdominal aortic atherosclerosis. Electronically Signed   By: Margarette Canada M.D.   On: 05/09/2015 10:51      MDM   Final  diagnoses:  Generalized abdominal pain    Patient with recent open abdominal surgery and worsening abdominal pain.  Will check labs and CT to rule out post-op complication.  The patient looks well.  Family calls concerned that the patient may have taken 4 Xanax.  She is alert and oriented on my exam.  She is quite well appearing.  At this point based on exam and vitals I do not believe the patient to have overdosed on benzos, will continue to monitor.  Family calls back and requests that the patient be admitted to behavioral health because she has sundowner's dementia.  Patient discussed with Dr. Betsey Holiday.  Plan to consult social work pending normal CT.    K replaced in ED.  Prior notes reviewed.  Patient has had intermittent episodes of confusion before and after her surgery.  Social work has seen patient and spoken with family.  Family is on the way to pick her up.  RN spoke with multiple family members regarding ride home.  Daughter will come at 61 to take patient home.  Patient does not qualify for SNF, but does have advance home care set up.   Montine Circle, PA-C 05/09/15 [place], PA-C 05/09/15 [place], MD 05/09/15 [phone]

## 2015-05-09 NOTE — ED Notes (Signed)
Physical therapist has left the room to speak with patient's nurse, Jarrett Soho, RN

## 2015-05-09 NOTE — ED Notes (Signed)
Patient has returned from being out of the department; patient placed back on monitor, continuous pulse oximetry, blood pressure cuff and oxygen [place] (2L); warm blanket given; patient resting at this time

## 2015-05-09 NOTE — ED Notes (Addendum)
Stacy joy, granddaughter-in-law is coming to pick patient up at 1700 after work. [phone]

## 2015-05-09 NOTE — ED Notes (Signed)
Opal Sidles, EMT at the bedside for safety sitter

## 2015-05-09 NOTE — ED Notes (Signed)
Pt family member called and states she was recently diagnosed with sun downers and dementia and family sent pt here to have her committed to [facility].

## 2015-05-09 NOTE — ED Notes (Signed)
Spoke with Granddaughter-in-law on the phone. Stated, "Please do not let Cyndie Mull take patient home. She is not the healthcare power of attorney. Patient was supposed to go to a SNF after Rehab and daughter stated she would take her home. The rest of the family thinks patient should be in a facility. We are in a legal battle trying to get control of patient's care." PA and SW Jarrett Soho made aware.

## 2015-05-09 NOTE — ED Notes (Signed)
Relieving safety sitter for lunch break; patient appears to be asleep

## 2015-05-09 NOTE — Evaluation (Signed)
Physical Therapy Evaluation and D/C Patient Details Name: Rachel Conner MRN: [id number] DOB: [birth year] Today's Date: 05/09/2015   History of Present Illness  79 yo patient presents to the ED with a chief complaint of abdominal pain. 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  Clinical Impression  Pt admitted with above diagnosis. Pt currently without significant functional limitations as pt is ambulating with guard assist and appears close to baseline with mobility however needs 24 hour care because she is confused.  Granddaughter is taking pt home with 24 hour assist per [place], SW.  Will benefit from [place], and PCA as before.  Pt is d/cing today per Jarrett Soho as granddaughter is picking pt up at 5:00 pm.  [facility] can address further issues.  Will sign off.    Follow Up Recommendations Home health PT;Supervision/Assistance - 24 hour (HHOT, PCA sitter)    Equipment Recommendations  None recommended by PT    Recommendations for Other Services       Precautions / Restrictions Precautions Precautions: Fall Precaution Comments: mildly unsteady on her feet without AD, she at baseline, doens't use an assistive device.  Restrictions Weight Bearing Restrictions: No      Mobility  Bed Mobility Overal bed mobility: Needs Assistance Bed Mobility: Rolling;Sidelying to Sit Rolling: Supervision Sidelying to sit: Supervision       General bed mobility comments: Pt can get OOB when she feels like it.  Transfers Overall transfer level: Needs assistance   Transfers: Sit to/from Stand Sit to Stand: Min guard         General transfer comment: Pt refused to get up for this PT however nurse stated that pt needed min guard asssit as she was staggering when ambulating earlier today.   Ambulation/Gait Ambulation/Gait assistance: Min guard Ambulation Distance (Feet): 100 Feet Assistive device: 1 person hand held assist        General Gait Details: Per nurse, pt was able to ambulate in hallway earlier today and she was unsteady and confused.  Stairs            Wheelchair Mobility    Modified Rankin (Stroke Patients Only)       Balance           Standing balance support: Single extremity supported;During functional activity Standing balance-Leahy Scale: Fair Standing balance comment: Upper extremity support for pt for balance.                              Pertinent Vitals/Pain Pain Assessment: No/denies pain  VSS    Home Living Family/patient expects to be discharged to:: Private residence Living Arrangements: Alone Available Help at Discharge: Family;Available 24 hours/day Type of Home: House Home Access: Level entry     Home Layout: One level Home Equipment: Walker - 2 wheels;Cane - single point;Shower seat;Wheelchair - manual      Prior Function Level of Independence: Independent         Comments: per chart pt was having some confusion prior to and after recent surgery     Hand Dominance   Dominant Hand: Right    Extremity/Trunk Assessment   Upper Extremity Assessment: Defer to OT evaluation           Lower Extremity Assessment: Generalized weakness         Communication   Communication: No difficulties  Cognition Arousal/Alertness: Awake/alert Behavior During Therapy: [facility] for  tasks assessed/performed Overall Cognitive Status: No family/caregiver present to determine baseline cognitive functioning Area of Impairment: Memory;Problem solving;Following commands;Safety/judgement     Memory: Decreased short-term memory Following Commands: Follows one step commands consistently Safety/Judgement: Decreased awareness of safety;Decreased awareness of deficits   Problem Solving: Slow processing;Requires verbal cues;Decreased initiation General Comments: RN and sitter state that pt gets up and walks around when she wants to.  Intermittently  confused.  Pt told this PT she lives with her husband.  husband recently deceased.      General Comments      Exercises        Assessment/Plan    PT Assessment All further PT needs can be met in the next venue of care  PT Diagnosis Generalized weakness   PT Problem List Decreased activity tolerance;Decreased balance;Decreased mobility;Decreased knowledge of use of DME;Decreased safety awareness;Decreased knowledge of precautions  PT Treatment Interventions Functional mobility training;Therapeutic activities;Therapeutic exercise   PT Goals (Current goals can be found in the Care Plan section) Acute Rehab PT Goals Patient Stated Goal: unable to state PT Goal Formulation: All assessment and education complete, DC therapy    Frequency     Barriers to discharge        Co-evaluation               End of Session   Activity Tolerance: Patient limited by fatigue Patient left: in bed;with call bell/phone within reach;with nursing/sitter in room Nurse Communication: Mobility status    Functional Assessment Tool Used: clinical judgment Functional Limitation: Mobility: Walking and moving around Mobility: Walking and Moving Around Current Status (U5992): At least 1 percent but less than 20 percent impaired, limited or restricted Mobility: Walking and Moving Around Goal Status [phone]): At least 1 percent but less than 20 percent impaired, limited or restricted Mobility: Walking and Moving Around Discharge Status [phone]): At least 1 percent but less than 20 percent impaired, limited or restricted    Time: 1415-1427 PT Time Calculation (min) (ACUTE ONLY): 12 min   Charges:   PT Evaluation $Initial PT Evaluation Tier I: 1 Procedure     PT G Codes:   PT G-Codes **NOT FOR INPATIENT CLASS** Functional Assessment Tool Used: clinical judgment Functional Limitation: Mobility: Walking and moving around Mobility: Walking and Moving Around Current Status (D8006): At least 1 percent  but less than 20 percent impaired, limited or restricted Mobility: Walking and Moving Around Goal Status [phone]): At least 1 percent but less than 20 percent impaired, limited or restricted Mobility: Walking and Moving Around Discharge Status [phone]): At least 1 percent but less than 20 percent impaired, limited or restricted    Denice Paradise 05/09/2015, 4:41 PM Kinsler Soeder,PT Acute Rehabilitation [phone] (pager)

## 2015-05-09 NOTE — ED Notes (Signed)
Pt family member called and stated that she may have taken 4 xanax.

## 2015-05-09 NOTE — ED Notes (Signed)
Placed patient on bedpan to attempt to provide an urine specimen

## 2015-05-09 NOTE — ED Notes (Signed)
Pt comes from home via [employer], pt's niece called for new onset of confusion and some c/o dizziness starting this morning. Pt has had recent abd surgery about 2 weeks ago.

## 2015-05-09 NOTE — ED Notes (Signed)
Myself and Opal Sidles, EMT placed patient on a bedpan

## 2015-05-09 NOTE — ED Notes (Signed)
Physical therapist in with patient at this time.

## 2015-05-09 NOTE — Progress Notes (Signed)
LCSW spoke with POA (shared between [place] and brother) Jeani Hawking reports patient was to go to SNF at time of DC for "around the clock care" after surgery to get better and recover. At time of DC brother and Jeani Hawking could not be here, thus they sent other sister Lelon Frohlich.  Jeani Hawking reports sister is not a good fit for patient as she has been alleging misuse of money and medications. Jeani Hawking denies report to police or pressing charges as this sister remains involved and cares for patient as brother and Jeani Hawking live too far away.  LCSW discussed options of payment for SNF. Patient at this time has no real skilled need and on Nov 1 patient will begin chemotherapy. Plan at time of dc on 10/22 was for only a week or two at SNF to recover.  Patient was independent at time of DC for SNF on 10/22 and remains able to walk and get around. From description of behaviors and most recent episodes it appears patient has underlying MH and possibly dementia. Sister reports she is active with [place] neurology and working to get her an appointment. LCSW gave referrals for geriatric psychiatrist as well.  Sister is agreeable for patient to go back home and family will be working to hire professional agency to provide care as she continues to recover from surgery.  Will update team and MD Patient's daughter will be coming to pick patient up. Patient at this time does not meet criteria for IVC or inpatient admission for psych. Outpatient options given to family regarding psychiatric assessment/medication of "episodes/behaviors".  Lane Hacker, MSW Clinical Social Work: Emergency Room [phone]

## 2015-05-09 NOTE — Progress Notes (Signed)
CSW spoke with Rachel Conner (at bedside) who identifies pt as her MIL's sister and her HCPOA Rachel Conner (daughter) via phone ([phone]).  Rachel Conner  Pt's son) is also listed as HCPOA.  Per Rachel Conner, [place] documents were provided to hospital during pt's last admission, 10/16-10-21.  During that admission, the d/c plan was for pt to go to SNF at d/c, however another one of pt's children, Rachel Conner, told CSW that the plan was being changed to home with [facility] with Rachel Conner providing 24 hour care.  Rachel Conner did not have any legal authority to make this decision and hasn't not provided care for pt as promised.  Pt's POAs are asking for SNF placement from the ED as pt cannot live alone and does not have 24 hour care at home.  PT recommendation was for SNF on 10/21. PT eval to be repeated in am.  Eval from 10/28 reflects home with HHC/24 hour supervision, but pt does not have this care at home.  POA agreeable to bed search in [place], [employer] and [employer].  CSW will continue to follow for placement.

## 2015-05-09 NOTE — ED Notes (Signed)
Social Work made aware of the patient's situation.

## 2015-05-09 NOTE — ED Notes (Signed)
CBG 106 

## 2015-05-09 NOTE — Care Management Note (Addendum)
Case Management Note  Patient Details  Name: Rachel Conner MRN: [id number] Date of Birth: 1935/11/10  Subjective/Objective:                  79 yo patient presents to the ED with a chief complaint of abdominal pain. 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  Action/Plan: Will check labs and CT to rule out post-op complication. Follow for disposition needs.   Expected Discharge Date:       05/09/15           Expected Discharge Plan:  Skilled Nursing Facility  In-House Referral:  Clinical Social Work  Discharge planning Services  CM Consult  Post Acute Care Choice:    Choice offered to:     DME Arranged:    DME Agency:     HH Arranged:    [place] Agency:     Status of Service:  In process, will continue to follow  Medicare Important Message Given:    Date Medicare IM Given:    Medicare IM give by:    Date Additional Medicare IM Given:    Additional Medicare Important Message give by:     If discussed at [place] of Stay Meetings, dates discussed:    Additional Comments: Pt currently active with [facility] [place] services as confirmed by NCM with Santiago Glad, RN of [facility].  Santiago Glad states that [facility] nursing was deferred by family because they wanted to wait until after pt 10/26 follow-up appt with MD.  HHPT  Has completed one visit and also deferred by family until after MD visit. Wei Poplaski J. Clydene Laming, [place], [place], [employer] [phone].  Fuller Mandril, RN 05/09/2015, 10:44 AM

## 2015-05-09 NOTE — ED Notes (Signed)
Patient was unable to urinate in the bedpan; assisted Jarrett Soho, RN as she performed an In and Out cath on patient

## 2015-05-09 NOTE — ED Notes (Signed)
Spoke with sister over the phone, Rachel Conner. Stated that she spoke with patient yesterday and she was not confused at all. When she spoke with her sister today on the phone, patient was actively confused. Sister states that this is not like her and it happened all the sudden.

## 2015-05-09 NOTE — ED Notes (Signed)
Patient taken off of bedpan; patient cleaned up and now resting on stretcher; safety sitter at bedside

## 2015-05-09 NOTE — Discharge Instructions (Signed)

## 2015-05-09 NOTE — ED Notes (Signed)
Patient being transported to CT at this time 

## 2015-05-09 NOTE — ED Notes (Signed)
Safety sitter has returned

## 2015-05-10 DIAGNOSIS — R1084 Generalized abdominal pain: Secondary | ICD-10-CM | POA: Diagnosis not present

## 2015-05-10 MED ORDER — LOSARTAN POTASSIUM 50 MG PO TABS
50.0000 mg | ORAL_TABLET | Freq: Every day | ORAL | Status: DC
  Administered 2015-05-10 – 2015-05-11 (×2): 50 mg via ORAL
  Filled 2015-05-10 (×3): qty 1

## 2015-05-10 MED ORDER — LEVOTHYROXINE SODIUM 100 MCG PO TABS
100.0000 ug | ORAL_TABLET | Freq: Every day | ORAL | Status: DC
  Administered 2015-05-10 – 2015-05-11 (×2): 100 ug via ORAL
  Filled 2015-05-10 (×3): qty 1

## 2015-05-10 MED ORDER — ROSUVASTATIN CALCIUM 20 MG PO TABS
20.0000 mg | ORAL_TABLET | Freq: Every day | ORAL | Status: DC
  Administered 2015-05-10: 20 mg via ORAL
  Filled 2015-05-10 (×2): qty 1

## 2015-05-10 MED ORDER — DOXYCYCLINE HYCLATE 100 MG PO TABS
100.0000 mg | ORAL_TABLET | Freq: Two times a day (BID) | ORAL | Status: DC
  Administered 2015-05-10 – 2015-05-11 (×3): 100 mg via ORAL
  Filled 2015-05-10 (×3): qty 1

## 2015-05-10 MED ORDER — HYDROCHLOROTHIAZIDE 12.5 MG PO CAPS
12.5000 mg | ORAL_CAPSULE | Freq: Every day | ORAL | Status: DC
Start: 2015-05-10 — End: 2015-05-11
  Administered 2015-05-10 – 2015-05-11 (×2): 12.5 mg via ORAL
  Filled 2015-05-10 (×2): qty 1

## 2015-05-10 MED ORDER — TRAZODONE HCL 50 MG PO TABS
50.0000 mg | ORAL_TABLET | Freq: Every day | ORAL | Status: DC
  Administered 2015-05-10: 50 mg via ORAL
  Filled 2015-05-10: qty 1

## 2015-05-10 NOTE — Progress Notes (Signed)
PT Note This PT completed assessment on 05/09/15 for this pt.  Pt confused and not willing to do much with this PT however nursing had ambulated pt and stated that she is unsafe and unsteady with gait.  Pt demonstrated impulsivity with nurse and when rolling in bed while this PT was in room with poor overall safety awareness.  Discussed this pt with SW on 05/09/15 who stated that pt had 24 hour care at home by a granddaughter therefore this PT made recommendation to go home with HHPT as long as pt had 24 hour care.  Upon arrival today, 05/10/15, it appears that family came in later on 05/09/15 and stated that pt does not have 24 hour care, therefore recommendation would change to SNF as pt is unsafe to go home without 24 hour care due to poor safety awareness, confusion and poor balance.  Thanks and please contact PT if any further needs arise.   [place] [phone] (pager)

## 2015-05-10 NOTE — Social Work (Signed)
CSW met/spoke with family in order to provide appropriate dc plan for patient. CSW faxed patient to both [place] and [employer] [facility]. Family preference is for [facility] in [place]. CSW spoke with Jeani Hawking [facility]) who is in agreement with this plan. CSW also spoke with patient's grandson Jeanell Sparrow and his wife Marzetta Board. Jeani Hawking supports sharing information with [facility].  CSW following.  Christene Lye MSW, [place]

## 2015-05-10 NOTE — ED Provider Notes (Signed)
Asked to see patient by nursing staff. She was experiencing some redness at her surgical site. I did see her the other day as well. Yesterday she had redness at the staple sites which is still present today but there is some erythema at the inferior portion of the surgery site not associated with staples. Cannot rule out early cellulitis. There is no drainage. There is no induration. There is no fluctuance. Will initiate on doxycycline. I did briefly consult Dr. Georgette Dover to determine if the staples could be removed 12 days after surgery, and he did feel that this was reasonable. Patient is appropriate for follow-up in the office as scheduled.  Orpah Greek, MD 05/10/15 [phone]

## 2015-05-10 NOTE — ED Notes (Signed)
Per Jeani Hawking, pt's daughter and POA, pt is to be confidential. Vicente Serene, Registration, to perform. Pass code is [phone].

## 2015-05-10 NOTE — ED Notes (Signed)
Hydrocodone given as requested for pain.

## 2015-05-10 NOTE — ED Notes (Signed)
LM for SW - [phone].

## 2015-05-10 NOTE — ED Notes (Addendum)
PLEASE CALL PT'S Rachel Conner, - [phone] OR HIS Rachel Conner, [place] [phone] - FOR ANY NEEDS FOR PT. [name] (PT'S DAUGHTER) IS POA AND IS UNABLE TO COME TO ED AND HAS APPROVED FOR RAY OR STACY TO BE NOTIFIED. Grandson aware may change pt's room so can be viewed easier from nurses' desk. Pt's 1 belongings bag - w/clothing and her purse placed on counter in room and pt's glasses placed on bedside table as she requested.

## 2015-05-10 NOTE — Progress Notes (Signed)
   05/10/15 0800  PT - Assessment/Plan  PT Plan Discharge plan needs to be updated  Follow Up Recommendations SNF;Supervision/Assistance - 24 hour  PT equipment None recommended by PT  [place] [phone] (pager)

## 2015-05-10 NOTE — ED Notes (Signed)
Spoke w/Jeannie, CM.

## 2015-05-10 NOTE — Social Work (Signed)
Patient has bed at [facility] for admission on 05/11/15. CSW has shared with family and will facilitate d'c.  Christene Lye MSW, [place]

## 2015-05-10 NOTE — ED Notes (Addendum)
Dr Betsey Holiday in w/pt - assessing pt's incision site and staples. Advised and family will consult w/surgeon re: staples.

## 2015-05-11 DIAGNOSIS — R1084 Generalized abdominal pain: Secondary | ICD-10-CM | POA: Diagnosis not present

## 2015-05-11 MED ORDER — DOXYCYCLINE HYCLATE 100 MG PO TABS: 100.0000 mg | ORAL_TABLET | Freq: Two times a day (BID) | ORAL | Status: DC

## 2015-05-11 NOTE — ED Notes (Signed)
Freeman Caldron, SW, Dr Betsey Holiday started pt on Doxy yesterday for surgical site incision infection.

## 2015-05-11 NOTE — ED Notes (Addendum)
Shower supplies given to pt - set up in room so she may wash. Pt requesting to shower then states she may be too weak to stand that long for it. States she will wait for her family. Pt placed her glasses on bedside table.

## 2015-05-11 NOTE — Progress Notes (Signed)
CSW coordinated pt d/c to [facility].  [facility] ambulance arranged.  Son/HCPOA, Cristopher [place], notified.

## 2015-05-11 NOTE — ED Notes (Signed)
Pt voices understanding and is in agreement w/going to [facility].

## 2015-05-21 ENCOUNTER — Encounter: Payer: Self-pay | Admitting: *Deleted

## 2015-05-25 NOTE — Progress Notes (Signed)
Patient ID: Rachel Conner, female   DOB: 1936-07-06, 79 y.o.   MRN: [id number] Rachel Conner is seen today in followup for history of coronary artery disease and bypass surgery. I believe her bypass was in 99. He subsequently had a stenting of the native RCA she has an occluded vein graft to the right. She also has a stent in the native obtuse marginal branch. Her last catheter was in 10/27/2001.  Myoview in 10/28/2006 was nonischemic.her ejection fraction 64% at that time. She is not having t any significant chest pain. She has had a persistant dry cough with lisinipril Changed to cozaar in February She continues to need to work on her diet and carb intake. He BS has been ok as has her cholesterol as checked by her primary She had succesful gallbladder surgery in March. She had her F/U colonoscopy and there were no issues. In February has extensive LLE DVT F/U duplex in August showed mild residual thrombus in left external iliac. Compliant with coumadin Being checked in [place] and has been too low Repeat duplex here today still shows large clot burden in left femoral and iliac with limited phasicity. Anticoagulation changed to xarelto 2013/10/27   Having more memory problems Seen at [facility] recently ? Vascular dementia HR low and lopressor stopped Husband died in 10/28/2022 BP improved adding ACE and calcium blocker   Hurt right knee meniscal tear and may need surgery  Some SSCP pre op myovue done 09/11/14  Reviewed No ischemia or infarct EF 66%   01/15/15  Carotids reviewed plaque no stenosis ordered by eye doctor  Still very depressed   Family issues regarding her guardianship but she clearly needs assisted living   ROS: Denies fever, malais, weight loss, blurry vision, decreased visual acuity, cough, sputum, SOB, hemoptysis, pleuritic pain, palpitaitons, heartburn, abdominal pain, melena, lower extremity edema, claudication, or rash.  All other systems reviewed and negative  General: Affect appropriate Healthy:  appears  stated age 89: normal Neck supple with no adenopathy JVP normal no bruits no thyromegaly Lungs clear with no wheezing and good diaphragmatic motion Heart:  S1/S2 no murmur, no rub, gallop or click PMI normal Abdomen: benighn, BS positve, no tenderness, no AAA no bruit.  No HSM or HJR Distal pulses intact with no bruits No edema Neuro non-focal Skin warm and dry No muscular weakness   Current Outpatient Prescriptions  Medication Sig Dispense Refill  . ALPRAZolam (XANAX) 1 MG tablet Take 1 tablet by mouth 3 (three) times daily as needed for anxiety.     Marland Kitchen aspirin 325 MG tablet Take 325 mg by mouth daily.     . Cholecalciferol (VITAMIN D-3) 5000 UNITS TABS Take 10,000 Units by mouth.    . doxycycline (VIBRA-TABS) 100 MG tablet Take 1 tablet (100 mg total) by mouth 2 (two) times daily. 20 tablet 0  . esomeprazole (NEXIUM) 20 MG capsule Take 20 mg by mouth daily at 12 noon.    Marland Kitchen HYDROcodone-acetaminophen (NORCO/VICODIN) 5-325 MG tablet Take 1-2 tablets by mouth every 4 (four) hours as needed for moderate pain. 30 tablet 0  . levothyroxine (SYNTHROID, LEVOTHROID) 100 MCG tablet Take 100 mcg by mouth daily.      Marland Kitchen losartan-hydrochlorothiazide (HYZAAR) 50-12.5 MG tablet Take 1 tablet by mouth daily.    . promethazine (PHENERGAN) 25 MG tablet Take 1 tablet (25 mg total) by mouth every 8 (eight) hours as needed for nausea or vomiting. 15 tablet 0  . rivaroxaban (XARELTO) 20 MG TABS tablet Take 20  mg by mouth daily.    . rosuvastatin (CRESTOR) 20 MG tablet Take 20 mg by mouth daily.      . traZODone (DESYREL) 50 MG tablet Take 50 mg by mouth daily.     No current facility-administered medications for this visit.    Allergies  Nitrogen; Nitroglycerin; Myrbetriq; and Niaspan  Electrocardiogram:  SR rate 55 nonspecific ST changes  02/12/14    09/11/14  SR rate 82  Nonspecific ST changes   Assessment and Plan CAD:  Stable with no angina and good activity level.  Continue medical Rx HTN:  Well controlled.  Continue current medications and low sodium Dash type diet.   Chol:  Labs from [place] reviewed and LDL 78 normal LFTls Cholesterol is at goal.  Continue current dose of statin and diet Rx.  No myalgias or side effects.  F/U  LFT's in 6 months. Lab Results  Component Value Date   LDLCALC 81 11/07/2013  Depression: major issue not taking Celexa  F/u primary consdier Pristiq  F/u with me in 6 months

## 2015-05-26 ENCOUNTER — Telehealth: Payer: Self-pay | Admitting: Genetic Counselor

## 2015-05-26 ENCOUNTER — Ambulatory Visit (INDEPENDENT_AMBULATORY_CARE_PROVIDER_SITE_OTHER): Payer: Medicare Other | Admitting: Cardiovascular Disease

## 2015-05-26 ENCOUNTER — Encounter: Payer: Self-pay | Admitting: Cardiovascular Disease

## 2015-05-26 VITALS — BP 96/50 | HR 42 | Ht 65.0 in

## 2015-05-26 DIAGNOSIS — I119 Hypertensive heart disease without heart failure: Secondary | ICD-10-CM

## 2015-05-26 DIAGNOSIS — I2510 Atherosclerotic heart disease of native coronary artery without angina pectoris: Secondary | ICD-10-CM

## 2015-05-26 NOTE — Telephone Encounter (Signed)
LT MESS REGARDING GENETIC COUNSELING REFERRAL °

## 2015-05-26 NOTE — Patient Instructions (Signed)

## 2015-05-29 ENCOUNTER — Ambulatory Visit (HOSPITAL_BASED_OUTPATIENT_CLINIC_OR_DEPARTMENT_OTHER): Payer: Medicare Other | Admitting: Hematology

## 2015-05-29 ENCOUNTER — Encounter: Payer: Self-pay | Admitting: Hematology

## 2015-05-29 ENCOUNTER — Telehealth: Payer: Self-pay | Admitting: Hematology

## 2015-05-29 ENCOUNTER — Encounter: Payer: Self-pay | Admitting: *Deleted

## 2015-05-29 VITALS — BP 139/69 | HR 88 | Temp 98.5°F | Resp 18 | Ht 65.0 in | Wt 189.8 lb

## 2015-05-29 DIAGNOSIS — C189 Malignant neoplasm of colon, unspecified: Secondary | ICD-10-CM

## 2015-05-29 DIAGNOSIS — D649 Anemia, unspecified: Secondary | ICD-10-CM

## 2015-05-29 DIAGNOSIS — C181 Malignant neoplasm of appendix: Secondary | ICD-10-CM | POA: Diagnosis present

## 2015-05-29 NOTE — Telephone Encounter (Signed)
Gave patient son avs report and appointments for December. Central to contact patient re ct - son aware. Son number listed as primary. Message to YF no ct order.

## 2015-05-29 NOTE — Progress Notes (Signed)
[place]  [phone] Fax:([phone]) [phone]  Clinic New Consult Note   Patient Care Team: Pcp Not In System as PCP - General 05/29/2015  Referring physician: Dr. Ninfa Linden  CHIEF COMPLAINTS/PURPOSE OF CONSULTATION:  Mixed carcinoids-adenocarcinoma of appendix  HISTORY OF PRESENTING ILLNESS:  Rachel Conner 79 y.o. 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 She is accompanied by her son to the clinic today. She came in a wheelchair.  She presented with abdominal pain, intermittent nausea and vomiting, diarrhea, for a few months. She was seen at [facility] emergency room in September, and CT of abdomen and pelvis revealed dilated appendix. She was referred to general surgeon Dr. Belenda Cruise, who brought her to OR on 04/11/2015 for appendectomy. She tolerated surgery well. The surgical path reviewed mixed carcinoid and adenocarcinoma, with area of small perforation. She was brought back to the OR on 04/28/2015, and underwent right hemicolectomy.  She has been recovering well from her surgery. She still feels quite fatigued, she is able to do most ADLs, but not much other activities. She has physical therapist and occupational therapist coming to her house twice a week. Her appetite has improved, she is eating better. Her pain incision has been much improved.   MEDICAL HISTORY:  Past Medical History  Diagnosis Date  . Arthritis     Osteoarthrosis  . CAD (coronary artery disease) 1999    CABG  . HLD (hyperlipidemia)   . HTN (hypertension)   . Hypothyroidism   . Colon polyps   . Hiatal hernia   . Peripheral neuropathy ([place])   . Anxiety   . Hemorrhoids   . Major depressive disorder ([place])   . MI (myocardial infarction) ([place])   . DVT (deep venous thrombosis) ([place])   . CHF (congestive heart failure) ([place])   . GERD (gastroesophageal reflux disease)   . Cancer [facility])     cancer of the appendix   . Confusion,  postoperative     SURGICAL HISTORY: Past Surgical History  Procedure Laterality Date  . Carotid stent  07/1998    stent RCA   . Coronary stent placement  10/2001    circumflex stent  . Lumbar laminectomy      fusion 1971  . Thyroid surgery    . Cholecystectomy  2011  . Esophagogastroduodenoscopy    . Colonoscopy w/ biopsies    . Right thumb Right     Thumb Finger Release  . Coronary artery bypass graft  1999    6 vessel per pt.  . Laparoscopic appendectomy N/A 04/11/2015    Procedure: LAPAROSCOPIC APPENDECTOMY;  Surgeon: Coralie Keens, MD;  Location: [place];  Service: General;  Laterality: N/A;  Laparoscopic converted to open appendectomy   . Partial colectomy N/A 04/28/2015    Procedure: Ileocecectomy;  Surgeon: Coralie Keens, MD;  Location: [place];  Service: General;  Laterality: N/A;    SOCIAL HISTORY: Social History   Social History  . Marital Status: Married    Spouse Name: N/A  . Number of Children: 3  . Years of Education: N/A   Occupational History  . retired    Social History Main Topics  . Smoking status: Former Smoker -- 50 years  . Smokeless tobacco: Never Used     Comment: quit smoking age 38  . Alcohol Use: No  . Drug Use: No  . Sexual Activity: Not on file   Other Topics Concern  . Not on file   Social History  Narrative    FAMILY HISTORY: Family History  Problem Relation Age of Onset  . Heart disease Father   . Heart disease Mother   . Heart disease Brother   . Heart disease Other     grandfather (side unknown)  . Colon cancer Neg Hx   . Cancer Sister     lumphoma    ALLERGIES:  is allergic to nitrogen; nitroglycerin; myrbetriq; and niaspan.  MEDICATIONS:  Current Outpatient Prescriptions  Medication Sig Dispense Refill  . ALPRAZolam (XANAX) 1 MG tablet Take 1 tablet by mouth 3 (three) times daily as needed for anxiety.     Marland Kitchen aspirin 325 MG tablet Take 325 mg by mouth daily.     . Cholecalciferol (VITAMIN D-3) 5000 UNITS TABS  Take 10,000 Units by mouth.    . doxycycline (VIBRA-TABS) 100 MG tablet Take 1 tablet (100 mg total) by mouth 2 (two) times daily. 20 tablet 0  . esomeprazole (NEXIUM) 20 MG capsule Take 20 mg by mouth daily at 12 noon.    Marland Kitchen HYDROcodone-acetaminophen (NORCO/VICODIN) 5-325 MG tablet Take 1-2 tablets by mouth every 4 (four) hours as needed for moderate pain. 30 tablet 0  . levothyroxine (SYNTHROID, LEVOTHROID) 100 MCG tablet Take 100 mcg by mouth daily.      Marland Kitchen losartan-hydrochlorothiazide (HYZAAR) 50-12.5 MG tablet Take 1 tablet by mouth daily.    . promethazine (PHENERGAN) 25 MG tablet Take 1 tablet (25 mg total) by mouth every 8 (eight) hours as needed for nausea or vomiting. 15 tablet 0  . rivaroxaban (XARELTO) 20 MG TABS tablet Take 20 mg by mouth daily.    . rosuvastatin (CRESTOR) 20 MG tablet Take 20 mg by mouth daily.      . traZODone (DESYREL) 50 MG tablet Take 50 mg by mouth daily.     No current facility-administered medications for this visit.    REVIEW OF SYSTEMS:   Constitutional: Denies fevers, chills or abnormal night sweats Eyes: Denies blurriness of vision, double vision or watery eyes Ears, nose, mouth, throat, and face: Denies mucositis or sore throat Respiratory: Denies cough, dyspnea or wheezes Cardiovascular: Denies palpitation, chest discomfort or lower extremity swelling Gastrointestinal:  Denies nausea, heartburn or change in bowel habits Skin: Denies abnormal skin rashes Lymphatics: Denies new lymphadenopathy or easy bruising Neurological:Denies numbness, tingling or new weaknesses Behavioral/Psych: Mood is stable, no new changes  All other systems were reviewed with the patient and are negative.  PHYSICAL EXAMINATION: ECOG PERFORMANCE STATUS: 3   Filed Vitals:   05/29/15 1532  BP: 139/69  Pulse: 88  Temp: 98.5 F (36.9 C)  Resp: 18   Filed Weights   05/29/15 1532  Weight: 189 lb 12.8 oz (86.093 kg)    GENERAL:alert, no distress and  comfortable SKIN: skin color, texture, turgor are normal, no rashes or significant lesions EYES: normal, conjunctiva are pink and non-injected, sclera clear OROPHARYNX:no exudate, no erythema and lips, buccal mucosa, and tongue normal  NECK: supple, thyroid normal size, non-tender, without nodularity LYMPH:  no palpable lymphadenopathy in the cervical, axillary or inguinal LUNGS: clear to auscultation and percussion with normal breathing effort HEART: regular rate & rhythm and no murmurs and no lower extremity edema ABDOMEN:abdomen soft, non-tender and normal bowel sounds. (+) surgical incision sites are healing well. Musculoskeletal:no cyanosis of digits and no clubbing  PSYCH: alert & oriented x 3 with fluent speech NEURO: no focal motor/sensory deficits  LABORATORY DATA:  I have reviewed the data as listed Lab Results  Component  Value Date   WBC 8.4 05/09/2015   HGB 9.3* 05/09/2015   HCT 29.9* 05/09/2015   MCV 82.8 05/09/2015   PLT 270 05/09/2015    Recent Labs  03/02/15 2054 03/24/15 1608  04/28/15 0714 04/29/15 0324 05/09/15 0555  NA 140 141  < > 138 141 140  K 3.8 3.5  < > 3.3* 3.2* 2.8*  CL 101 101  < > 98* 103 97*  CO2 30 32  < > 32 32 31  GLUCOSE 121* 101*  < > 111* 116* 112*  BUN 12 11  < > 10 9 9   CREATININE 0.81 0.78  < > 0.96 0.72 0.84  CALCIUM 9.6 9.6  < > 9.1 8.4* 9.1  GFRNONAA >60 >60  < > 56* >60 >60  GFRAA >60 >60  < > >60 >60 >60  PROT 6.9 6.6  --   --   --  5.8*  ALBUMIN 3.9 3.8  --   --   --  2.9*  AST 29 23  --   --   --  25  ALT 22 21  --   --   --  14  ALKPHOS 45 40  --   --   --  41  BILITOT 0.4 0.5  --   --   --  0.4  < > = values in this interval not displayed.  PATHOLOGY REPORT  Diagnosis 04/28/2015 Colon, segmental resection for tumor, Distal illium and cecum - MIXED GOBLET CELL CARCINOID/CARCINOMA-ADENOCARCINOMA, SEE COMMENT. - TUMOR INVOLVES APPENDICEAL OS. - TUMOR INVOLVES FIBROFATTY SOFT TISSUE. AROUND THE APPENDICEAL OS - ONE  LYMPH NODE, POSITIVE FOR METASTATIC TUMOR (1/5). - SURGICAL MARGINS, NEGATIVE FOR TUMOR. Microscopic Comment The previous appendectomy demonstrating mixed goblet cell carcinoid/carcinoma-adenocarcinoma is noted [id number] Please see previous case for additional tumor information. (CRR:ecj 04/29/2015)  Diagnosis 04/11/2015 Appendix, Other than Incidental - MIXED GOBLET CELL CARCINOID-ADENOCARCINOMA, GRADE 3, SPANNING 4.5 CM. - TUMOR INVADES THROUGH SEROSA. - PERINEURAL INVASION PRESENT. - RESECTION MARGIN IS POSITIVE. - SEE ONCOLOGY TABLE. Microscopic Comment APPENDIX: Specimen: Appendix. Procedure: Appendectomy. Specimen Integrity: Disrupted.  Specimen Size: 4.5 x 3.0 x 1.9 cm. Tumor Site: Diffuse involving appendix. Tumor Size: Approximately 4.5 cm. Histologic Type: Mixed goblet cell carcinoma-adenocarcinoma. Histologic Grade: Grade 3 (poorly differentiated) Microscopic Tumor Extension: Through serosa. Margins: Proximal Margin: Involved. Mesenteric Margin: N/A. Adenoma present at proximal margin: No. Lymph-Vascular Invasion: Not identified. Perineural Invasion: Present. Peritumoral Nodules (tumor deposits): Not identified. Lymph nodes: number examined 0; number positive: 0 TNM: pT4, pNX Ancillary studies: Can be performed upon request. Comments: The tumor consists of infiltrating nests and single goblet cells. Additionally there are areas of conventional gland forming adenocarcinoma. Immunohistochemistry reveals the goblet cells are positive for synaptophysin, chromogranin, and CD56. The tumor diffusely involves the appendix from the tip to the resection margin and is thus estimated at 4.5 cm in size. There is an area of possible perforation, although this is difficult to assess with the disrupted nature of the specimen. Tumor invades through the serosa. Dr. Donato Heinz has reviewed the case. The case was called to Dr. Ninfa Linden on 04/15/2015.    RADIOGRAPHIC STUDIES: I have  personally reviewed the radiological images as listed and agreed with the findings in the report. Ct Head Wo Contrast  05/09/2015  CLINICAL DATA:  Confusion EXAM: CT HEAD WITHOUT CONTRAST TECHNIQUE: Contiguous axial images were obtained from the base of the skull through the vertex without intravenous contrast. COMPARISON:  MR brain 03/20/2014 FINDINGS: There is no evidence  of mass effect, midline shift, or extra-axial fluid collections. There is no evidence of a space-occupying lesion or intracranial hemorrhage. There is no evidence of a cortical-based area of acute infarction. There is generalized cerebral atrophy. There is periventricular white matter low attenuation likely secondary to microangiopathy. The ventricles and sulci are appropriate for the patient's age. The basal cisterns are patent. Visualized portions of the orbits are unremarkable. The visualized portions of the paranasal sinuses and mastoid air cells are unremarkable. Cerebrovascular atherosclerotic calcifications are noted. The osseous structures are unremarkable. IMPRESSION: 1. No acute intracranial pathology. 2. Chronic microvascular disease and cerebral atrophy. Electronically Signed   By: Kathreen Devoid   On: 05/09/2015 13:38   Ct Abdomen Pelvis W Contrast  05/09/2015  CLINICAL DATA:  79 year old female with acute abdominal and pelvic pain. 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 EXAM: CT ABDOMEN AND PELVIS WITH CONTRAST TECHNIQUE: Multidetector CT imaging of the abdomen and pelvis was performed using the standard protocol following bolus administration of intravenous contrast. CONTRAST:  158mL OMNIPAQUE IOHEXOL 300 MG/ML  SOLN COMPARISON:  03/03/2015 CT. FINDINGS: Lower chest: Mild cardiomegaly and cardiac surgical changes noted. Minimal bibasilar atelectasis identified. Hepatobiliary: The liver is unremarkable. The patient is status post cholecystectomy. There is no evidence of biliary dilatation. Pancreas:  Unremarkable Spleen: Unremarkable Adrenals/Urinary Tract: Bilateral renal cortical thinning noted. The kidneys are otherwise unremarkable. The adrenal glands and bladder are unremarkable. Stomach/Bowel: Right ileocolectomy changes identified. There is no evidence of focal abscess, pneumoperitoneum, free fluid or bowel obstruction. There is no evidence of focal bowel wall thickening. Vascular/Lymphatic: No enlarged lymph nodes noted. Aortic atherosclerotic calcifications present without aneurysm. The visualized mesenteric vasculature are patent. Reproductive: The uterus and adnexal regions are within normal limits. Other: Postoperative changes ill-defined fluid in the anterior abdominal/ pelvic fat noted. Musculoskeletal: No acute abnormalities. Moderate severe degenerative changes in the lumbar spine again identified. IMPRESSION: Right ileocolectomy changes without complicating features within the abdomen/ pelvis. No evidence of free fluid, abscess, pneumoperitoneum or bowel obstruction. Postoperative changes and ill-defined fluid in the anterior abdominal/pelvic fat. This likely is normal post surgical changes but correlate with signs of infection. Mild cardiomegaly and abdominal aortic atherosclerosis. Electronically Signed   By: Margarette Canada M.D.   On: 05/09/2015 10:51    ASSESSMENT & PLAN:  78 yo Caucasian female  1. Mixed goblet cell carcinoid-adenocarcinoma of appendix, pT4aN1Mx, at least stage IIIB -I reviewed her surgical pathology results with her in great details. The stage was also discussed with her. -I'll like to obtain a CT chest to ruled out thoracic metastasis, to complete her staging. -we discussed that appendiceal mixed carcinoid and adenocarcinoma is rare type tumor, and we do not have large body of clinical trial data to guild [place] about the adjuvant chemotherapy and surveillance. -due to the component of adenocarcinoma, and advanced stage, high risk features including a T4 1 disease,  positive nodes, and possible appendiceal perforation, I do think her risk of recurrence is moderate to high. -I recommend her to consider single agent Xeloda as adjuvant chemotherapy to reduce her risk of cancer recurrence. Potential benefits and side effects, especially fatigue, cytopenia, nausea, poor appetite, diarrhea, skin reactions, etc. Were discussed with her in details. -given her advanced age and multiple medical comorbidities, I do not think she is a candidate for FOLFOX. -We'll tentatively, giving her advanced age, limited performance status, and our limited data of adjuvant chemotherapy 4 appendiceal mixed carcinoid and adenocarcinoma, surveillance alone is also reasonable. -Patient and her  son had many questions, all answered to their satisfaction. She would like to think about the chemotherapy, and discussed again on her next visit.  2. Anemia -likely related to her acute blood loss from surgery. -We'll check a ferritin and iron study to rule out iron deficiency -Repeat CBC on next visit.   2. HTN, hypothyroidism, depression, CHF -She'll continue follow-up with her primary care physician  Plan -CT of chest in the next few weeks -Chemotherapy class -Lab work next visit -I'll see her back in 3 weeks for follow-up and decide about her adjuvant chemotherapy  All questions were answered. The patient knows to call the clinic with any problems, questions or concerns. I spent 55 minutes counseling the patient face to face. The total time spent in the appointment was 60 minutes and more than 50% was on counseling.     Truitt Merle, MD 05/29/2015 8:24 AM

## 2015-05-29 NOTE — Progress Notes (Signed)
Oncology Nurse Navigator Documentation  Oncology Nurse Navigator Flowsheets 05/29/2015  Navigator Encounter Type Initial MedOnc  Patient Visit Type Medonc  Treatment Phase Treatment planning  Barriers/Navigation Needs Family concerns;Education  Education Understanding Cancer/ Treatment Options;Coping with Diagnosis/ Prognosis;Preparing for Upcoming Surgery/ Treatment;Newly Diagnosed Cancer Education  Interventions Education Method  Education Method Verbal;Written;Teach-back  Support Groups/Services GI;ACS-Personal Health Manager  Time Spent with Patient 61  Met with patient and son, Pieter Partridge during new patient visit. Explained the role of the GI Nurse Navigator and provided New Patient Packet with information on: 1. Appendix cancer & Xeloda 2. Support groups 3. Fall Safety Plan Answered questions, reviewed current treatment plan using TEACH back and provided emotional support. Provided copy of current treatment plan. Reviewed rationale for scan and tumor markers. Explained the prior authorization process for scan and chemo pills. After scan is scheduled, will arrange for chemo class and labs to be on same day. Currently in facility for rehab, but when she returns home her son will stay with her during the day (can work at home) and his wife will spend night with her. Facility will arrange for home health nurse and PT/OT also. Will place no referrals at this time since she is eating well and getting stronger. No barriers to care at this time.  Merceda Elks, RN, BSN GI Oncology [place]

## 2015-06-02 ENCOUNTER — Telehealth: Payer: Self-pay | Admitting: Cardiovascular Disease

## 2015-06-02 NOTE — Telephone Encounter (Signed)
New message      FYI Pt is in a skilled nursing facility.  Calling to let you know that the social worker may be calling us.  They are hoping pt will be home this weekend and adv home care start seeing her.

## 2015-06-03 NOTE — Telephone Encounter (Signed)
NOTED  WILL AWAIT FOR RETURN CALL  ./CY

## 2015-06-10 ENCOUNTER — Telehealth: Payer: Self-pay | Admitting: Hematology

## 2015-06-10 ENCOUNTER — Other Ambulatory Visit: Payer: Self-pay | Admitting: Cardiovascular Disease

## 2015-06-10 ENCOUNTER — Telehealth: Payer: Self-pay | Admitting: Oncology

## 2015-06-10 NOTE — Telephone Encounter (Signed)
Opened in error

## 2015-06-10 NOTE — Telephone Encounter (Signed)
Ched appointment added by navigator for after 12/8 f/u with YF. Patient son given other appointments at 11/17 visit.

## 2015-06-11 ENCOUNTER — Ambulatory Visit: Payer: Medicare Other | Admitting: Oncology

## 2015-06-11 NOTE — Telephone Encounter (Signed)
Looks like patient was taken off of this medication, but just wanted to clarify. Please advise. Thanks, MI

## 2015-06-12 ENCOUNTER — Other Ambulatory Visit (HOSPITAL_BASED_OUTPATIENT_CLINIC_OR_DEPARTMENT_OTHER): Payer: Medicare Other

## 2015-06-12 ENCOUNTER — Other Ambulatory Visit: Payer: Self-pay | Admitting: *Deleted

## 2015-06-12 ENCOUNTER — Ambulatory Visit (HOSPITAL_COMMUNITY)
Admission: RE | Admit: 2015-06-12 | Discharge: 2015-06-12 | Disposition: A | Discharge status: Home / Self Care | Payer: Medicare Other | Source: Ambulatory Visit | Attending: Hematology | Admitting: Hematology

## 2015-06-12 DIAGNOSIS — C7A8 Other malignant neuroendocrine tumors: Secondary | ICD-10-CM

## 2015-06-12 DIAGNOSIS — C181 Malignant neoplasm of appendix: Secondary | ICD-10-CM | POA: Insufficient documentation

## 2015-06-12 DIAGNOSIS — M479 Spondylosis, unspecified: Secondary | ICD-10-CM | POA: Diagnosis not present

## 2015-06-12 DIAGNOSIS — J9811 Atelectasis: Secondary | ICD-10-CM | POA: Insufficient documentation

## 2015-06-12 DIAGNOSIS — N62 Hypertrophy of breast: Secondary | ICD-10-CM

## 2015-06-12 LAB — COMPREHENSIVE METABOLIC PANEL (CC13)
ALBUMIN: 3.2 g/dL — AB (ref 3.5–5.0)
ALK PHOS: 44 U/L (ref 40–150)
ALT: 14 U/L (ref 0–55)
AST: 18 U/L (ref 5–34)
Anion Gap: 10 mEq/L (ref 3–11)
BUN: 10 mg/dL (ref 7.0–26.0)
CALCIUM: 9.3 mg/dL (ref 8.4–10.4)
CO2: 25 mEq/L (ref 22–29)
CREATININE: 0.9 mg/dL (ref 0.6–1.1)
Chloride: 102 mEq/L (ref 98–109)
EGFR: 64 mL/min/{1.73_m2} — ABNORMAL LOW (ref >90)
Glucose: 132 mg/dl (ref 70–140)
POTASSIUM: 3.5 meq/L (ref 3.5–5.1)
Sodium: 137 mEq/L (ref 136–145)
Total Bilirubin: 0.56 mg/dL (ref 0.20–1.20)
Total Protein: 6.5 g/dL (ref 6.4–8.3)

## 2015-06-12 LAB — CBC WITH DIFFERENTIAL/PLATELET
BASO%: 0.2 % (ref 0.0–2.0)
Basophils Absolute: 0 10*3/uL (ref 0.0–0.1)
EOS%: 2.1 % (ref 0.0–7.0)
Eosinophils Absolute: 0.2 10*3/uL (ref 0.0–0.5)
HEMATOCRIT: 31.7 % — AB (ref 34.8–46.6)
HEMOGLOBIN: 9.8 g/dL — AB (ref 11.6–15.9)
LYMPH#: 2.4 10*3/uL (ref 0.9–3.3)
LYMPH%: 27.3 % (ref 14.0–49.7)
MCH: 23.3 pg — AB (ref 25.1–34.0)
MCHC: 31 g/dL — ABNORMAL LOW (ref 31.5–36.0)
MCV: 75.1 fL — AB (ref 79.5–101.0)
MONO#: 0.6 10*3/uL (ref 0.1–0.9)
MONO%: 7 % (ref 0.0–14.0)
NEUT%: 63.4 % (ref 38.4–76.8)
NEUTROS ABS: 5.6 10*3/uL (ref 1.5–6.5)
Platelets: 382 10*3/uL (ref 145–400)
RBC: 4.23 10*6/uL (ref 3.70–5.45)
RDW: 18 % — AB (ref 11.2–14.5)
WBC: 8.8 10*3/uL (ref 3.9–10.3)

## 2015-06-13 LAB — CEA: CEA: 1.2 ng/mL (ref 0.0–5.0)

## 2015-06-19 ENCOUNTER — Ambulatory Visit (HOSPITAL_BASED_OUTPATIENT_CLINIC_OR_DEPARTMENT_OTHER): Payer: Medicare Other

## 2015-06-19 ENCOUNTER — Other Ambulatory Visit: Payer: Medicare Other

## 2015-06-19 ENCOUNTER — Ambulatory Visit (HOSPITAL_BASED_OUTPATIENT_CLINIC_OR_DEPARTMENT_OTHER): Payer: Medicare Other | Admitting: Hematology

## 2015-06-19 ENCOUNTER — Encounter: Payer: Self-pay | Admitting: Hematology

## 2015-06-19 ENCOUNTER — Telehealth: Payer: Self-pay | Admitting: *Deleted

## 2015-06-19 ENCOUNTER — Telehealth: Payer: Self-pay | Admitting: Hematology

## 2015-06-19 ENCOUNTER — Encounter: Payer: Self-pay | Admitting: *Deleted

## 2015-06-19 VITALS — BP 101/70 | HR 84 | Temp 98.0°F | Resp 17 | Ht 65.0 in | Wt 193.3 lb

## 2015-06-19 DIAGNOSIS — D500 Iron deficiency anemia secondary to blood loss (chronic): Secondary | ICD-10-CM

## 2015-06-19 DIAGNOSIS — D649 Anemia, unspecified: Secondary | ICD-10-CM

## 2015-06-19 DIAGNOSIS — I10 Essential (primary) hypertension: Secondary | ICD-10-CM

## 2015-06-19 DIAGNOSIS — I509 Heart failure, unspecified: Secondary | ICD-10-CM

## 2015-06-19 DIAGNOSIS — E039 Hypothyroidism, unspecified: Secondary | ICD-10-CM

## 2015-06-19 DIAGNOSIS — F329 Major depressive disorder, single episode, unspecified: Secondary | ICD-10-CM

## 2015-06-19 DIAGNOSIS — C181 Malignant neoplasm of appendix: Secondary | ICD-10-CM

## 2015-06-19 LAB — IRON AND TIBC
%SAT: 6 % — AB (ref 21–57)
Iron: 23 ug/dL — ABNORMAL LOW (ref 41–142)
TIBC: 368 ug/dL (ref 236–444)
UIBC: 345 ug/dL (ref 120–384)

## 2015-06-19 LAB — CHROMOGRANIN A: CHROMOGRANIN A: 59 ng/mL — AB (ref <=15)

## 2015-06-19 LAB — FERRITIN: Ferritin: 20 ng/ml (ref 9–269)

## 2015-06-19 NOTE — Telephone Encounter (Signed)
Spoke with son Nicole Kindred and informed him re:  Iron study results showed moderate iron deficiency.  OK to start oral iron pill as discussed at office visit today.  Nicole Kindred stated he will pick up iron and will have pt start taking med today.  Informed Nicole Kindred that repeated labs will be done at next office visit.  Nicole Kindred voiced understanding.

## 2015-06-19 NOTE — Progress Notes (Signed)
[place]  [phone]) [phone] Fax:([phone]) [phone]  Clinic Follow Up Note   Patient Care Team: Candi Leash, PA-C as PCP - General (Physician Assistant) Coralie Keens, MD as Consulting Physician (General Surgery) Gatha Mayer, MD as Consulting Physician (Gastroenterology) Josue Hector, MD as Consulting Physician (Cardiology) Tania Ade, RN as Registered Nurse 06/19/2015   CHIEF COMPLAINTS:  Follow up mixed carcinoids-adenocarcinoma of appendix  HISTORY OF PRESENTING ILLNESS (05/29/2015):  Rachel Conner 79 y.o. 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 She is accompanied by her son to the clinic today. She came in a wheelchair.  She presented with abdominal pain, intermittent nausea and vomiting, diarrhea, for a few months. She was seen at [facility] emergency room in September, and CT of abdomen and pelvis revealed dilated appendix. She was referred to general surgeon Dr. Belenda Cruise, who brought her to OR on 04/11/2015 for appendectomy. She tolerated surgery well. The surgical path reviewed mixed carcinoid and adenocarcinoma, with area of small perforation. She was brought back to the OR on 04/28/2015, and underwent right hemicolectomy.  She has been recovering well from her surgery. She still feels quite fatigued, she is able to do most ADLs, but not much other activities. She has physical therapist and occupational therapist coming to her house twice a week. Her appetite has improved, she is eating better. Her pain incision has been much improved.  CURRENT THERAPY: Observation  INTERIM HISTORY Rachel Conner returns for follow-up. She is accompanied by her son to the clinic today. She came in a wheelchair today. She has been recovering better lately. Her appetite and energy level has improved, she is able to tolerate more activity, but still feels quite fatigued, not able to do a lot. She is still in home PT  and OT program. She denies significant pain, nausea, or other symptoms. She has gained a few pounds back.   MEDICAL HISTORY:  Past Medical History  Diagnosis Date  . Arthritis     Osteoarthrosis  . CAD (coronary artery disease) 1999    CABG  . HLD (hyperlipidemia)   . HTN (hypertension)   . Hypothyroidism   . Colon polyps   . Hiatal hernia   . Peripheral neuropathy ([place])   . Anxiety   . Hemorrhoids   . Major depressive disorder ([place])   . MI (myocardial infarction) ([place])   . DVT (deep venous thrombosis) ([place])   . CHF (congestive heart failure) ([place])   . GERD (gastroesophageal reflux disease)   . Cancer [facility])     cancer of the appendix   . Confusion, postoperative     SURGICAL HISTORY: Past Surgical History  Procedure Laterality Date  . Carotid stent  07/1998    stent RCA   . Coronary stent placement  10/2001    circumflex stent  . Lumbar laminectomy      fusion 1971  . Thyroid surgery    . Cholecystectomy  2011  . Esophagogastroduodenoscopy    . Colonoscopy w/ biopsies    . Right thumb Right     Thumb Finger Release  . Coronary artery bypass graft  1999    6 vessel per pt.  . Laparoscopic appendectomy N/A 04/11/2015    Procedure: LAPAROSCOPIC APPENDECTOMY;  Surgeon: Coralie Keens, MD;  Location: [place];  Service: General;  Laterality: N/A;  Laparoscopic converted to open appendectomy   . Partial colectomy N/A 04/28/2015    Procedure: Ileocecectomy;  Surgeon: Coralie Keens, MD;  Location: [facility] OR;  Service: General;  Laterality: N/A;    SOCIAL HISTORY: Social History   Social History  . Marital Status: Married    Spouse Name: N/A  . Number of Children: 3  . Years of Education: N/A   Occupational History  . retired    Social History Main Topics  . Smoking status: Former Smoker -- 1.00 packs/day for 45 years    Quit date: 07/12/1997  . Smokeless tobacco: Never Used     Comment: quit smoking age 54  . Alcohol Use: No  . Drug Use: No  . Sexual Activity:  Not on file   Other Topics Concern  . Not on file   Social History Narrative   Widowed, lived [place] and his wife will stay with her after release from facility   Retired from being EMT/med tech   Originally from [place]   Able to perform ADL's independently    FAMILY HISTORY: Family History  Problem Relation Age of Onset  . Heart disease Father   . Heart disease Mother   . Heart disease Brother   . Heart disease Other     grandfather (side unknown)  . Colon cancer Neg Hx   . Cancer Sister     lumphoma    ALLERGIES:  is allergic to nitrogen; nitroglycerin; myrbetriq; and niaspan.  MEDICATIONS:  Current Outpatient Prescriptions  Medication Sig Dispense Refill  . ALPRAZolam (XANAX) 1 MG tablet Take 0.5 mg by mouth 2 (two) times daily.     Marland Kitchen aspirin 81 MG tablet Take 81 mg by mouth daily.    . Cholecalciferol (VITAMIN D-3) 5000 UNITS TABS Take 10,000 Units by mouth.    . esomeprazole (NEXIUM) 20 MG capsule Take 20 mg by mouth daily at 12 noon.    Marland Kitchen levothyroxine (SYNTHROID, LEVOTHROID) 100 MCG tablet Take 100 mcg by mouth daily.      Marland Kitchen losartan-hydrochlorothiazide (HYZAAR) 50-12.5 MG tablet Take 1 tablet by mouth daily.    . Melatonin 10 MG TABS Take 10 mg by mouth at bedtime.    . Multiple Vitamin (MULTIVITAMIN) tablet Take 1 tablet by mouth daily.    . ondansetron (ZOFRAN) 8 MG tablet Take 8 mg by mouth as needed for nausea or vomiting.    . rosuvastatin (CRESTOR) 20 MG tablet Take 20 mg by mouth daily.      . traZODone (DESYREL) 50 MG tablet Take 50 mg by mouth daily.    Alveda Reasons 20 MG TABS tablet     . docusate sodium (COLACE) 100 MG capsule Take 100 mg by mouth daily.    . ferrous sulfate 325 (65 FE) MG EC tablet Take 325 mg by mouth daily with breakfast.     No current facility-administered medications for this visit.    REVIEW OF SYSTEMS:   Constitutional: Denies fevers, chills or abnormal night sweats Eyes: Denies blurriness of vision, double vision or  watery eyes Ears, nose, mouth, throat, and face: Denies mucositis or sore throat Respiratory: Denies cough, dyspnea or wheezes Cardiovascular: Denies palpitation, chest discomfort or lower extremity swelling Gastrointestinal:  Denies nausea, heartburn or change in bowel habits Skin: Denies abnormal skin rashes Lymphatics: Denies new lymphadenopathy or easy bruising Neurological:Denies numbness, tingling or new weaknesses Behavioral/Psych: Mood is stable, no new changes  All other systems were reviewed with the patient and are negative.  PHYSICAL EXAMINATION: ECOG PERFORMANCE STATUS: 3   Filed Vitals:   06/19/15 0834  BP: 101/70  Pulse: 84  Temp: 98  F (36.7 C)  Resp: 17   Filed Weights   06/19/15 0834  Weight: 193 lb 4.8 oz (87.68 kg)    GENERAL:alert, no distress and comfortable SKIN: skin color, texture, turgor are normal, no rashes or significant lesions EYES: normal, conjunctiva are pink and non-injected, sclera clear OROPHARYNX:no exudate, no erythema and lips, buccal mucosa, and tongue normal  NECK: supple, thyroid normal size, non-tender, without nodularity LYMPH:  no palpable lymphadenopathy in the cervical, axillary or inguinal LUNGS: clear to auscultation and percussion with normal breathing effort HEART: regular rate & rhythm and no murmurs and no lower extremity edema ABDOMEN:abdomen soft, non-tender and normal bowel sounds. (+) surgical incision sites are healing well. Musculoskeletal:no cyanosis of digits and no clubbing  PSYCH: alert & oriented x 3 with fluent speech NEURO: no focal motor/sensory deficits  LABORATORY DATA:  I have reviewed the data as listed Lab Results  Component Value Date   WBC 8.8 06/12/2015   HGB 9.8* 06/12/2015   HCT 31.7* 06/12/2015   MCV 75.1* 06/12/2015   PLT 382 06/12/2015    Recent Labs  03/24/15 1608  04/28/15 0714 04/29/15 0324 05/09/15 0555 06/12/15 0818  NA 141  < > 138 141 140 137  K 3.5  < > 3.3* 3.2* 2.8* 3.5   CL 101  < > 98* 103 97*  --   CO2 32  < > 32 32 31 25  GLUCOSE 101*  < > 111* 116* 112* 132  BUN 11  < > 10 9 9  10.0  CREATININE 0.78  < > 0.96 0.72 0.84 0.9  CALCIUM 9.6  < > 9.1 8.4* 9.1 9.3  GFRNONAA >60  < > 56* >60 >60  --   GFRAA >60  < > >60 >60 >60  --   PROT 6.6  --   --   --  5.8* 6.5  ALBUMIN 3.8  --   --   --  2.9* 3.2*  AST 23  --   --   --  25 18  ALT 21  --   --   --  14 14  ALKPHOS 40  --   --   --  41 44  BILITOT 0.5  --   --   --  0.4 0.56  < > = values in this interval not displayed.  PATHOLOGY REPORT  Diagnosis 04/28/2015 Colon, segmental resection for tumor, Distal illium and cecum - MIXED GOBLET CELL CARCINOID/CARCINOMA-ADENOCARCINOMA, SEE COMMENT. - TUMOR INVOLVES APPENDICEAL OS. - TUMOR INVOLVES FIBROFATTY SOFT TISSUE. AROUND THE APPENDICEAL OS - ONE LYMPH NODE, POSITIVE FOR METASTATIC TUMOR (1/5). - SURGICAL MARGINS, NEGATIVE FOR TUMOR. Microscopic Comment The previous appendectomy demonstrating mixed goblet cell carcinoid/carcinoma-adenocarcinoma is noted [id number] Please see previous case for additional tumor information. (CRR:ecj 04/29/2015)  Diagnosis 04/11/2015 Appendix, Other than Incidental - MIXED GOBLET CELL CARCINOID-ADENOCARCINOMA, GRADE 3, SPANNING 4.5 CM. - TUMOR INVADES THROUGH SEROSA. - PERINEURAL INVASION PRESENT. - RESECTION MARGIN IS POSITIVE. - SEE ONCOLOGY TABLE. Microscopic Comment APPENDIX: Specimen: Appendix. Procedure: Appendectomy. Specimen Integrity: Disrupted.  Specimen Size: 4.5 x 3.0 x 1.9 cm. Tumor Site: Diffuse involving appendix. Tumor Size: Approximately 4.5 cm. Histologic Type: Mixed goblet cell carcinoma-adenocarcinoma. Histologic Grade: Grade 3 (poorly differentiated) Microscopic Tumor Extension: Through serosa. Margins: Proximal Margin: Involved. Mesenteric Margin: N/A. Adenoma present at proximal margin: No. Lymph-Vascular Invasion: Not identified. Perineural Invasion: Present. Peritumoral Nodules  (tumor deposits): Not identified. Lymph nodes: number examined 0; number positive: 0 TNM: pT4, pNX Ancillary studies: Can be performed  upon request. Comments: The tumor consists of infiltrating nests and single goblet cells. Additionally there are areas of conventional gland forming adenocarcinoma. Immunohistochemistry reveals the goblet cells are positive for synaptophysin, chromogranin, and CD56. The tumor diffusely involves the appendix from the tip to the resection margin and is thus estimated at 4.5 cm in size. There is an area of possible perforation, although this is difficult to assess with the disrupted nature of the specimen. Tumor invades through the serosa. Dr. Donato Heinz has reviewed the case. The case was called to Dr. Ninfa Linden on 04/15/2015.    RADIOGRAPHIC STUDIES: I have personally reviewed the radiological images as listed and agreed with the findings in the report. Ct Chest Wo Contrast  06/12/2015  CLINICAL DATA:  New appendiceal cancer. Initial treatment evaluation. EXAM: CT CHEST WITHOUT CONTRAST TECHNIQUE: Multidetector CT imaging of the chest was performed following the standard protocol without IV contrast. COMPARISON:  CT 05/09/2015 FINDINGS: Mediastinum/Nodes: No axillary supraclavicular lymphadenopathy. No mediastinal hilar lymphadenopathy. No pericardial fluid. Lungs/Pleura: Mild atelectasis lung bases. No suspicious nodularity. Upper abdomen: Limited view of the liver, kidneys, pancreas are unremarkable. Normal adrenal glands. Musculoskeletal: No aggressive. Midline sternotomy. Degenerate spurring. IMPRESSION: 1. No evidence of pulmonary metastasis. 2. Mild basilar atelectasis. 3. Degenerative changes spine. Electronically Signed   By: Suzy Bouchard M.D.   On: 06/12/2015 09:37    ASSESSMENT & PLAN:  79 yo Caucasian female  1. Mixed goblet cell carcinoid-adenocarcinoma of appendix, pT4aN1Mx, at least stage IIIB -I previously reviewed her surgical pathology results with  her in great details. The stage was also discussed with her. -Her reviewed her recent CT chest with patient and her son, there is no evidence of metastasis. -we discussed that appendiceal mixed carcinoid and adenocarcinoma is rare type tumor, and we do not have large body of clinical trial data to guild [place] about the adjuvant chemotherapy and surveillance. -due to the component of adenocarcinoma, and advanced stage, high risk features including a T4 1 disease, positive nodes, and possible appendiceal perforation, I do think her risk of recurrence is moderate to high. -I recommend her to consider single agent Xeloda as adjuvant chemotherapy to reduce her risk of cancer recurrence. Given her advanced age and multiple medical comorbidities, I do not think she is a candidate for FOLFOX. -Patient and her son are concerned about the side effects from chemotherapy, especially fatigue, which will likely impact her quality of life. After lengthy discussion, they declined chemotherapy. Given her advanced age, limited performance status, I think that a reasonable decision.  - we again reviewed the surveillance plan. I recommend her to follow up with me with lab every 3-4 months for the first 2 years, then every 6-12 months afterwards. Consider repeating CT scan in 6-12 months, and a colonoscopy in one year.  - I reviewed her lab results including CEA. Her chromogranin A and urine 5 HIAA results are still pending.  Patient's son does use my chart to follow-up her lab results.   2. Iron deficient anemia secondary to blood loss  -Her repeat his CBC today showed hemoglobin 9.8, which is slightly better. -Her on study today showed low ferritin level at 20, low serum iron level 23I and saturation 6% -I recommend her to start taking oral ferrous sulfate 1 tablet daily, potential side effects including constipation and stool color change with discussed with her. -We'll repeat her iron studies on next visit, if still low,  I'll consider IV iron.   2. HTN, hypothyroidism, depression, CHF -She'll  continue follow-up with her primary care physician  Plan -RTC in 3 months with lab   All questions were answered. The patient knows to call the clinic with any problems, questions or concerns. I spent 25 minutes counseling the patient face to face. The total time spent in the appointment was 30 minutes and more than 50% was on counseling.     Truitt Merle, MD 06/19/2015 10:40 PM

## 2015-06-19 NOTE — Telephone Encounter (Signed)
per pof to sch pt appt-gave pt copy of avs °

## 2015-06-19 NOTE — Progress Notes (Signed)
Oncology Nurse Navigator Documentation  Oncology Nurse Navigator Flowsheets 06/19/2015  Navigator Encounter Type Other-1 month f/u  Patient Visit Type Medonc  Treatment Phase Discuss chemotherapy  Barriers/Navigation Needs Education  Education Other-CEA, Chromogranin A, 5-HIAA test significance and procedure  Interventions Education Method  Education Method Verbal;Written;Teach-back  Support Groups/Services -  Time Spent with Patient 30  Has decided against chemotherapy at this time and prefers observation approach. Will start iron daily and call if not improved in 1 month. Baseline ferritin today. Has returned to her home and son stays with her during the day and his wife spends the nights with her. Has PT/OT/RN coming to home twice weekly.

## 2015-06-20 LAB — 5 HIAA, QUANTITATIVE, URINE, 24 HOUR
5-HIAA, URINE: 0.9 mg/(24.h) (ref <=6.0)
Volume, Urine-5HIAA: 1300 mL/24 h

## 2015-06-23 ENCOUNTER — Encounter: Payer: Medicare Other | Admitting: Genetic Counselor

## 2015-06-23 ENCOUNTER — Other Ambulatory Visit: Payer: Medicare Other

## 2015-06-24 ENCOUNTER — Telehealth: Payer: Self-pay | Admitting: *Deleted

## 2015-06-24 ENCOUNTER — Encounter: Payer: Self-pay | Admitting: Hematology

## 2015-06-24 NOTE — Telephone Encounter (Signed)
VM left by son, Rachel Conner on 06/23/15-just retrieved this morning: Started ferrous sulfate daily as instructed-caused nausea and increase in constipation. Constipation resolved w/MOM and prune juice. Spoke w/PCP who suggested taking iron qod with breakfast. Wanted to notify Dr. Burr Medico to confirm she is OK with this approach. Will start QOD dosing on 06/25/15. Please call to confirm. Forwarded this message to collaborative nurse/Dr. Burr Medico.

## 2015-06-25 ENCOUNTER — Encounter: Payer: Self-pay | Admitting: Hematology

## 2015-07-13 ENCOUNTER — Other Ambulatory Visit: Payer: Self-pay | Admitting: Cardiovascular Disease

## 2015-07-13 DIAGNOSIS — C189 Malignant neoplasm of colon, unspecified: Secondary | ICD-10-CM

## 2015-07-13 HISTORY — DX: Malignant neoplasm of colon, unspecified: C18.9

## 2015-07-24 ENCOUNTER — Ambulatory Visit: Payer: Medicare Other | Admitting: Cardiovascular Disease

## 2015-09-17 ENCOUNTER — Encounter: Payer: Self-pay | Admitting: Hematology

## 2015-09-17 ENCOUNTER — Ambulatory Visit (HOSPITAL_BASED_OUTPATIENT_CLINIC_OR_DEPARTMENT_OTHER): Payer: Medicare Other | Admitting: Hematology

## 2015-09-17 ENCOUNTER — Encounter: Payer: Self-pay | Admitting: *Deleted

## 2015-09-17 ENCOUNTER — Other Ambulatory Visit (HOSPITAL_BASED_OUTPATIENT_CLINIC_OR_DEPARTMENT_OTHER): Payer: Medicare Other

## 2015-09-17 ENCOUNTER — Telehealth: Payer: Self-pay | Admitting: Hematology

## 2015-09-17 VITALS — BP 123/61 | HR 94 | Temp 97.9°F | Resp 18 | Ht 65.0 in | Wt 186.1 lb

## 2015-09-17 DIAGNOSIS — R55 Syncope and collapse: Secondary | ICD-10-CM

## 2015-09-17 DIAGNOSIS — D500 Iron deficiency anemia secondary to blood loss (chronic): Secondary | ICD-10-CM | POA: Diagnosis not present

## 2015-09-17 DIAGNOSIS — I509 Heart failure, unspecified: Secondary | ICD-10-CM | POA: Diagnosis not present

## 2015-09-17 DIAGNOSIS — R634 Abnormal weight loss: Secondary | ICD-10-CM

## 2015-09-17 DIAGNOSIS — C181 Malignant neoplasm of appendix: Secondary | ICD-10-CM

## 2015-09-17 DIAGNOSIS — E039 Hypothyroidism, unspecified: Secondary | ICD-10-CM | POA: Diagnosis not present

## 2015-09-17 DIAGNOSIS — F329 Major depressive disorder, single episode, unspecified: Secondary | ICD-10-CM

## 2015-09-17 DIAGNOSIS — C189 Malignant neoplasm of colon, unspecified: Secondary | ICD-10-CM

## 2015-09-17 DIAGNOSIS — I10 Essential (primary) hypertension: Secondary | ICD-10-CM | POA: Diagnosis not present

## 2015-09-17 LAB — COMPREHENSIVE METABOLIC PANEL
ALK PHOS: 50 U/L (ref 40–150)
ALT: 13 U/L (ref 0–55)
AST: 17 U/L (ref 5–34)
Albumin: 3.9 g/dL (ref 3.5–5.0)
Anion Gap: 12 mEq/L — ABNORMAL HIGH (ref 3–11)
BUN: 12.5 mg/dL (ref 7.0–26.0)
CALCIUM: 9.8 mg/dL (ref 8.4–10.4)
CHLORIDE: 101 meq/L (ref 98–109)
CO2: 28 mEq/L (ref 22–29)
Creatinine: 0.8 mg/dL (ref 0.6–1.1)
EGFR: 70 mL/min/{1.73_m2} — AB (ref >90)
Glucose: 101 mg/dl (ref 70–140)
POTASSIUM: 3.7 meq/L (ref 3.5–5.1)
Sodium: 142 mEq/L (ref 136–145)
Total Bilirubin: 0.42 mg/dL (ref 0.20–1.20)
Total Protein: 7.1 g/dL (ref 6.4–8.3)

## 2015-09-17 LAB — CBC WITH DIFFERENTIAL/PLATELET
BASO%: 0.2 % (ref 0.0–2.0)
BASOS ABS: 0 10*3/uL (ref 0.0–0.1)
EOS ABS: 0.2 10*3/uL (ref 0.0–0.5)
EOS%: 1.6 % (ref 0.0–7.0)
HEMATOCRIT: 42.7 % (ref 34.8–46.6)
HGB: 13.7 g/dL (ref 11.6–15.9)
LYMPH#: 2.2 10*3/uL (ref 0.9–3.3)
LYMPH%: 22.8 % (ref 14.0–49.7)
MCH: 28.2 pg (ref 25.1–34.0)
MCHC: 32.1 g/dL (ref 31.5–36.0)
MCV: 88 fL (ref 79.5–101.0)
MONO#: 0.8 10*3/uL (ref 0.1–0.9)
MONO%: 8.3 % (ref 0.0–14.0)
NEUT#: 6.5 10*3/uL (ref 1.5–6.5)
NEUT%: 67.1 % (ref 38.4–76.8)
PLATELETS: 303 10*3/uL (ref 145–400)
RBC: 4.85 10*6/uL (ref 3.70–5.45)
RDW: 17.4 % — ABNORMAL HIGH (ref 11.2–14.5)
WBC: 9.8 10*3/uL (ref 3.9–10.3)

## 2015-09-17 NOTE — Progress Notes (Signed)
Oncology Nurse Navigator Documentation  Oncology Nurse Navigator Flowsheets 09/17/2015  Navigator Location [facility]  Navigator Encounter Type Follow-up Appt;3 month  Abnormal Finding Date 04/28/2015  Confirmed Diagnosis Date 04/28/2015  Surgery Date 04/28/2015  Treatment Initiated Date (No Data)--patient declined  Patient Visit Type MedOnc;Follow-up  Treatment Phase Other--Observation  Barriers/Navigation Needs No barriers at this time;No Questions;No Needs  Education -  Interventions None required  Education Method -  Support Groups/Services -  Acuity Level 1  Time Spent with Patient 15  Briefly met with patient and her daughter after visit with MD. She has moved to a senior living facility-is adjusting-has not met any "friends" yet. Saying they're all too HOH to talk to and many are confused. Has a room that she can eat inside or go to a dining room if desired.

## 2015-09-17 NOTE — Progress Notes (Signed)
[place]  [phone] Fax:([phone]) [phone]  Clinic Follow Up Note   Patient Care Team: Candi Leash, PA-C as PCP - General (Physician Assistant) Coralie Keens, MD as Consulting Physician (General Surgery) Gatha Mayer, MD as Consulting Physician (Gastroenterology) Josue Hector, MD as Consulting Physician (Cardiology) Tania Ade, RN as Registered Nurse 09/17/2015   CHIEF COMPLAINTS:  Follow up mixed carcinoids-adenocarcinoma of appendix  HISTORY OF PRESENTING ILLNESS (05/29/2015):  Rachel Conner 80 y.o. 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 She is accompanied by her son to the clinic today. She came in a wheelchair.  She presented with abdominal pain, intermittent nausea and vomiting, diarrhea, for a few months. She was seen at [facility] emergency room in September, and CT of abdomen and pelvis revealed dilated appendix. She was referred to general surgeon Dr. Belenda Cruise, who brought her to OR on 04/11/2015 for appendectomy. She tolerated surgery well. The surgical path reviewed mixed carcinoid and adenocarcinoma, with area of small perforation. She was brought back to the OR on 04/28/2015, and underwent right hemicolectomy.  She has been recovering well from her surgery. She still feels quite fatigued, she is able to do most ADLs, but not much other activities. She has physical therapist and occupational therapist coming to her house twice a week. Her appetite has improved, she is eating better. Her pain incision has been much improved.  CURRENT THERAPY: Observation  INTERIM HISTORY Ms. Baldner returns for follow-up. She is accompanied by her daughter to the clinic today. She has recently moved to a senior living facility due to her memory issue, she states she does not like the food over there, and has lost quite a bit of weight (30lbs since 04/2015). She has a mild to moderate fatigue, able to  tolerate routine activities including exercise well. No other complaints. She denies any abdominal discomfort, bloating, nausea, or change of her bowel habits. No hematochezia or other bleeding.  MEDICAL HISTORY:  Past Medical History  Diagnosis Date  . Arthritis     Osteoarthrosis  . CAD (coronary artery disease) 1999    CABG  . HLD (hyperlipidemia)   . HTN (hypertension)   . Hypothyroidism   . Colon polyps   . Hiatal hernia   . Peripheral neuropathy ([place])   . Anxiety   . Hemorrhoids   . Major depressive disorder ([place])   . MI (myocardial infarction) ([place])   . DVT (deep venous thrombosis) ([place])   . CHF (congestive heart failure) ([place])   . GERD (gastroesophageal reflux disease)   . Cancer [facility])     cancer of the appendix   . Confusion, postoperative     SURGICAL HISTORY: Past Surgical History  Procedure Laterality Date  . Carotid stent  07/1998    stent RCA   . Coronary stent placement  10/2001    circumflex stent  . Lumbar laminectomy      fusion 1971  . Thyroid surgery    . Cholecystectomy  2011  . Esophagogastroduodenoscopy    . Colonoscopy w/ biopsies    . Right thumb Right     Thumb Finger Release  . Coronary artery bypass graft  1999    6 vessel per pt.  . Laparoscopic appendectomy N/A 04/11/2015    Procedure: LAPAROSCOPIC APPENDECTOMY;  Surgeon: Coralie Keens, MD;  Location: [place];  Service: General;  Laterality: N/A;  Laparoscopic converted to open appendectomy   . Partial colectomy N/A 04/28/2015  Procedure: Ileocecectomy;  Surgeon: Coralie Keens, MD;  Location: [place];  Service: General;  Laterality: N/A;    SOCIAL HISTORY: Social History   Social History  . Marital Status: Married    Spouse Name: N/A  . Number of Children: 3  . Years of Education: N/A   Occupational History  . retired    Social History Main Topics  . Smoking status: Former Smoker -- 1.00 packs/day for 45 years    Quit date: 07/12/1997  . Smokeless tobacco: Never Used      Comment: quit smoking age 41  . Alcohol Use: No  . Drug Use: No  . Sexual Activity: Not on file   Other Topics Concern  . Not on file   Social History Narrative   Widowed, lived [place] and his wife will stay with her after release from facility   Retired from being EMT/med tech   Originally from [place]   Able to perform ADL's independently    FAMILY HISTORY: Family History  Problem Relation Age of Onset  . Heart disease Father   . Heart disease Mother   . Heart disease Brother   . Heart disease Other     grandfather (side unknown)  . Colon cancer Neg Hx   . Cancer Sister     lumphoma    ALLERGIES:  is allergic to nitrogen; nitroglycerin; myrbetriq; and niaspan.  MEDICATIONS:  Current Outpatient Prescriptions  Medication Sig Dispense Refill  . amLODipine (NORVASC) 10 MG tablet Take 10 mg by mouth daily.    Marland Kitchen apixaban (ELIQUIS) 5 MG TABS tablet Take 5 mg by mouth 2 (two) times daily.    Marland Kitchen aspirin 81 MG tablet Take 81 mg by mouth daily.    Marland Kitchen atorvastatin (LIPITOR) 80 MG tablet Take 80 mg by mouth daily.    Marland Kitchen docusate sodium (COLACE) 100 MG capsule Take 100 mg by mouth daily.    Marland Kitchen esomeprazole (NEXIUM) 20 MG capsule Take 20 mg by mouth daily at 12 noon.    . ferrous sulfate 325 (65 FE) MG EC tablet Take 325 mg by mouth daily with breakfast.    . levothyroxine (SYNTHROID, LEVOTHROID) 100 MCG tablet Take 100 mcg by mouth daily.      Marland Kitchen losartan-hydrochlorothiazide (HYZAAR) 50-12.5 MG tablet Take 1 tablet by mouth daily. 90 tablet 2  . Melatonin 10 MG TABS Take 10 mg by mouth at bedtime.    . Multiple Vitamin (MULTIVITAMIN) tablet Take 1 tablet by mouth daily.    Marland Kitchen omega-3 acid ethyl esters (LOVAZA) 1 g capsule Take 1 g by mouth daily.    . ondansetron (ZOFRAN) 8 MG tablet Take 8 mg by mouth as needed for nausea or vomiting.    . pantoprazole (PROTONIX) 40 MG tablet Take 40 mg by mouth daily.    . traZODone (DESYREL) 50 MG tablet Take 50 mg by mouth daily.    Alveda Reasons 20  MG TABS tablet     . Cholecalciferol (VITAMIN D-3) 5000 UNITS TABS Take 10,000 Units by mouth.    . venlafaxine XR (EFFEXOR-XR) 75 MG 24 hr capsule Take 75 mg by mouth daily.     No current facility-administered medications for this visit.    REVIEW OF SYSTEMS:   Constitutional: Denies fevers, chills or abnormal night sweats Eyes: Denies blurriness of vision, double vision or watery eyes Ears, nose, mouth, throat, and face: Denies mucositis or sore throat Respiratory: Denies cough, dyspnea or wheezes Cardiovascular: Denies palpitation, chest discomfort or lower  extremity swelling Gastrointestinal:  Denies nausea, heartburn or change in bowel habits Skin: Denies abnormal skin rashes Lymphatics: Denies new lymphadenopathy or easy bruising Neurological:Denies numbness, tingling or new weaknesses Behavioral/Psych: Mood is stable, no new changes  All other systems were reviewed with the patient and are negative.  PHYSICAL EXAMINATION: ECOG PERFORMANCE STATUS: 1  Filed Vitals:   09/17/15 0856  BP: 123/61  Pulse: 94  Temp: 97.9 F (36.6 C)  Resp: 18   Filed Weights   09/17/15 0856  Weight: 186 lb 1.6 oz (84.414 kg)    GENERAL:alert, no distress and comfortable SKIN: skin color, texture, turgor are normal, no rashes or significant lesions EYES: normal, conjunctiva are pink and non-injected, sclera clear OROPHARYNX:no exudate, no erythema and lips, buccal mucosa, and tongue normal  NECK: supple, thyroid normal size, non-tender, without nodularity LYMPH:  no palpable lymphadenopathy in the cervical, axillary or inguinal LUNGS: clear to auscultation and percussion with normal breathing effort HEART: regular rate & rhythm and no murmurs and no lower extremity edema ABDOMEN:abdomen soft, non-tender and normal bowel sounds. (+) surgical incision sites are healing well. Musculoskeletal:no cyanosis of digits and no clubbing  PSYCH: alert & oriented x 3 with fluent speech NEURO: no  focal motor/sensory deficits  LABORATORY DATA:  I have reviewed the data as listed Lab Results  Component Value Date   WBC 9.8 09/17/2015   HGB 13.7 09/17/2015   HCT 42.7 09/17/2015   MCV 88.0 09/17/2015   PLT 303 09/17/2015    Recent Labs  03/24/15 1608  04/28/15 0714 04/29/15 0324 05/09/15 0555 06/12/15 0818  NA 141  < > 138 141 140 137  K 3.5  < > 3.3* 3.2* 2.8* 3.5  CL 101  < > 98* 103 97*  --   CO2 32  < > 32 32 31 25  GLUCOSE 101*  < > 111* 116* 112* 132  BUN 11  < > 10 9 9  10.0  CREATININE 0.78  < > 0.96 0.72 0.84 0.9  CALCIUM 9.6  < > 9.1 8.4* 9.1 9.3  GFRNONAA >60  < > 56* >60 >60  --   GFRAA >60  < > >60 >60 >60  --   PROT 6.6  --   --   --  5.8* 6.5  ALBUMIN 3.8  --   --   --  2.9* 3.2*  AST 23  --   --   --  25 18  ALT 21  --   --   --  14 14  ALKPHOS 40  --   --   --  41 44  BILITOT 0.5  --   --   --  0.4 0.56  < > = values in this interval not displayed.  Results for AJANI, NEUBAUER (MRN [id number]) as of 09/17/2015 06:45  Ref. Range 06/19/2015 09:30  Iron Latest Ref Range: 41-142 ug/dL 23 (L)  UIBC Latest Ref Range: 120-384 ug/dL 345  TIBC Latest Ref Range: 236-444 ug/dL 368  %SAT Latest Ref Range: 21-57 % 6 (L)  Ferritin Latest Ref Range: 9-269 ng/ml 20   Results for ELISE, COOP (MRN [id number]) as of 09/17/2015 06:45  Ref. Range 06/12/2015 08:18 06/12/2015 08:18  CEA Latest Ref Range: 0.0-5.0 ng/mL  1.2  Chromogranin A Latest Ref Range: <=15 ng/mL 59 (H)    5 HIAA, quantitative, urine, 24 hour  Status: Finalresult Visible to patient:  MyChart Nextappt: Today at 08:15 AM in Oncology [facility] 4) Dx:  Cancer of appendix ([place])         Ref Range 26mo ago    5-HIAA, 24 Hr Urine <=6.0 mg/24 h 0.9   Volume, Urine-5HIAA mL/24 h 1300          PATHOLOGY REPORT  Diagnosis 04/28/2015 Colon, segmental resection for tumor, Distal illium and cecum - MIXED GOBLET CELL CARCINOID/CARCINOMA-ADENOCARCINOMA, SEE COMMENT. -  TUMOR INVOLVES APPENDICEAL OS. - TUMOR INVOLVES FIBROFATTY SOFT TISSUE. AROUND THE APPENDICEAL OS - ONE LYMPH NODE, POSITIVE FOR METASTATIC TUMOR (1/5). - SURGICAL MARGINS, NEGATIVE FOR TUMOR. Microscopic Comment The previous appendectomy demonstrating mixed goblet cell carcinoid/carcinoma-adenocarcinoma is noted [id number]). Please see previous case for additional tumor information. (CRR:ecj 04/29/2015)  Diagnosis 04/11/2015 Appendix, Other than Incidental - MIXED GOBLET CELL CARCINOID-ADENOCARCINOMA, GRADE 3, SPANNING 4.5 CM. - TUMOR INVADES THROUGH SEROSA. - PERINEURAL INVASION PRESENT. - RESECTION MARGIN IS POSITIVE. - SEE ONCOLOGY TABLE. Microscopic Comment APPENDIX: Specimen: Appendix. Procedure: Appendectomy. Specimen Integrity: Disrupted.  Specimen Size: 4.5 x 3.0 x 1.9 cm. Tumor Site: Diffuse involving appendix. Tumor Size: Approximately 4.5 cm. Histologic Type: Mixed goblet cell carcinoma-adenocarcinoma. Histologic Grade: Grade 3 (poorly differentiated) Microscopic Tumor Extension: Through serosa. Margins: Proximal Margin: Involved. Mesenteric Margin: N/A. Adenoma present at proximal margin: No. Lymph-Vascular Invasion: Not identified. Perineural Invasion: Present. Peritumoral Nodules (tumor deposits): Not identified. Lymph nodes: number examined 0; number positive: 0 TNM: pT4, pNX Ancillary studies: Can be performed upon request. Comments: The tumor consists of infiltrating nests and single goblet cells. Additionally there are areas of conventional gland forming adenocarcinoma. Immunohistochemistry reveals the goblet cells are positive for synaptophysin, chromogranin, and CD56. The tumor diffusely involves the appendix from the tip to the resection margin and is thus estimated at 4.5 cm in size. There is an area of possible perforation, although this is difficult to assess with the disrupted nature of the specimen. Tumor invades through the serosa. Dr. Donato Heinz has  reviewed the case. The case was called to Dr. Ninfa Linden on 04/15/2015.    RADIOGRAPHIC STUDIES: I have personally reviewed the radiological images as listed and agreed with the findings in the report. No results found.  ASSESSMENT & PLAN:  80 yo Caucasian female  1. Mixed goblet cell carcinoid-adenocarcinoma of appendix, pT4aN1Mx, at least stage IIIB -I previously reviewed her surgical pathology results with her in great details. The stage was also discussed with her. -Her reviewed her recent CT chest with patient and her son, there is no evidence of metastasis. -we discussed that appendiceal mixed carcinoid and adenocarcinoma is rare type tumor, and we do not have large body of clinical trial data to guild [place] about the adjuvant chemotherapy and surveillance. -due to the component of adenocarcinoma, and advanced stage, high risk features including a T4 1 disease, positive nodes, and possible appendiceal perforation, I do think her risk of recurrence is moderate to high. -I recommend her to consider single agent Xeloda as adjuvant chemotherapy to reduce her risk of cancer recurrence. Given her advanced age and multiple medical comorbidities, I do not think she is a candidate for FOLFOX. -Patient and her son are concerned about the side effects from chemotherapy, especially fatigue, which will likely impact her quality of life. After lengthy discussion, they declined chemotherapy. Given her advanced age, limited performance status, I think that a reasonable decision.  -She is clinically doing well, lab results reviewed with her, her anemia has resolved. CBC is normal. Other lab test results are still pending. -She had elevated chromogranin A  3 months ago, urine 5  HIAA was normal. we'll continue monitoring. -Physical exam today was unremarkable. We'll continue surveillance. We'll obtain a surveillance CT scan in 3 months, sooner if she has significant increasing of her tumor markers.  2. Iron  deficient anemia secondary to blood loss  -Her anemia resolved for now. -Continue oral iron   2. HTN, hypothyroidism, depression, CHF -She'll continue follow-up with her primary care physician  Plan -RTC in 3 months with lab and CT CAP with contrast -I'll call her with the tumor marker test results. If significant elevated, will consider obtaining a CT scan earlier.  All questions were answered. The patient knows to call the clinic with any problems, questions or concerns. I spent 25 minutes counseling the patient face to face. The total time spent in the appointment was 30 minutes and more than 50% was on counseling.     Truitt Merle, MD 09/17/2015 9:28 AM

## 2015-09-17 NOTE — Telephone Encounter (Signed)
Gave and printed appt sched and avs fo rpt for June...gv barium °

## 2015-09-18 LAB — CEA: CEA: 3.6 ng/mL (ref 0.0–4.7)

## 2015-09-19 ENCOUNTER — Telehealth: Payer: Self-pay | Admitting: *Deleted

## 2015-09-19 LAB — 5 HIAA, QUANTITATIVE, URINE, 24 HOUR
5-HIAA, URINE: 1.3 mg/L
5-HIAA,Quant.,24 Hr Urine: 3 mg/24 hr (ref 0.0–14.9)

## 2015-09-19 LAB — CHROMOGRANIN A: Chromogranin A: 6 nmol/L — ABNORMAL HIGH (ref 0–5)

## 2015-09-19 NOTE — Telephone Encounter (Signed)
Called daughter, Jeani Hawking & reported per Dr Burr Medico that CEA was normal.  She was pleased & will report to her mother.

## 2015-09-22 LAB — CHROMOGRANIN A (PARALLEL TESTING): CHROMOGRANIN A: 50 ng/mL — AB (ref <=15)

## 2015-09-22 LAB — CEA (PARALLEL TESTING): CEA: 2.3 ng/mL

## 2015-11-05 ENCOUNTER — Emergency Department (HOSPITAL_COMMUNITY)
Admission: EM | Admit: 2015-11-05 | Discharge: 2015-11-05 | Disposition: A | Discharge status: Home / Self Care | Payer: Medicare Other | Attending: Emergency Medicine | Admitting: Emergency Medicine

## 2015-11-05 ENCOUNTER — Encounter (HOSPITAL_COMMUNITY): Payer: Self-pay

## 2015-11-05 DIAGNOSIS — Z951 Presence of aortocoronary bypass graft: Secondary | ICD-10-CM | POA: Diagnosis not present

## 2015-11-05 DIAGNOSIS — R040 Epistaxis: Secondary | ICD-10-CM | POA: Diagnosis present

## 2015-11-05 DIAGNOSIS — I252 Old myocardial infarction: Secondary | ICD-10-CM | POA: Diagnosis not present

## 2015-11-05 DIAGNOSIS — I509 Heart failure, unspecified: Secondary | ICD-10-CM | POA: Insufficient documentation

## 2015-11-05 DIAGNOSIS — Z8739 Personal history of other diseases of the musculoskeletal system and connective tissue: Secondary | ICD-10-CM | POA: Diagnosis not present

## 2015-11-05 DIAGNOSIS — Z7901 Long term (current) use of anticoagulants: Secondary | ICD-10-CM | POA: Insufficient documentation

## 2015-11-05 DIAGNOSIS — R51 Headache: Secondary | ICD-10-CM | POA: Diagnosis not present

## 2015-11-05 DIAGNOSIS — F419 Anxiety disorder, unspecified: Secondary | ICD-10-CM | POA: Insufficient documentation

## 2015-11-05 DIAGNOSIS — Z86718 Personal history of other venous thrombosis and embolism: Secondary | ICD-10-CM | POA: Insufficient documentation

## 2015-11-05 DIAGNOSIS — F329 Major depressive disorder, single episode, unspecified: Secondary | ICD-10-CM | POA: Insufficient documentation

## 2015-11-05 DIAGNOSIS — Z79899 Other long term (current) drug therapy: Secondary | ICD-10-CM | POA: Diagnosis not present

## 2015-11-05 DIAGNOSIS — Z8669 Personal history of other diseases of the nervous system and sense organs: Secondary | ICD-10-CM | POA: Diagnosis not present

## 2015-11-05 DIAGNOSIS — Z85038 Personal history of other malignant neoplasm of large intestine: Secondary | ICD-10-CM | POA: Diagnosis not present

## 2015-11-05 DIAGNOSIS — K219 Gastro-esophageal reflux disease without esophagitis: Secondary | ICD-10-CM | POA: Insufficient documentation

## 2015-11-05 DIAGNOSIS — I2510 Atherosclerotic heart disease of native coronary artery without angina pectoris: Secondary | ICD-10-CM | POA: Insufficient documentation

## 2015-11-05 DIAGNOSIS — E785 Hyperlipidemia, unspecified: Secondary | ICD-10-CM | POA: Diagnosis not present

## 2015-11-05 DIAGNOSIS — Z955 Presence of coronary angioplasty implant and graft: Secondary | ICD-10-CM | POA: Diagnosis not present

## 2015-11-05 DIAGNOSIS — Z87891 Personal history of nicotine dependence: Secondary | ICD-10-CM | POA: Insufficient documentation

## 2015-11-05 DIAGNOSIS — E039 Hypothyroidism, unspecified: Secondary | ICD-10-CM | POA: Diagnosis not present

## 2015-11-05 DIAGNOSIS — I10 Essential (primary) hypertension: Secondary | ICD-10-CM | POA: Diagnosis not present

## 2015-11-05 MED ORDER — OXYMETAZOLINE HCL 0.05 % NA SOLN
1.0000 | Freq: Once | NASAL | Status: AC
  Administered 2015-11-05: 1 via NASAL
  Filled 2015-11-05: qty 15

## 2015-11-05 MED ORDER — ACETAMINOPHEN 325 MG PO TABS
650.0000 mg | ORAL_TABLET | Freq: Once | ORAL | Status: AC
  Administered 2015-11-05: 650 mg via ORAL
  Filled 2015-11-05: qty 2

## 2015-11-05 MED ORDER — OXYMETAZOLINE HCL 0.05 % NA SOLN: 1.0000 | Freq: Two times a day (BID) | NASAL | Status: DC

## 2015-11-05 NOTE — Discharge Instructions (Signed)
Medications: Afrin  Treatment: Use the Afrin bottle given today as directed on the bottle for the next 1-2 days. Do not use Afrin for more than 2-3 days in a row, as it can cause increased congestion. Apply Neosporin or bacitracin to the inside of your nostrils daily. Use a humidifier in your room when you sleep at night. Try to avoid putting her fingers in your nose. If your nose begins to bleed again, use pressure, ice, and Afrin to attempt stopping the bleeding.  Follow-up: Please follow-up and establish care with a primary care provider. Please return the emergency Department if he develop any new or worsening symptoms, or your nose begins to bleed again and you cannot get it to stop.   Nosebleed Nosebleeds are common. They are due to a crack in the inside lining of your nose (mucous membrane) or from a small blood vessel that starts to bleed. Nosebleeds can be caused by many conditions, such as injury, infections, dry mucous membranes or dry climate, medicines, nose picking, and home heating and cooling systems. Most nosebleeds come from blood vessels in the front of your nose. HOME CARE INSTRUCTIONS   Try controlling your nosebleed by pinching your nostrils gently and continuously for at least 10 minutes.  Avoid blowing or sniffing your nose for a number of hours after having a nosebleed.  Do not put gauze inside your nose yourself. If your nose was packed by your health care provider, try to maintain the pack inside of your nose until your health care provider removes it.  If a gauze pack was used and it starts to fall out, gently replace it or cut off the end of it.  If a balloon catheter was used to pack your nose, do not cut or remove it unless your health care provider has instructed you to do that.  Avoid lying down while you are having a nosebleed. Sit up and lean forward.  Use a nasal spray decongestant to help with a nosebleed as directed by your health care provider.  Do not  use petroleum jelly or mineral oil in your nose. These can drip into your lungs.  Maintain humidity in your home by using less air conditioning or by using a humidifier.  Aspirinand blood thinners make bleeding more likely. If you are prescribed these medicines and you suffer from nosebleeds, ask your health care provider if you should stop taking the medicines or adjust the dose. Do not stop medicines unless directed by your health care provider  Resume your normal activities as you are able, but avoid straining, lifting, or bending at the waist for several days.  If your nosebleed was caused by dry mucous membranes, use over-the-counter saline nasal spray or gel. This will keep the mucous membranes moist and allow them to heal. If you must use a lubricant, choose the water-soluble variety. Use it only sparingly, and do not use it within several hours of lying down.  Keep all follow-up visits as directed by your health care provider. This is important. SEEK MEDICAL CARE IF:  You have a fever.  You get frequent nosebleeds.  You are getting nosebleeds more often. SEEK IMMEDIATE MEDICAL CARE IF:  Your nosebleed lasts longer than 20 minutes.  Your nosebleed occurs after an injury to your face, and your nose looks crooked or broken.  You have unusual bleeding from other parts of your body.  You have unusual bruising on other parts of your body.  You feel light-headed or you  faint.  You become sweaty.  You vomit blood.  Your nosebleed occurs after a head injury.   This information is not intended to replace advice given to you by your health care provider. Make sure you discuss any questions you have with your health care provider.   Document Released: 04/07/2005 Document Revised: 07/19/2014 Document Reviewed: 02/11/2014 Elsevier Interactive Patient Education [employer].

## 2015-11-05 NOTE — ED Notes (Signed)
Bed: WA10 Expected date:  Expected time:  Means of arrival:  Comments: Hall B 

## 2015-11-05 NOTE — ED Notes (Signed)
Per EMS, Pt, from [facility] independent living, c/o intermittent epistaxis starting around 0430.  Pain score 3/10.  Pt reports awaking up w/ nosebleed.  Denies injury.  Bleeding controlled.  Pt take 81mg  aspirin.  Pt reported to EMS that she was coughing up blood clots earlier.  NAD noted.

## 2015-11-05 NOTE — ED Provider Notes (Signed)
CSN: [id number]     Arrival date & time 11/05/15  [id number] History   First MD Initiated Contact with Patient 11/05/15 [phone]     Chief Complaint  Patient presents with  . Epistaxis     (Consider location/radiation/quality/duration/timing/severity/associated sxs/prior Treatment) HPI Comments: Patient is a 80 year old female with history of multiple MIs, colon cancer, hypertension, DVT who presents today with epistaxis. Patient reports that she woke up around 4 AM feeling like her nose was running. She discovered that was blood and she states that it was flowing quickly. Patient was able to stop the bleeding at this time with pressure and was able to go back to sleep until 7 AM. Patient's bleeding began again, this time passing large clots. Patient states that the bleeding began first in the left and in the right nostril. Patient has had an associated frontal headache that has been constant since around 7 AM. She rates her pain as a 4/10. Patient has been experiencing seasonal allergies lately, and has not taken any medicine for it. Patient takes a baby aspirin daily. Patient was on 0 also until March for DVT prophylaxis. Patient states she has been dealing with some indigestion and constipation for the past few weeks and has been taking an over-the-counter laxative. Otherwise, she has been taking no new medications. Patient had a colonic resection for a cancerous finding in her colon and appendix in October 2016. Patient denies chest pain, shortness of breath, abdominal pain, nausea, vomiting, dysuria, cough. Patient has no history of epistaxis. Patient denies changes in environment. Patient lives independently at [facility].  The history is provided by the patient.    Past Medical History  Diagnosis Date  . Arthritis     Osteoarthrosis  . CAD (coronary artery disease) 1999    CABG  . HLD (hyperlipidemia)   . HTN (hypertension)   . Hypothyroidism   . Colon polyps   . Hiatal hernia   . Peripheral  neuropathy ([place])   . Anxiety   . Hemorrhoids   . Major depressive disorder ([place])   . MI (myocardial infarction) ([place])   . DVT (deep venous thrombosis) ([place])   . CHF (congestive heart failure) ([place])   . GERD (gastroesophageal reflux disease)   . Cancer [facility])     cancer of the appendix   . Confusion, postoperative    Past Surgical History  Procedure Laterality Date  . Carotid stent  07/1998    stent RCA   . Coronary stent placement  10/2001    circumflex stent  . Lumbar laminectomy      fusion 1971  . Thyroid surgery    . Cholecystectomy  2011  . Esophagogastroduodenoscopy    . Colonoscopy w/ biopsies    . Right thumb Right     Thumb Finger Release  . Coronary artery bypass graft  1999    6 vessel per pt.  . Laparoscopic appendectomy N/A 04/11/2015    Procedure: LAPAROSCOPIC APPENDECTOMY;  Surgeon: Coralie Keens, MD;  Location: [place];  Service: General;  Laterality: N/A;  Laparoscopic converted to open appendectomy   . Partial colectomy N/A 04/28/2015    Procedure: Ileocecectomy;  Surgeon: Coralie Keens, MD;  Location: [facility] OR;  Service: General;  Laterality: N/A;   Family History  Problem Relation Age of Onset  . Heart disease Father   . Heart disease Mother   . Heart disease Brother   . Heart disease Other     grandfather (side unknown)  . Colon cancer  Neg Hx   . Cancer Sister     lumphoma   Social History  Substance Use Topics  . Smoking status: Former Smoker -- 1.00 packs/day for 45 years    Quit date: 07/12/1997  . Smokeless tobacco: Never Used     Comment: quit smoking age 42  . Alcohol Use: No   OB History    No data available     Review of Systems  Constitutional: Negative for fever and chills.  HENT: Positive for nosebleeds. Negative for facial swelling and sore throat.   Respiratory: Negative for shortness of breath.   Cardiovascular: Negative for chest pain.  Gastrointestinal: Negative for nausea, vomiting and abdominal pain.  Genitourinary:  Negative for dysuria.  Musculoskeletal: Negative for back pain.  Skin: Negative for rash and wound.  Neurological: Positive for headaches.  Psychiatric/Behavioral: The patient is not nervous/anxious.       Allergies  Rivaroxaban; Niacin; Nitrogen; Nitroglycerin; Myrbetriq; and Niaspan  Home Medications   Prior to Admission medications   Medication Sig Start Date End Date Taking? Authorizing Provider  amLODipine (NORVASC) 10 MG tablet Take 10 mg by mouth daily. 08/25/15  Yes Historical Provider, MD  apixaban (ELIQUIS) 5 MG TABS tablet Take 5 mg by mouth 2 (two) times daily.   Yes Historical Provider, MD  atorvastatin (LIPITOR) 80 MG tablet Take 80 mg by mouth daily.   Yes Historical Provider, MD  docusate sodium (COLACE) 100 MG capsule Take 100 mg by mouth daily.   Yes Historical Provider, MD  levothyroxine (SYNTHROID, LEVOTHROID) 100 MCG tablet Take 100 mcg by mouth daily.     Yes Historical Provider, MD  losartan-hydrochlorothiazide (HYZAAR) 50-12.5 MG tablet Take 1 tablet by mouth daily. 07/15/15  Yes Josue Hector, MD  Melatonin 10 MG TABS Take 10 mg by mouth at bedtime.   Yes Historical Provider, MD  Multiple Vitamin (MULTIVITAMIN) tablet Take 1 tablet by mouth daily.   Yes Historical Provider, MD  omega-3 acid ethyl esters (LOVAZA) 1 g capsule Take 1 g by mouth daily.   Yes Historical Provider, MD  pantoprazole (PROTONIX) 40 MG tablet Take 40 mg by mouth daily. Reported on 11/05/2015 08/25/15  Yes Historical Provider, MD  traZODone (DESYREL) 50 MG tablet Take 50 mg by mouth daily. 05/06/15  Yes Historical Provider, MD  venlafaxine XR (EFFEXOR-XR) 75 MG 24 hr capsule Take 75 mg by mouth daily. 08/28/15  Yes Historical Provider, MD  vitamin B-12 (CYANOCOBALAMIN) 500 MCG tablet Take 1,000 mcg by mouth daily.   Yes Historical Provider, MD  oxymetazoline (AFRIN NASAL SPRAY) 0.05 % nasal spray Place 1 spray into both nostrils 2 (two) times daily. 11/05/15   Kortny Lirette M Ivannia Willhelm, PA-C   BP 131/88  mmHg  Pulse 102  Temp(Src) 97.6 F (36.4 C) (Oral)  Resp 16  Ht 5\' 5"  (1.651 m)  Wt 81.449 kg  BMI 29.88 kg/m2  SpO2 97% Physical Exam  Constitutional: She appears well-developed and well-nourished. No distress.  Moderate amount of blood down the front of patient's night gown  HENT:  Head: Normocephalic and atraumatic.  Mouth/Throat: Oropharynx is clear and moist. No oropharyngeal exudate.  No blood visualized in either nostril with otoscope, mucosa erythematous  Eyes: Conjunctivae are normal. Pupils are equal, round, and reactive to light. Right eye exhibits no discharge. Left eye exhibits no discharge. No scleral icterus.  Neck: Normal range of motion. Neck supple. No thyromegaly present.  Cardiovascular: Normal rate, regular rhythm, normal heart sounds and intact distal pulses.  Exam reveals no gallop and no friction rub.   No murmur heard. Pulmonary/Chest: Effort normal and breath sounds normal. No stridor. No respiratory distress. She has no wheezes. She has no rales.  Abdominal: Soft. Bowel sounds are normal. She exhibits no distension. There is no tenderness. There is no rebound and no guarding.  Musculoskeletal: She exhibits no edema.  Lymphadenopathy:    She has no cervical adenopathy.  Neurological: She is alert. Coordination normal.  Skin: Skin is warm and dry. No rash noted. She is not diaphoretic. No pallor.  Psychiatric: She has a normal mood and affect.  Nursing note and vitals reviewed.   ED Course  Procedures (including critical care time) Labs Review Labs Reviewed - No data to display  Imaging Review No results found. I have personally reviewed and evaluated these images and lab results as part of my medical decision-making.   EKG Interpretation None      MDM   Afrin given and ice applied to nose with good relief. Tylenol given for HA with good relief. Patient's nose bleeding never returned throughout ED course. Discussed with patient and supportive  care and suggested a humidifier for her room. She states she has any new place for the past week, with new carpet that smells. This may be drying out her nose. Patient encouraged to establish care with a primary care physician and follow up with states visit. Patient advised to use Afrin for 1-2 days, apply Neosporin to her nars. Patient discharged with stable vitals and in satisfactory condition. Patient also evaluated by Dr. Lacinda Axon who is in agreement with plan.  Final diagnoses:  Epistaxis       Frederica Kuster, PA-C 11/05/15 [place], MD 11/06/15 [phone]

## 2015-11-06 ENCOUNTER — Encounter: Payer: Self-pay | Admitting: Gastroenterology

## 2015-11-18 NOTE — Progress Notes (Signed)
Patient ID: Rachel Conner, female   DOB: [birth year], 80 y.o.   MRN: [id number]   Rachel Conner is seen today in followup for history of coronary artery disease and bypass surgery. I believe her bypass was in 99. He subsequently had a stenting of the native RCA she has an occluded vein graft to the right. She also has a stent in the native obtuse marginal branch. Her last catheter was in 10-24-2001.  Myoview in Oct 25, 2006 was nonischemic.her ejection fraction 64% at that time. She is not having t any significant chest pain. She has had a persistant dry cough with lisinipril Changed to cozaar in February She continues to need to work on her diet and carb intake. He BS has been ok as has her cholesterol as checked by her primary She had succesful gallbladder surgery in March. She had her F/U colonoscopy and there were no issues. In February has extensive LLE DVT F/U duplex in August showed mild residual thrombus in left external iliac. Compliant with coumadin Being checked in [place] and has been too low Repeat duplex here today still shows large clot burden in left femoral and iliac with limited phasicity. Anticoagulation changed to xarelto 10/24/13   Having more memory problems Seen at [facility] recently ? Vascular dementia HR low and lopressor stopped Husband died in 2014-10-25  BP improved adding ACE and calcium blocker   Hurt right knee meniscal tear and may need surgery  Some SSCP pre op myovue done 09/11/14  Reviewed No ischemia or infarct EF 66%   01/15/15  Carotids reviewed plaque no stenosis ordered by eye doctor  Doing much better On effexor Living at [place] and away from daughter who was not a good influence   ROS: Denies fever, malais, weight loss, blurry vision, decreased visual acuity, cough, sputum, SOB, hemoptysis, pleuritic pain, palpitaitons, heartburn, abdominal pain, melena, lower extremity edema, claudication, or rash.  All other systems reviewed and negative  General: Affect appropriate Healthy:   appears stated age 80: normal Neck supple with no adenopathy JVP normal no bruits no thyromegaly Lungs clear with no wheezing and good diaphragmatic motion Heart:  S1/S2 no murmur, no rub, gallop or click PMI normal Abdomen: benighn, BS positve, no tenderness, no AAA no bruit.  No HSM or HJR Distal pulses intact with no bruits No edema Neuro non-focal Skin warm and dry No muscular weakness   Current Outpatient Prescriptions  Medication Sig Dispense Refill  . ALPRAZolam (XANAX) 0.25 MG tablet Take 0.25 mg by mouth at bedtime as needed for anxiety.    Marland Kitchen amLODipine (NORVASC) 10 MG tablet Take 1 tablet (10 mg total) by mouth daily. 90 tablet 3  . atorvastatin (LIPITOR) 80 MG tablet Take 1 tablet (80 mg total) by mouth daily. 90 tablet 3  . docusate sodium (COLACE) 100 MG capsule Take 100 mg by mouth daily.    Marland Kitchen levothyroxine (SYNTHROID, LEVOTHROID) 100 MCG tablet Take 100 mcg by mouth daily.      Marland Kitchen loratadine (CLARITIN) 10 MG tablet Take 10 mg by mouth daily.    Marland Kitchen losartan-hydrochlorothiazide (HYZAAR) 50-12.5 MG tablet Take 1 tablet by mouth daily. 90 tablet 2  . Melatonin 10 MG CAPS Take 1 tablet by mouth at bedtime.    . Melatonin 10 MG TABS Take 10 mg by mouth at bedtime.    . Multiple Vitamin (MULTIVITAMIN) tablet Take 1 tablet by mouth daily.    Marland Kitchen omega-3 acid ethyl esters (LOVAZA) 1 g capsule Take 1 g by  mouth daily.    Marland Kitchen oxymetazoline (AFRIN NASAL SPRAY) 0.05 % nasal spray Place 1 spray into both nostrils 2 (two) times daily. 30 mL 0  . pantoprazole (PROTONIX) 40 MG tablet Take 40 mg by mouth daily. Reported on 11/05/2015    . rivaroxaban (XARELTO) 15 MG TABS tablet Take 1 tablet (15 mg total) by mouth daily with supper. 90 tablet 3  . traZODone (DESYREL) 50 MG tablet Take 1 tablet (50 mg total) by mouth daily. 90 tablet 3  . venlafaxine XR (EFFEXOR-XR) 75 MG 24 hr capsule Take 75 mg by mouth daily.    . vitamin B-12 (CYANOCOBALAMIN) 500 MCG tablet Take 1,000 mcg by mouth  daily.     No current facility-administered medications for this visit.    Allergies  Rivaroxaban; Niacin; Nitrogen; Nitroglycerin; Myrbetriq; and Niaspan  Electrocardiogram:  SR rate 55 nonspecific ST changes  02/12/14    09/11/14  SR rate 82  Nonspecific ST changes   Assessment and Plan CAD:  Stable with no angina and good activity level.  Continue medical Rx HTN: Well controlled.  Continue current medications and low sodium Dash type diet.   Chol:  Labs from [place] reviewed and LDL 78 normal LFTls Cholesterol is at goal.  Continue current dose of statin and diet Rx.  No myalgias or side effects.  F/U  LFT's in 6 months. Lab Results  Component Value Date   LDLCALC 81 11/07/2013  Depression: major issue not taking Celexa  Now on effexor and trazodone   F/u with me in 6 months          Rachel Conner

## 2015-11-20 ENCOUNTER — Ambulatory Visit (INDEPENDENT_AMBULATORY_CARE_PROVIDER_SITE_OTHER): Payer: Medicare Other | Admitting: Cardiovascular Disease

## 2015-11-20 ENCOUNTER — Encounter: Payer: Self-pay | Admitting: Cardiovascular Disease

## 2015-11-20 VITALS — BP 128/62 | HR 90 | Ht 65.0 in | Wt 182.0 lb

## 2015-11-20 DIAGNOSIS — I2583 Coronary atherosclerosis due to lipid rich plaque: Principal | ICD-10-CM

## 2015-11-20 DIAGNOSIS — I2510 Atherosclerotic heart disease of native coronary artery without angina pectoris: Secondary | ICD-10-CM

## 2015-11-20 MED ORDER — AMLODIPINE BESYLATE 10 MG PO TABS: 10.0000 mg | ORAL_TABLET | Freq: Every day | ORAL | Status: DC

## 2015-11-20 MED ORDER — TRAZODONE HCL 50 MG PO TABS: 50.0000 mg | ORAL_TABLET | Freq: Every day | ORAL | Status: DC

## 2015-11-20 MED ORDER — RIVAROXABAN 15 MG PO TABS: 15.0000 mg | ORAL_TABLET | Freq: Every day | ORAL | Status: DC

## 2015-11-20 MED ORDER — ATORVASTATIN CALCIUM 80 MG PO TABS: 80.0000 mg | ORAL_TABLET | Freq: Every day | ORAL | Status: DC

## 2015-11-20 NOTE — Patient Instructions (Addendum)
Medication Instructions:  Your physician has recommended you make the following change in your medication:  1-Decrease xarelto 15 mg by mouth daily . Labwork: NONE  Testing/Procedures: NONE  Follow-Up: Your physician wants you to follow-up in: 6 months with Dr. Johnsie Cancel. You will receive a reminder letter in the mail two months in advance. If you don't receive a letter, please call our office to schedule the follow-up appointment.   If you need a refill on your cardiac medications before your next appointment, please call your pharmacy.

## 2015-11-24 ENCOUNTER — Ambulatory Visit: Payer: Medicare Other | Admitting: Family Medicine

## 2015-12-09 ENCOUNTER — Telehealth: Payer: Self-pay | Admitting: *Deleted

## 2015-12-09 NOTE — Telephone Encounter (Signed)
Voicemail "requesting instructions on how to collect 24-hour urine collection.  Do we save or throw out the first urine?  Call [phone] and leave message on voicemail."  Called receiving voicemail with instructions to discard the first morning urine writing down the start time.  Save each void including the last of the 24 hour collection time.  Keep jug of urine on ice through travel time and drop off at [facility].  Make sure drop off is before 4:00pm.

## 2015-12-11 ENCOUNTER — Ambulatory Visit (HOSPITAL_COMMUNITY)
Admission: RE | Admit: 2015-12-11 | Discharge: 2015-12-11 | Disposition: A | Discharge status: Home / Self Care | Payer: Medicare Other | Source: Ambulatory Visit | Attending: Hematology | Admitting: Hematology

## 2015-12-11 ENCOUNTER — Other Ambulatory Visit (HOSPITAL_BASED_OUTPATIENT_CLINIC_OR_DEPARTMENT_OTHER): Payer: Medicare Other

## 2015-12-11 ENCOUNTER — Encounter (HOSPITAL_COMMUNITY): Payer: Self-pay

## 2015-12-11 DIAGNOSIS — Z9049 Acquired absence of other specified parts of digestive tract: Secondary | ICD-10-CM | POA: Diagnosis not present

## 2015-12-11 DIAGNOSIS — C181 Malignant neoplasm of appendix: Secondary | ICD-10-CM

## 2015-12-11 DIAGNOSIS — I2510 Atherosclerotic heart disease of native coronary artery without angina pectoris: Secondary | ICD-10-CM | POA: Insufficient documentation

## 2015-12-11 DIAGNOSIS — C189 Malignant neoplasm of colon, unspecified: Secondary | ICD-10-CM | POA: Insufficient documentation

## 2015-12-11 DIAGNOSIS — Z951 Presence of aortocoronary bypass graft: Secondary | ICD-10-CM | POA: Insufficient documentation

## 2015-12-11 DIAGNOSIS — J439 Emphysema, unspecified: Secondary | ICD-10-CM | POA: Insufficient documentation

## 2015-12-11 LAB — COMPREHENSIVE METABOLIC PANEL
ALBUMIN: 3.5 g/dL (ref 3.5–5.0)
ALK PHOS: 55 U/L (ref 40–150)
ALT: 10 U/L (ref 0–55)
AST: 13 U/L (ref 5–34)
Anion Gap: 7 mEq/L (ref 3–11)
BUN: 17.6 mg/dL (ref 7.0–26.0)
CHLORIDE: 103 meq/L (ref 98–109)
CO2: 29 meq/L (ref 22–29)
Calcium: 9.4 mg/dL (ref 8.4–10.4)
Creatinine: 0.8 mg/dL (ref 0.6–1.1)
EGFR: 72 mL/min/{1.73_m2} — AB (ref >90)
GLUCOSE: 100 mg/dL (ref 70–140)
POTASSIUM: 3.9 meq/L (ref 3.5–5.1)
SODIUM: 139 meq/L (ref 136–145)
Total Bilirubin: 0.4 mg/dL (ref 0.20–1.20)
Total Protein: 6.9 g/dL (ref 6.4–8.3)

## 2015-12-11 LAB — CBC WITH DIFFERENTIAL/PLATELET
BASO%: 0.3 % (ref 0.0–2.0)
BASOS ABS: 0 10*3/uL (ref 0.0–0.1)
EOS%: 4.1 % (ref 0.0–7.0)
Eosinophils Absolute: 0.4 10*3/uL (ref 0.0–0.5)
HCT: 34.3 % — ABNORMAL LOW (ref 34.8–46.6)
HGB: 11.1 g/dL — ABNORMAL LOW (ref 11.6–15.9)
LYMPH%: 25.5 % (ref 14.0–49.7)
MCH: 28.2 pg (ref 25.1–34.0)
MCHC: 32.4 g/dL (ref 31.5–36.0)
MCV: 87.1 fL (ref 79.5–101.0)
MONO#: 0.9 10*3/uL (ref 0.1–0.9)
MONO%: 9.6 % (ref 0.0–14.0)
NEUT#: 5.8 10*3/uL (ref 1.5–6.5)
NEUT%: 60.5 % (ref 38.4–76.8)
Platelets: ADEQUATE 10*3/uL (ref 145–400)
RBC: 3.94 10*6/uL (ref 3.70–5.45)
RDW: 14.6 % — ABNORMAL HIGH (ref 11.2–14.5)
WBC: 9.7 10*3/uL (ref 3.9–10.3)
lymph#: 2.5 10*3/uL (ref 0.9–3.3)
nRBC: 0 % (ref 0–0)

## 2015-12-11 MED ORDER — IOPAMIDOL (ISOVUE-300) INJECTION 61%
100.0000 mL | Freq: Once | INTRAVENOUS | Status: AC | PRN
  Administered 2015-12-11: 100 mL via INTRAVENOUS

## 2015-12-12 LAB — CEA: CEA: 4.1 ng/mL (ref 0.0–4.7)

## 2015-12-14 LAB — 5 HIAA, QUANTITATIVE, URINE, 24 HOUR
5-HIAA, URINE: 2.5 mg/L
5-HIAA,Quant.,24 Hr Urine: 4.6 mg/24 hr (ref 0.0–14.9)

## 2015-12-15 LAB — CHROMOGRANIN A: CHROMOGRAN A: 26 nmol/L — AB (ref 0–5)

## 2015-12-16 LAB — CHROMOGRANIN A (PARALLEL TESTING): CHROMOGRANIN A: 140 ng/mL — AB (ref <=15)

## 2015-12-16 LAB — CEA (PARALLEL TESTING): CEA: 3 ng/mL — AB

## 2015-12-17 ENCOUNTER — Telehealth: Payer: Self-pay | Admitting: Hematology

## 2015-12-17 ENCOUNTER — Encounter: Payer: Self-pay | Admitting: Hematology

## 2015-12-17 ENCOUNTER — Ambulatory Visit (HOSPITAL_BASED_OUTPATIENT_CLINIC_OR_DEPARTMENT_OTHER): Payer: Medicare Other | Admitting: Hematology

## 2015-12-17 VITALS — BP 98/48 | HR 94 | Temp 98.1°F | Resp 18 | Ht 65.0 in | Wt 187.1 lb

## 2015-12-17 DIAGNOSIS — R110 Nausea: Secondary | ICD-10-CM | POA: Diagnosis not present

## 2015-12-17 DIAGNOSIS — C181 Malignant neoplasm of appendix: Secondary | ICD-10-CM | POA: Diagnosis present

## 2015-12-17 DIAGNOSIS — D649 Anemia, unspecified: Secondary | ICD-10-CM

## 2015-12-17 DIAGNOSIS — K5900 Constipation, unspecified: Secondary | ICD-10-CM | POA: Diagnosis not present

## 2015-12-17 DIAGNOSIS — D500 Iron deficiency anemia secondary to blood loss (chronic): Secondary | ICD-10-CM

## 2015-12-17 DIAGNOSIS — E039 Hypothyroidism, unspecified: Secondary | ICD-10-CM | POA: Diagnosis not present

## 2015-12-17 DIAGNOSIS — I509 Heart failure, unspecified: Secondary | ICD-10-CM

## 2015-12-17 DIAGNOSIS — I10 Essential (primary) hypertension: Secondary | ICD-10-CM | POA: Diagnosis not present

## 2015-12-17 MED ORDER — ONDANSETRON HCL 8 MG PO TABS: 8.0000 mg | ORAL_TABLET | Freq: Three times a day (TID) | ORAL | Status: DC | PRN

## 2015-12-17 NOTE — Telephone Encounter (Signed)
Gave pt apt & avs °

## 2015-12-17 NOTE — Progress Notes (Signed)
[facility]  [phone] [phone]) [phone]  Clinic Follow Up Note   Patient Care Team: Bernerd Limbo, MD as PCP - General (Family Medicine) Coralie Keens, MD as Consulting Physician (General Surgery) Gatha Mayer, MD as Consulting Physician (Gastroenterology) Josue Hector, MD as Consulting Physician (Cardiology) Tania Ade, RN as Registered Nurse 12/17/2015   CHIEF COMPLAINTS:  Follow up mixed carcinoids-adenocarcinoma of appendix  HISTORY OF PRESENTING ILLNESS (05/29/2015):  Rachel Conner 80 y.o. 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 She is accompanied by her son to the clinic today. She came in a wheelchair.  She presented with abdominal pain, intermittent nausea and vomiting, diarrhea, for a few months. She was seen at [facility] emergency room in September, and CT of abdomen and pelvis revealed dilated appendix. She was referred to general surgeon Dr. Belenda Cruise, who brought her to OR on 04/11/2015 for appendectomy. She tolerated surgery well. The surgical path reviewed mixed carcinoid and adenocarcinoma, with area of small perforation. She was brought back to the OR on 04/28/2015, and underwent right hemicolectomy.  She has been recovering well from her surgery. She still feels quite fatigued, she is able to do most ADLs, but not much other activities. She has physical therapist and occupational therapist coming to her house twice a week. Her appetite has improved, she is eating better. Her pain incision has been much improved.  CURRENT THERAPY: Observation  INTERIM HISTORY Rachel Conner returns for follow-up. She is accompanied by her daughter-in-law to the clinic today. She is doing moderately well. She complains of indigestion, n intermittent nausea, no vomiting. She takes toms as needed. She also has constipation, uses Colace, and reports intermittent rectal bleeding. No significant abdominal  pain, does feel slightly bloated. She has low energy level and appetite, has lost some weight lately.   Intermittent nausea MEDICAL HISTORY:  Past Medical History  Diagnosis Date  . Arthritis     Osteoarthrosis  . CAD (coronary artery disease) 1999    CABG  . HLD (hyperlipidemia)   . HTN (hypertension)   . Hypothyroidism   . Colon polyps   . Hiatal hernia   . Peripheral neuropathy ([place])   . Anxiety   . Hemorrhoids   . Major depressive disorder ([place])   . MI (myocardial infarction) ([place])   . DVT (deep venous thrombosis) ([place])   . CHF (congestive heart failure) ([place])   . GERD (gastroesophageal reflux disease)   . Cancer [facility])     cancer of the appendix   . Confusion, postoperative     SURGICAL HISTORY: Past Surgical History  Procedure Laterality Date  . Carotid stent  07/1998    stent RCA   . Coronary stent placement  10/2001    circumflex stent  . Lumbar laminectomy      fusion 1971  . Thyroid surgery    . Cholecystectomy  2011  . Esophagogastroduodenoscopy    . Colonoscopy w/ biopsies    . Right thumb Right     Thumb Finger Release  . Coronary artery bypass graft  1999    6 vessel per pt.  . Laparoscopic appendectomy N/A 04/11/2015    Procedure: LAPAROSCOPIC APPENDECTOMY;  Surgeon: Coralie Keens, MD;  Location: [place];  Service: General;  Laterality: N/A;  Laparoscopic converted to open appendectomy   . Partial colectomy N/A 04/28/2015    Procedure: Ileocecectomy;  Surgeon: Coralie Keens, MD;  Location: [place];  Service: General;  Laterality: N/A;  SOCIAL HISTORY: Social History   Social History  . Marital Status: Married    Spouse Name: N/A  . Number of Children: 3  . Years of Education: N/A   Occupational History  . retired    Social History Main Topics  . Smoking status: Former Smoker -- 1.00 packs/day for 45 years    Quit date: 07/12/1997  . Smokeless tobacco: Never Used     Comment: quit smoking age 80  . Alcohol Use: No  . Drug Use: No  .  Sexual Activity: Not on file   Other Topics Concern  . Not on file   Social History Narrative   Widowed, lived [place] and his wife will stay with her after release from facility   Retired from being EMT/med tech   Originally from [place]   Able to perform ADL's independently    FAMILY HISTORY: Family History  Problem Relation Age of Onset  . Heart disease Father   . Heart disease Mother   . Heart disease Brother   . Heart disease Other     grandfather (side unknown)  . Colon cancer Neg Hx   . Cancer Sister     lumphoma    ALLERGIES:  is allergic to rivaroxaban; niacin; nitrogen; nitroglycerin; myrbetriq; and niaspan.  MEDICATIONS:  Current Outpatient Prescriptions  Medication Sig Dispense Refill  . ALPRAZolam (XANAX) 0.25 MG tablet Take 0.25 mg by mouth at bedtime as needed for anxiety.    Marland Kitchen amLODipine (NORVASC) 10 MG tablet Take 1 tablet (10 mg total) by mouth daily. 90 tablet 3  . atorvastatin (LIPITOR) 80 MG tablet Take 1 tablet (80 mg total) by mouth daily. 90 tablet 3  . docusate sodium (COLACE) 100 MG capsule Take 100 mg by mouth daily.    Marland Kitchen esomeprazole (NEXIUM) 40 MG capsule Take 40 mg by mouth.    . fluticasone (FLONASE) 50 MCG/ACT nasal spray     . levothyroxine (SYNTHROID, LEVOTHROID) 100 MCG tablet Take 100 mcg by mouth daily.      Marland Kitchen loratadine (CLARITIN) 10 MG tablet Take 10 mg by mouth daily.    Marland Kitchen losartan-hydrochlorothiazide (HYZAAR) 50-12.5 MG tablet Take 1 tablet by mouth daily. 90 tablet 2  . Melatonin 10 MG CAPS Take 1 tablet by mouth at bedtime.    . Multiple Vitamin (MULTIVITAMIN) tablet Take 1 tablet by mouth daily.    . rivaroxaban (XARELTO) 15 MG TABS tablet Take 1 tablet (15 mg total) by mouth daily with supper. 90 tablet 3  . traZODone (DESYREL) 50 MG tablet Take 1 tablet (50 mg total) by mouth daily. 90 tablet 3  . venlafaxine XR (EFFEXOR-XR) 75 MG 24 hr capsule Take 75 mg by mouth daily.    . vitamin B-12 (CYANOCOBALAMIN) 500 MCG tablet Take  1,000 mcg by mouth daily.    Marland Kitchen aspirin EC 81 MG tablet Take 81 mg by mouth.    . omega-3 acid ethyl esters (LOVAZA) 1 g capsule Take 1 g by mouth daily.    Marland Kitchen oxymetazoline (AFRIN NASAL SPRAY) 0.05 % nasal spray Place 1 spray into both nostrils 2 (two) times daily. 30 mL 0   No current facility-administered medications for this visit.    REVIEW OF SYSTEMS:   Constitutional: Denies fevers, chills or abnormal night sweats Eyes: Denies blurriness of vision, double vision or watery eyes Ears, nose, mouth, throat, and face: Denies mucositis or sore throat Respiratory: Denies cough, dyspnea or wheezes Cardiovascular: Denies palpitation, chest discomfort or lower extremity swelling  Gastrointestinal:  Denies nausea, heartburn or change in bowel habits Skin: Denies abnormal skin rashes Lymphatics: Denies new lymphadenopathy or easy bruising Neurological:Denies numbness, tingling or new weaknesses Behavioral/Psych: Mood is stable, no new changes  All other systems were reviewed with the patient and are negative.  PHYSICAL EXAMINATION: ECOG PERFORMANCE STATUS: 1  Filed Vitals:   12/17/15 1112  BP: 98/48  Pulse: 94  Temp: 98.1 F (36.7 C)  Resp: 18   Filed Weights   12/17/15 1112  Weight: 187 lb 1.6 oz (84.868 kg)    GENERAL:alert, no distress and comfortable SKIN: skin color, texture, turgor are normal, no rashes or significant lesions EYES: normal, conjunctiva are pink and non-injected, sclera clear OROPHARYNX:no exudate, no erythema and lips, buccal mucosa, and tongue normal  NECK: supple, thyroid normal size, non-tender, without nodularity LYMPH:  no palpable lymphadenopathy in the cervical, axillary or inguinal LUNGS: clear to auscultation and percussion with normal breathing effort HEART: regular rate & rhythm and no murmurs and no lower extremity edema ABDOMEN:abdomen soft, non-tender and normal bowel sounds. (+) surgical incision sites are healing well. Musculoskeletal:no  cyanosis of digits and no clubbing  PSYCH: alert & oriented x 3 with fluent speech NEURO: no focal motor/sensory deficits  LABORATORY DATA:  I have reviewed the data as listed CBC Latest Ref Rng 12/11/2015 09/17/2015 06/12/2015  WBC 3.9 - 10.3 10e3/uL 9.7 9.8 8.8  Hemoglobin 11.6 - 15.9 g/dL 11.1(L) 13.7 9.8(L)  Hematocrit 34.8 - 46.6 % 34.3(L) 42.7 31.7(L)  Platelets 145 - 400 10e3/uL Clumped Platelets--Appears Adequate 303 382    CMP Latest Ref Rng 12/11/2015 09/17/2015 06/12/2015  Glucose 70 - 140 mg/dl 100 101 132  BUN 7.0 - 26.0 mg/dL 17.6 12.5 10.0  Creatinine 0.6 - 1.1 mg/dL 0.8 0.8 0.9  Sodium 136 - 145 mEq/L 139 142 137  Potassium 3.5 - 5.1 mEq/L 3.9 3.7 3.5  Chloride 101 - 111 mmol/L - - -  CO2 22 - 29 mEq/L 29 28 25   Calcium 8.4 - 10.4 mg/dL 9.4 9.8 9.3  Total Protein 6.4 - 8.3 g/dL 6.9 7.1 6.5  Total Bilirubin 0.20 - 1.20 mg/dL 0.40 0.42 0.56  Alkaline Phos 40 - 150 U/L 55 50 44  AST 5 - 34 U/L 13 17 18   ALT 0 - 55 U/L 10 13 14      Results for Rachel Conner, Rachel Conner (MRN [id number]) as of 12/17/2015 07:19  Ref. Range 09/17/2015 08:31 12/11/2015 11:05  CEA Latest Units: ng/mL 2.3 3.0 (H)  CEA Latest Ref Range: 0.0-4.7 ng/mL 3.6 4.1  Chromogranin A Latest Ref Range: <=15 ng/mL 50 (H) 140 (H)   Chromogranin A  Status: Finalresult Visible to patient:  Not Released Nextappt: 04/16/2016 at 12:00 PM in Oncology ([facility] ONLY) Dx:  Cancer of appendix ([place])              Ref Range 6d ago  37mo ago     Chromogranin A 0 - 5 nmol/L 26 (H) 6 (H)CM         Results for Rachel Conner, Rachel Conner (MRN [id number]) as of 12/17/2015 07:19  Ref. Range 09/17/2015 08:26 12/11/2015 12:10  5-HIAA,Quant.,24 Hr Urine Latest Ref Range: 0.0-14.9 mg/24 hr 3.0 4.6  5-HIAA, Urine Latest Ref Range: Undefined mg/L 1.3 2.5     PATHOLOGY REPORT  Diagnosis 04/28/2015 Colon, segmental resection for tumor, Distal illium and cecum - MIXED GOBLET CELL CARCINOID/CARCINOMA-ADENOCARCINOMA, SEE  COMMENT. - TUMOR INVOLVES APPENDICEAL OS. - TUMOR INVOLVES FIBROFATTY SOFT TISSUE. AROUND THE APPENDICEAL  OS - ONE LYMPH NODE, POSITIVE FOR METASTATIC TUMOR (1/5). - SURGICAL MARGINS, NEGATIVE FOR TUMOR. Microscopic Comment The previous appendectomy demonstrating mixed goblet cell carcinoid/carcinoma-adenocarcinoma is noted [id number]). Please see previous case for additional tumor information. (CRR:ecj 04/29/2015)  Diagnosis 04/11/2015 Appendix, Other than Incidental - MIXED GOBLET CELL CARCINOID-ADENOCARCINOMA, GRADE 3, SPANNING 4.5 CM. - TUMOR INVADES THROUGH SEROSA. - PERINEURAL INVASION PRESENT. - RESECTION MARGIN IS POSITIVE. - SEE ONCOLOGY TABLE. Microscopic Comment APPENDIX: Specimen: Appendix. Procedure: Appendectomy. Specimen Integrity: Disrupted.  Specimen Size: 4.5 x 3.0 x 1.9 cm. Tumor Site: Diffuse involving appendix. Tumor Size: Approximately 4.5 cm. Histologic Type: Mixed goblet cell carcinoma-adenocarcinoma. Histologic Grade: Grade 3 (poorly differentiated) Microscopic Tumor Extension: Through serosa. Margins: Proximal Margin: Involved. Mesenteric Margin: N/A. Adenoma present at proximal margin: No. Lymph-Vascular Invasion: Not identified. Perineural Invasion: Present. Peritumoral Nodules (tumor deposits): Not identified. Lymph nodes: number examined 0; number positive: 0 TNM: pT4, pNX Ancillary studies: Can be performed upon request. Comments: The tumor consists of infiltrating nests and single goblet cells. Additionally there are areas of conventional gland forming adenocarcinoma. Immunohistochemistry reveals the goblet cells are positive for synaptophysin, chromogranin, and CD56. The tumor diffusely involves the appendix from the tip to the resection margin and is thus estimated at 4.5 cm in size. There is an area of possible perforation, although this is difficult to assess with the disrupted nature of the specimen. Tumor invades through the serosa. Dr.  Donato Heinz has reviewed the case. The case was called to Dr. Ninfa Linden on 04/15/2015.    RADIOGRAPHIC STUDIES: I have personally reviewed the radiological images as listed and agreed with the findings in the report. Ct Chest W Contrast  12/11/2015  CLINICAL DATA:  80 year old female status post appendectomy 04/11/2015 revealing mixed carcinoid -adenocarcinoma of the appendix, with positive surgical margins, status post ileocecal resection 04/28/2015. Patient presents for surveillance. Patient reports fatigue and nausea. EXAM: CT CHEST, ABDOMEN, AND PELVIS WITH CONTRAST TECHNIQUE: Multidetector CT imaging of the chest, abdomen and pelvis was performed following the standard protocol during bolus administration of intravenous contrast. CONTRAST:  175mL ISOVUE-300 IOPAMIDOL (ISOVUE-300) INJECTION 61% COMPARISON:  06/12/2015 chest CT.  05/09/2015 CT abdomen/pelvis. FINDINGS: CT CHEST Mediastinum/Nodes: Normal heart size. No pericardial fluid/thickening. Left anterior descending, left circumflex and right coronary atherosclerosis status post CABG with left internal mammary and ascending aortic bypass grafts. Atherosclerotic nonaneurysmal thoracic aorta. Normal caliber pulmonary arteries. No central pulmonary emboli. Apparent total thyroidectomy. Normal esophagus. No pathologically enlarged axillary, mediastinal or hilar lymph nodes. Stable coarsely calcified subcentimeter AP window and left hilar lymph nodes from prior granulomatous disease. Lungs/Pleura: No pneumothorax. No pleural effusion. Mild centrilobular emphysema. Subcentimeter calcified left upper lobe granuloma. No acute consolidative airspace disease, new significant pulmonary nodules or lung masses. Musculoskeletal: No aggressive appearing focal osseous lesions. Moderate thoracic spine spondylosis. Sternotomy wires appear aligned and intact. CT ABDOMEN AND PELVIS Hepatobiliary: Normal liver with no liver mass. Cholecystectomy. No biliary ductal dilatation.  Pancreas: Normal, with no mass or duct dilation. Spleen: Normal size. No mass. Adrenals/Urinary Tract: Normal adrenals. No hydronephrosis. Stable mildly dilated right extrarenal pelvis. No renal mass. Normal bladder. Stomach/Bowel: Grossly normal stomach. Status post ileocecal resection with neo ileocolic anastomosis in the right abdomen. No anastomotic mass or wall thickening. No dilated small bowel loops or small bowel wall thickening. Oral contrast progresses to the splenic flexure of the colon. No large bowel wall thickening or pericolonic fat stranding. Moderate stool throughout the colon. No appreciable peritoneal nodularity. Vascular/Lymphatic: Atherosclerotic nonaneurysmal abdominal aorta. Patent portal, splenic  and renal veins. The left common and left external iliac veins appear diminutive and there are prominent superficial venous collaterals draining the left common femoral vein, unchanged. No pathologically enlarged lymph nodes in the abdomen or pelvis. Reproductive: Grossly normal uterus.  No adnexal mass. Other: No pneumoperitoneum, ascites or focal fluid collection. Musculoskeletal: No aggressive appearing focal osseous lesions. Marked degenerative changes in the lumbar spine. IMPRESSION: 1. Status post ileocecal resection. No evidence of local tumor recurrence in the right abdomen. 2. No evidence of metastatic disease in the chest, abdomen or pelvis. 3. Additional findings include mild emphysema, three-vessel coronary atherosclerosis status post CABG and probable chronic left iliac vein stenosis. Electronically Signed   By: Ilona Sorrel M.D.   On: 12/11/2015 14:18   Ct Abdomen Pelvis W Contrast  12/11/2015  CLINICAL DATA:  80 year old female status post appendectomy 04/11/2015 revealing mixed carcinoid -adenocarcinoma of the appendix, with positive surgical margins, status post ileocecal resection 04/28/2015. Patient presents for surveillance. Patient reports fatigue and nausea. EXAM: CT CHEST,  ABDOMEN, AND PELVIS WITH CONTRAST TECHNIQUE: Multidetector CT imaging of the chest, abdomen and pelvis was performed following the standard protocol during bolus administration of intravenous contrast. CONTRAST:  134mL ISOVUE-300 IOPAMIDOL (ISOVUE-300) INJECTION 61% COMPARISON:  06/12/2015 chest CT.  05/09/2015 CT abdomen/pelvis. FINDINGS: CT CHEST Mediastinum/Nodes: Normal heart size. No pericardial fluid/thickening. Left anterior descending, left circumflex and right coronary atherosclerosis status post CABG with left internal mammary and ascending aortic bypass grafts. Atherosclerotic nonaneurysmal thoracic aorta. Normal caliber pulmonary arteries. No central pulmonary emboli. Apparent total thyroidectomy. Normal esophagus. No pathologically enlarged axillary, mediastinal or hilar lymph nodes. Stable coarsely calcified subcentimeter AP window and left hilar lymph nodes from prior granulomatous disease. Lungs/Pleura: No pneumothorax. No pleural effusion. Mild centrilobular emphysema. Subcentimeter calcified left upper lobe granuloma. No acute consolidative airspace disease, new significant pulmonary nodules or lung masses. Musculoskeletal: No aggressive appearing focal osseous lesions. Moderate thoracic spine spondylosis. Sternotomy wires appear aligned and intact. CT ABDOMEN AND PELVIS Hepatobiliary: Normal liver with no liver mass. Cholecystectomy. No biliary ductal dilatation. Pancreas: Normal, with no mass or duct dilation. Spleen: Normal size. No mass. Adrenals/Urinary Tract: Normal adrenals. No hydronephrosis. Stable mildly dilated right extrarenal pelvis. No renal mass. Normal bladder. Stomach/Bowel: Grossly normal stomach. Status post ileocecal resection with neo ileocolic anastomosis in the right abdomen. No anastomotic mass or wall thickening. No dilated small bowel loops or small bowel wall thickening. Oral contrast progresses to the splenic flexure of the colon. No large bowel wall thickening or  pericolonic fat stranding. Moderate stool throughout the colon. No appreciable peritoneal nodularity. Vascular/Lymphatic: Atherosclerotic nonaneurysmal abdominal aorta. Patent portal, splenic and renal veins. The left common and left external iliac veins appear diminutive and there are prominent superficial venous collaterals draining the left common femoral vein, unchanged. No pathologically enlarged lymph nodes in the abdomen or pelvis. Reproductive: Grossly normal uterus.  No adnexal mass. Other: No pneumoperitoneum, ascites or focal fluid collection. Musculoskeletal: No aggressive appearing focal osseous lesions. Marked degenerative changes in the lumbar spine. IMPRESSION: 1. Status post ileocecal resection. No evidence of local tumor recurrence in the right abdomen. 2. No evidence of metastatic disease in the chest, abdomen or pelvis. 3. Additional findings include mild emphysema, three-vessel coronary atherosclerosis status post CABG and probable chronic left iliac vein stenosis. Electronically Signed   By: Ilona Sorrel M.D.   On: 12/11/2015 14:18    ASSESSMENT & PLAN:  80 yo Caucasian female  1. Mixed goblet cell carcinoid-adenocarcinoma of appendix, [id number], at  least stage IIIB -I previously reviewed her surgical pathology results with her in great details. The stage was also discussed with her. -Her reviewed her recent CT chest with patient and her son, there is no evidence of metastasis. -we discussed that appendiceal mixed carcinoid and adenocarcinoma is rare type tumor, and we do not have large body of clinical trial data to guild [place] about the adjuvant chemotherapy and surveillance. -due to the component of adenocarcinoma, and advanced stage, high risk features including a T4 1 disease, positive nodes, and possible appendiceal perforation, I do think her risk of recurrence is moderate to high. -I recommend her to consider single agent Xeloda as adjuvant chemotherapy to reduce her risk of  cancer recurrence. Given her advanced age and multiple medical comorbidities, I do not think she is a candidate for FOLFOX. -Patient and her son are concerned about the side effects from chemotherapy, especially fatigue, which will likely impact her quality of life. After lengthy discussion, they declined chemotherapy. Given her advanced age, limited performance status, I think that a reasonable decision.  -lab results reviewed with her, she developed mild anemia again, chromogranin A level has increased, although the parallel testing showed quite big difference. 24hr urine 5 HIAA were normal. -I reviewed her surveillance CT scan results, which showed no evidence of recurrence. -given her multiple GI symptoms, mild rectal bleeding, and she has not had a colonoscopy since 2009, I will refer her back to see Dr. Carlean Purl for GI workup  2. Iron deficient anemia secondary to blood loss  -Her anemia resolved 3 month ago, and hb dropped to 11.1 today, possible related mild intermittent rectal bleeding -GI work up  -due to her GI symptoms, she has been off oral iron  -repeat iron study on next visit   3. Nausea, indigestion, constipation, and mild rectal bleeding  -etiology unclear, recent CT scan showed no evidence of cancer recurrence -GI referral  -I will call in zofran. Constipation management reviewed with her, I recommend her to try laxative such as MiraLAX  4. HTN, hypothyroidism, depression, CHF -She'll continue follow-up with her primary care physician  Plan -RTC in 3 months with lab one week before  -GI referral  -I called in zofran  -I spoke with her daughter Sula Soda after her visit    All questions were answered. The patient knows to call the clinic with any problems, questions or concerns. I spent 25 minutes counseling the patient face to face. The total time spent in the appointment was 30 minutes and more than 50% was on counseling.     Truitt Merle, MD 12/17/2015 7:35 PM

## 2015-12-18 ENCOUNTER — Telehealth: Payer: Self-pay

## 2015-12-18 NOTE — Telephone Encounter (Signed)
I left a message for her POA Jeani Hawking to call back

## 2015-12-18 NOTE — Telephone Encounter (Signed)
-----   Message from Gatha Mayer, MD sent at 12/18/2015 10:41 AM EDT ----- We will get her in to see me - might be July before I can Am ccig my RN ----- Message -----    From: Truitt Merle, MD    Sent: 12/17/2015   8:00 PM      To: Gatha Mayer, MD  Jacelyn Grip saw her in 2009 for rectal bleeding and did colonoscopy for her. She was diagnosed with appendiceal mixed adenocarcinoma and carcinoid tumor, and underwent surgery in October 2016. Her recent restaging CT scan did not show any cancer recurrence, however she has been having nausea, indigestion, mild intermitten rectal bleeding, fatigue etc, and developed mild anemia again. Could you see her back and repeat colonoscopy if needed?  Thanks,  Krista Blue

## 2015-12-22 NOTE — Telephone Encounter (Signed)
appt scheduled with Jeani Hawking for 01/27/16 3:30

## 2016-01-16 ENCOUNTER — Ambulatory Visit: Payer: Medicare Other | Admitting: Family Medicine

## 2016-01-27 ENCOUNTER — Encounter: Payer: Self-pay | Admitting: Internal Medicine

## 2016-01-27 ENCOUNTER — Ambulatory Visit (INDEPENDENT_AMBULATORY_CARE_PROVIDER_SITE_OTHER): Payer: Medicare Other | Admitting: Internal Medicine

## 2016-01-27 ENCOUNTER — Telehealth: Payer: Self-pay

## 2016-01-27 VITALS — BP 110/50 | HR 92 | Ht 65.0 in | Wt 192.0 lb

## 2016-01-27 DIAGNOSIS — C189 Malignant neoplasm of colon, unspecified: Secondary | ICD-10-CM | POA: Diagnosis not present

## 2016-01-27 DIAGNOSIS — K5903 Drug induced constipation: Secondary | ICD-10-CM | POA: Diagnosis not present

## 2016-01-27 DIAGNOSIS — R1013 Epigastric pain: Secondary | ICD-10-CM | POA: Diagnosis not present

## 2016-01-27 DIAGNOSIS — Z7901 Long term (current) use of anticoagulants: Secondary | ICD-10-CM

## 2016-01-27 DIAGNOSIS — D509 Iron deficiency anemia, unspecified: Secondary | ICD-10-CM

## 2016-01-27 DIAGNOSIS — I2510 Atherosclerotic heart disease of native coronary artery without angina pectoris: Secondary | ICD-10-CM

## 2016-01-27 NOTE — Progress Notes (Signed)
Referred by Bernerd Limbo, MD [place] [address], [place] [address]  Subjective:    Patient ID: Rachel Conner, female    DOB: 14-Dec-1935, 80 y.o.   MRN: [id number]  CC: indigestion and constipation, anemia  HPI  80 y.o. Female with a PMH of colon polyps on colonoscopy in 2009, appendiceal cancer, hiatal hernia, GERD, DVT on Rivaroxaban, anxiety and depression, presenting to the office with indigestion for the past 2 months. Describes dyspepsia as nausea, early satiety, and regurgitation. Denies any epigastric pain or cramping. Ginger ale helps her nausea, but Zofran has no effect. Has no hematemesis, dysphasia, odynphagia, weight loss, or appetite loss. She associates constipation with stool caliber noted as tiny balls for the past 2 months. Associates lower crampy abdominal pain with a sensation to defecate with difficulty passing.Has been trying Miralax and fleats suppository with having a "good" bowel movement this am.  Has no pain with bowel movement or hematochezia.    Allergies  Allergen Reactions  . Rivaroxaban Other (See Comments)    Unknown rxn  . Niacin Other (See Comments)    Severe flushing  . Nitrogen Other (See Comments)    Hypotension  . Nitroglycerin Other (See Comments)    Hypotension  . Myrbetriq [Mirabegron] Other (See Comments)    Gross hematuria  . Niaspan [Niacin Er] Other (See Comments)    Severe flushing   Outpatient Prescriptions Prior to Visit  Medication Sig Dispense Refill  . ALPRAZolam (XANAX) 0.25 MG tablet Take 0.25 mg by mouth at bedtime as needed for anxiety.    Marland Kitchen amLODipine (NORVASC) 10 MG tablet Take 1 tablet (10 mg total) by mouth daily. 90 tablet 3  . aspirin EC 81 MG tablet Take 81 mg by mouth.    Marland Kitchen atorvastatin (LIPITOR) 80 MG tablet Take 1 tablet (80 mg total) by mouth daily. 90 tablet 3  . docusate sodium (COLACE) 100 MG capsule Take 100 mg by mouth daily.    Marland Kitchen esomeprazole (NEXIUM) 40 MG capsule Take 40 mg by mouth.    .  fluticasone (FLONASE) 50 MCG/ACT nasal spray     . levothyroxine (SYNTHROID, LEVOTHROID) 100 MCG tablet Take 100 mcg by mouth daily.      Marland Kitchen loratadine (CLARITIN) 10 MG tablet Take 10 mg by mouth daily.    Marland Kitchen losartan-hydrochlorothiazide (HYZAAR) 50-12.5 MG tablet Take 1 tablet by mouth daily. 90 tablet 2  . Melatonin 10 MG CAPS Take 1 tablet by mouth at bedtime.    . ondansetron (ZOFRAN) 8 MG tablet Take 1 tablet (8 mg total) by mouth every 8 (eight) hours as needed for nausea. 30 tablet 2  . rivaroxaban (XARELTO) 15 MG TABS tablet Take 1 tablet (15 mg total) by mouth daily with supper. 90 tablet 3  . Multiple Vitamin (MULTIVITAMIN) tablet Take 1 tablet by mouth daily.    Marland Kitchen omega-3 acid ethyl esters (LOVAZA) 1 g capsule Take 1 g by mouth daily.    Marland Kitchen oxymetazoline (AFRIN NASAL SPRAY) 0.05 % nasal spray Place 1 spray into both nostrils 2 (two) times daily. 30 mL 0  . traZODone (DESYREL) 50 MG tablet Take 1 tablet (50 mg total) by mouth daily. 90 tablet 3  . venlafaxine XR (EFFEXOR-XR) 75 MG 24 hr capsule Take 75 mg by mouth daily.    . vitamin B-12 (CYANOCOBALAMIN) 500 MCG tablet Take 1,000 mcg by mouth daily.     No facility-administered medications prior to visit.   Past Medical History  Diagnosis Date  . Arthritis     Osteoarthrosis  . CAD (coronary artery disease) 1999    CABG  . HLD (hyperlipidemia)   . HTN (hypertension)   . Hypothyroidism   . Colon polyps   . Hiatal hernia   . Peripheral neuropathy ([place])   . Anxiety   . Hemorrhoids   . Major depressive disorder ([place])   . MI (myocardial infarction) ([place])   . DVT (deep venous thrombosis) ([place])   . CHF (congestive heart failure) ([place])   . GERD (gastroesophageal reflux disease)   . Cancer [facility])     cancer of the appendix   . Confusion, postoperative    Past Surgical History  Procedure Laterality Date  . Carotid stent  07/1998    stent RCA   . Coronary stent placement  10/2001    circumflex stent  . Lumbar laminectomy       fusion 1971  . Thyroid surgery    . Cholecystectomy  2011  . Esophagogastroduodenoscopy    . Colonoscopy w/ biopsies    . Right thumb Right     Thumb Finger Release  . Coronary artery bypass graft  1999    6 vessel per pt.  . Laparoscopic appendectomy N/A 04/11/2015    Procedure: LAPAROSCOPIC APPENDECTOMY;  Surgeon: Coralie Keens, MD;  Location: [facility];  Service: General;  Laterality: N/A;  Laparoscopic converted to open appendectomy   . Partial colectomy N/A 04/28/2015    Procedure: Ileocecectomy;  Surgeon: Coralie Keens, MD;  Location: [facility];  Service: General;  Laterality: N/A;   Social History   Social History  . Marital Status: Married    Spouse Name: N/A  . Number of Children: 3  . Years of Education: N/A   Occupational History  . retired    Social History Main Topics  . Smoking status: Former Smoker -- 1.00 packs/day for 45 years    Quit date: 07/12/1997  . Smokeless tobacco: Never Used     Comment: quit smoking age 37  . Alcohol Use: No  . Drug Use: No  . Sexual Activity: Not Asked   Other Topics Concern  . None   Social History Narrative   Widowed, lived [place] and his wife will stay with her after release from facility   Retired from being EMT/med tech   Originally from [place]   Able to perform ADL's independently   Family History  Problem Relation Age of Onset  . Heart disease Father   . Heart disease Mother   . Heart disease Brother   . Heart disease Other     grandfather (side unknown)  . Colon cancer Neg Hx   . Cancer Sister     non hodgkins lymphoma    Review of Systems See HPI, all other systems are negative    Objective:   Physical Exam  @BP  110/50 mmHg  Pulse 92  Ht 5\' 5"  (1.651 m)  Wt 192 lb (87.091 kg)  BMI 31.95 kg/m2@  General:  Well-developed, well-nourished and in no acute distress Eyes:  anicteric. ENT:   Mouth and posterior pharynx free of lesions.  Neck:   supple w/o thyromegaly or mass.  Lungs: Clear to  auscultation bilaterally. Heart:  S1S2, no rubs, murmurs, gallops. Abdomen:  soft, mildly diffuse tenderness, no hepatosplenomegaly, hernia, or mass and BS+.  Rectal: Deferred Lymph:  no cervical or supraclavicular adenopathy. Extremities:   no edema, cyanosis or clubbing Skin   no rash. Neuro:  A&O x 3.  Psych:  appropriate mood and  Affect.   Data Reviewed: Labs from 12/11/15 CT scans from 12/11/15 Chromogranin A from 12/11/15 Surgical pathology 04/28/15       Assessment & Plan:   Encounter Diagnoses  Name Primary?  Marland Kitchen Anemia, iron deficiency Yes  . 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 Dyspepsia   . Drug-induced constipation with proper administration   . Long term (current) use of anticoagulants - Xarelto    Based on her iron deficiency anemia and dyspepsia symptoms we will schedule a EGD. Will also schedule a colonoscopy due to her recent history of appendiceal cancer and chromogranin A elevation. Will have her hold  Xarelto the day before procedure. Will clarify that w/ Dr. Johnsie Cancel. The risks and benefits as well as alternatives of endoscopic procedure(s) have been discussed and reviewed. All questions answered. The patient agrees to proceed. She understands rare but real risk of stroke/thromosis off Xarrelto. Constipation symptoms have resolved today with miralax and laxitives. Likely medication induced constipation.  [place] Ormond PA-S  I have seen the patient with Mr. Venetia Maxon and he has served as a Education administrator.  I appreciate the opportunity to care for this patient. [id number] E, MD Truitt Merle, MD

## 2016-01-27 NOTE — Patient Instructions (Addendum)
   You have been scheduled for an endoscopy and colonoscopy. Please follow the written instructions given to you at your visit today. Please pick up your prep supplies at the pharmacy.   If you use inhalers (even only as needed), please bring them with you on the day of your procedure.  You will be contaced by our office prior to your procedure for directions on holding your xarelto.  If you do not hear from our office 1 week prior to your scheduled procedure, please call [phone] to discuss.  You will stay on your Aspirin.  Normal BMI (Body Mass Index- based on height and weight) is between 23 and 30. Your BMI today is Body mass index is 31.95 kg/(m^2). Marland Kitchen Please consider follow up  regarding your BMI with your Primary Care Provider.   I appreciate the opportunity to care for you. Silvano Rusk, MD, [facility]

## 2016-01-27 NOTE — Telephone Encounter (Signed)
Ok to stop xarelto 2 days before EGD/colonoscopy

## 2016-01-27 NOTE — Telephone Encounter (Signed)
Left message for Jeani Hawking (daughter) and also called Anifer and left her a message to call me.

## 2016-01-27 NOTE — Telephone Encounter (Signed)
 GI [address]  [place] [address]  RE: Rachel Conner DOB: [birth year] MRN: [id number]   Dear Jenkins Rouge M.D.,    We have scheduled the above patient for an endoscopic procedure. Our records show that she is on anticoagulation therapy.   Please advise as to how long the patient may come off her therapy of xarelto prior to the colonoscopy/EGD procedure, which is scheduled for 03/11/16.  Please fax back/ or route the completed form to Juandavid D[place]man [place], [place] at [phone].   Sincerely,    Silvano Rusk, M.D.

## 2016-01-27 NOTE — Assessment & Plan Note (Signed)
Hold 1 d before

## 2016-01-28 ENCOUNTER — Encounter: Payer: Self-pay | Admitting: Internal Medicine

## 2016-01-28 NOTE — Telephone Encounter (Signed)
Informed daughter Jeani Hawking about Mom holding the xarelto for 2 days prior to EGD/colon and she verbalized understanding and will tell Mom.

## 2016-02-09 ENCOUNTER — Telehealth: Payer: Self-pay | Admitting: *Deleted

## 2016-02-09 MED ORDER — PROCHLORPERAZINE MALEATE 10 MG PO TABS: 10.0000 mg | ORAL_TABLET | Freq: Four times a day (QID) | ORAL | 0 refills | Status: DC | PRN

## 2016-02-09 NOTE — Telephone Encounter (Signed)
Received call from daughter, Jeani Hawking stating pt is having a lot of nausea & zofran is not working & would like something called in.  Discussed with Selena Lesser NP & compazine script sent in.

## 2016-03-11 ENCOUNTER — Encounter: Payer: Self-pay | Admitting: Internal Medicine

## 2016-03-11 ENCOUNTER — Other Ambulatory Visit (INDEPENDENT_AMBULATORY_CARE_PROVIDER_SITE_OTHER): Payer: Medicare Other

## 2016-03-11 ENCOUNTER — Ambulatory Visit (AMBULATORY_SURGERY_CENTER): Payer: Medicare Other | Admitting: Internal Medicine

## 2016-03-11 VITALS — BP 132/70 | HR 80 | Temp 97.8°F | Resp 19 | Ht 65.0 in | Wt 192.0 lb

## 2016-03-11 DIAGNOSIS — R1013 Epigastric pain: Secondary | ICD-10-CM

## 2016-03-11 DIAGNOSIS — D509 Iron deficiency anemia, unspecified: Secondary | ICD-10-CM

## 2016-03-11 LAB — CBC
HEMATOCRIT: 32.2 % — AB (ref 36.0–46.0)
HEMOGLOBIN: 10.3 g/dL — AB (ref 12.0–15.0)
MCHC: 32 g/dL (ref 30.0–36.0)
MCV: 74.4 fl — ABNORMAL LOW (ref 78.0–100.0)
PLATELETS: 432 10*3/uL — AB (ref 150.0–400.0)
RBC: 4.32 Mil/uL (ref 3.87–5.11)
RDW: 19.2 % — AB (ref 11.5–15.5)
WBC: 11 10*3/uL — AB (ref 4.0–10.5)

## 2016-03-11 LAB — FERRITIN: Ferritin: 8.6 ng/mL — ABNORMAL LOW (ref 10.0–291.0)

## 2016-03-11 MED ORDER — MIRTAZAPINE 7.5 MG PO TABS: 7.5000 mg | ORAL_TABLET | Freq: Every day | ORAL | 1 refills | Status: DC

## 2016-03-11 MED ORDER — SODIUM CHLORIDE 0.9 % IV SOLN: 500.0000 mL | INTRAVENOUS | Status: DC

## 2016-03-11 NOTE — Progress Notes (Signed)
Iron remains low Start ferrous sulfate 325 mg bid She should f/u me in Nov/Dec

## 2016-03-11 NOTE — Op Note (Addendum)
[place] Patient Name: Rachel Conner Procedure Date: 03/11/2016 2:10 PM MRN: [id number] Endoscopist: Gatha Mayer , MD Age: 80 Referring MD:  Date of Birth: [birth year] Gender: Female Account #: [id number] Procedure:                Upper GI endoscopy Indications:              Iron deficiency anemia, Dyspepsia, Heartburn Medicines:                Propofol per Anesthesia, Monitored Anesthesia Care Procedure:                Pre-Anesthesia Assessment:                           - Prior to the procedure, a History and Physical                            was performed, and patient medications and                            allergies were reviewed. The patient's tolerance of                            previous anesthesia was also reviewed. The risks                            and benefits of the procedure and the sedation                            options and risks were discussed with the patient.                            All questions were answered, and informed consent                            was obtained. Prior Anticoagulants: The patient                            last took Xarelto (rivaroxaban) 2 days prior to the                            procedure. ASA Grade Assessment: III - A patient                            with severe systemic disease. After reviewing the                            risks and benefits, the patient was deemed in                            satisfactory condition to undergo the procedure.                           After obtaining informed consent, the endoscope was  passed under direct vision. Throughout the                            procedure, the patient's blood pressure, pulse, and                            oxygen saturations were monitored continuously. The                            Model GIF-HQ190 [id number]) scope was introduced                            through the mouth, and advanced to the second part                         of duodenum. The upper GI endoscopy was                            accomplished without difficulty. The patient                            tolerated the procedure well. Scope In: Scope Out: Findings:                 The esophagus was normal.                           The stomach was normal.                           The examined duodenum was normal. Complications:            No immediate complications. Estimated Blood Loss:     Estimated blood loss: none. Impression:               - Normal esophagus.                           - Normal stomach.                           - Normal examined duodenum.                           - No specimens collected. Recommendation:           - Patient has a contact number available for                            emergencies. The signs and symptoms of potential                            delayed complications were discussed with the                            patient. Return to normal activities tomorrow.  Written discharge instructions were provided to the                            patient.                           - Resume previous diet.                           - Continue present medications.                           - No repeat upper endoscopy.                           - See the other procedure note for documentation of                            additional recommendations.                           - CBC and Ferritin today                           will call results and if still having dyspepsia -                            consider additional tx Gatha Mayer, MD 03/11/2016 2:51:14 PM This report has been signed electronically. Addendum Number: 1   Addendum Date: 03/11/2016 3:04:29 PM      Have rxed mirtazipine to treat insomnia and nausea.      7.5 mg hs      to stop melatonin Gatha Mayer, MD 03/11/2016 3:04:58 PM This report has been signed electronically.

## 2016-03-11 NOTE — Progress Notes (Signed)
A and O x3. Report to RN. Tolerated MAC anesthesia well.Teeth unchanged after procedure. 

## 2016-03-11 NOTE — Op Note (Signed)
[place] Patient Name: Rachel Conner Procedure Date: 03/11/2016 2:09 PM MRN: [id number] Endoscopist: Gatha Mayer , MD Age: 80 Referring MD:  Date of Birth: 08/08/1935 Gender: Female Account #: [id number] Procedure:                Colonoscopy Indications:              Unexplained iron deficiency anemia Medicines:                Propofol per Anesthesia, Monitored Anesthesia Care Procedure:                Pre-Anesthesia Assessment:                           - Prior to the procedure, a History and Physical                            was performed, and patient medications and                            allergies were reviewed. The patient's tolerance of                            previous anesthesia was also reviewed. The risks                            and benefits of the procedure and the sedation                            options and risks were discussed with the patient.                            All questions were answered, and informed consent                            was obtained. Prior Anticoagulants: The patient                            last took Xarelto (rivaroxaban) 2 days prior to the                            procedure. ASA Grade Assessment: III - A patient                            with severe systemic disease. After reviewing the                            risks and benefits, the patient was deemed in                            satisfactory condition to undergo the procedure.                           After obtaining informed consent, the colonoscope  was passed under direct vision. Throughout the                            procedure, the patient's blood pressure, pulse, and                            oxygen saturations were monitored continuously. The                            Model [id number]-H190L [id number]) scope was introduced                            through the anus and advanced to the the   ileocolonic anastomosis. The colonoscopy was                            performed without difficulty. The patient tolerated                            the procedure well. The quality of the bowel                            preparation was excellent. The bowel preparation                            used was Miralax. The terminal ileum and the rectum                            were photographed. Scope In: 2:23:53 PM Scope Out: 2:37:28 PM Scope Withdrawal Time: 0 hours 7 minutes 17 seconds  Total Procedure Duration: 0 hours 13 minutes 35 seconds  Findings:                 The perianal and digital rectal examinations were                            normal.                           There was evidence of a prior end-to-side                            ileo-colonic anastomosis in the ascending colon.                            This was patent and was characterized by healthy                            appearing mucosa. The anastomosis was traversed.                           The exam was otherwise without abnormality on                            direct and retroflexion views.  The terminal ileum appeared normal. Complications:            No immediate complications. Estimated Blood Loss:     Estimated blood loss: none. Impression:               - Patent end-to-side ileo-colonic anastomosis,                            characterized by healthy appearing mucosa.                           - The examination was otherwise normal on direct                            and retroflexion views.                           - The examined portion of the ileum was normal.                           - No specimens collected. Recommendation:           - Patient has a contact number available for                            emergencies. The signs and symptoms of potential                            delayed complications were discussed with the                            patient. Return to  normal activities tomorrow.                            Written discharge instructions were provided to the                            patient.                           - Resume previous diet.                           - Continue present medications.                           - Repeat colonoscopy in 3 years for surveillance.                           - CBC and Ferritin today                           Consider repeat colonoscopy 3 yrs given hx mixed                            adenocarcinoma/carcinoid of appendix Gatha Mayer, MD 03/11/2016 2:54:49 PM This report has been signed electronically.

## 2016-03-11 NOTE — Patient Instructions (Addendum)
I did not find any problems on today's examinations.  Will consider repeating a colonoscopy in 3 years.  Please go to basement for labs today.  Restart xarelto today.  I am prescribing mirtazapine to help you sleep and to help nausea.   I appreciate the opportunity to care for you. Gatha Mayer, MD, FACG   YOU HAD AN ENDOSCOPIC PROCEDURE TODAY AT [facility]:   Refer to the procedure report that was given to you for any specific questions about what was found during the examination.  If the procedure report does not answer your questions, please call your gastroenterologist to clarify.  If you requested that your care partner not be given the details of your procedure findings, then the procedure report has been included in a sealed envelope for you to review at your convenience later.  YOU SHOULD EXPECT: Some feelings of bloating in the abdomen. Passage of more gas than usual.  Walking can help get rid of the air that was put into your GI tract during the procedure and reduce the bloating. If you had a lower endoscopy (such as a colonoscopy or flexible sigmoidoscopy) you may notice spotting of blood in your stool or on the toilet paper. If you underwent a bowel prep for your procedure, you may not have a normal bowel movement for a few days.  Please Note:  You might notice some irritation and congestion in your nose or some drainage.  This is from the oxygen used during your procedure.  There is no need for concern and it should clear up in a day or so.  SYMPTOMS TO REPORT IMMEDIATELY:   Following lower endoscopy (colonoscopy or flexible sigmoidoscopy):  Excessive amounts of blood in the stool  Significant tenderness or worsening of abdominal pains  Swelling of the abdomen that is new, acute  Fever of 100F or higher   Following upper endoscopy (EGD)  Vomiting of blood or coffee ground material  New chest pain or pain under the shoulder blades  Painful  or persistently difficult swallowing  New shortness of breath  Fever of 100F or higher  Black, tarry-looking stools  For urgent or emergent issues, a gastroenterologist can be reached at any hour by calling [phone].   DIET:  We do recommend a small meal at first, but then you may proceed to your regular diet.  Drink plenty of fluids but you should avoid alcoholic beverages for 24 hours.  ACTIVITY:  You should plan to take it easy for the rest of today and you should NOT DRIVE or use heavy machinery until tomorrow (because of the sedation medicines used during the test).    FOLLOW UP: Our staff will call the number listed on your records the next business day following your procedure to check on you and address any questions or concerns that you may have regarding the information given to you following your procedure. If we do not reach you, we will leave a message.  However, if you are feeling well and you are not experiencing any problems, there is no need to return our call.  We will assume that you have returned to your regular daily activities without incident.  If any biopsies were taken you will be contacted by phone or by letter within the next 1-3 weeks.  Please call us at [phone] if you have not heard about the biopsies in 3 weeks.    SIGNATURES/CONFIDENTIALITY: You and/or your care partner have signed  paperwork which will be entered into your electronic medical record.  These signatures attest to the fact that that the information above on your After Visit Summary has been reviewed and is understood.  Full responsibility of the confidentiality of this discharge information lies with you and/or your care-partner.  Take your remeron as directed.  Re-start your xarelto today per Dr. Carlean Purl.

## 2016-03-12 ENCOUNTER — Telehealth: Payer: Self-pay

## 2016-03-12 NOTE — Telephone Encounter (Signed)
  Follow up Call-  Call back number 03/11/2016 03/11/2016  Post procedure Call Back phone  # [phone]  call daughter [phone]  Permission to leave phone message - Yes  Some recent data might be hidden     Patient questions:  Do you have a fever, pain , or abdominal swelling? No. Pain Score  0 *  Have you tolerated food without any problems? Yes.    Have you been able to return to your normal activities? Yes.    Do you have any questions about your discharge instructions: Diet   No. Medications  No. Follow up visit  No.  Do you have questions or concerns about your Care? No.  Actions: * If pain score is 4 or above: No action needed, pain <4.   I spoke with the pt's daughter this am.  Pt had no problems.  Pt's daughter also thanked [place] for the excellent care her mother received. maw

## 2016-03-26 ENCOUNTER — Other Ambulatory Visit: Payer: Self-pay | Admitting: Internal Medicine

## 2016-04-01 ENCOUNTER — Telehealth: Payer: Self-pay | Admitting: Cardiovascular Disease

## 2016-04-01 NOTE — Telephone Encounter (Addendum)
Rachel Conner, physical therapist with [facility] is calling about patient's BP dropping when standing today during her visit. Patient's BP sitting was 132/70 and standing was 106/66. Patient reporting feeling lightheaded, dizzy and having a headache. Rachel Conner stated patient's HR get up to 102 with activity and 80 at rest. Patient is due for an office visit in November.   Called patient's daughter (DPR) to check on patient. Patient has been having lightheadedness and dizziness for a couple of weeks when standing. Also daughter reported patient has lost about 40 lbs since her last visit. Since patient seems to be having orthostatic hypotension, will make an appointment to see PA sooner than November. Made an appointment with Rachel Range PA for next week to see if patient needs medication adjustments. Informed patient's daughter to have patient slowly make positional changes. Informed her that BP and HR are fine, but will forward message  to Rachel Conner for further advisement.   Daughter also wanted to see if Rachel Conner would review patient's CT she had with Dr. Burr Medico.

## 2016-04-01 NOTE — Telephone Encounter (Signed)
New Message:      She  Is with the pt now,she would like for you to call her asap.It is not an emergency but needs to talk to you while she is with the pt. Pt's blood pressure is fluctuating.

## 2016-04-06 NOTE — Progress Notes (Signed)
Cardiology Office Note    Date:  04/07/2016   ID:  FARREN EDGELL, DOB May 15, 1936, MRN [id number]  PCP:  Phineas Inches, MD  Cardiologist:  Dr. Johnsie Cancel   CC: Dizziness with standing   History of Present Illness:  Rachel Conner is a 80 y.o. 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   She has a history of CAD and bypass surgery in 1999. She subsequently had stenting of the native RCA and has an occluded vein graft to the right. She also has a stent in the native obtuse marginal branch. Her last heart catheterization was in 2003. She was diagnosed with an extensive lower extremity DVT in 2014. She was treated with Coumadin for some time but then switched to Xarelto 15 mg daily. She's had issues with memory and seen at [facility] where she was diagnosed with possible vascular dementia. Her heart rate was noted to be low and Lopressor was stopped.  She she complained of chest pain and had a preop Myoview done in 09/2014 which showed no ischemia or infarct EF 66%.   In 03/2015 she presented with abdominal pain, intermittent nausea and vomiting, diarrhea. She was seen at [facility] emergency room in September, and CT of abdomen and pelvis revealed dilated appendix. She was referred to general surgeon Dr. Belenda Cruise, who brought her to OR on 04/11/2015 for appendectomy. She tolerated surgery well. The surgical path reviewed mixed carcinoid and adenocarcinoma, with area of small perforation. She was brought back to the OR on 04/28/2015, and underwent right hemicolectomy.   Recently seen by GI for reflux and unexplained IDA. She underwent EGD/colonoscopy which were both normal. Recent CT chest, abdomen or Pelvis with no metastatic disease.   Today she presents to clinic for evaluation of  dizziness when standing. She has lost significant amounts of weight due to depression (her daughter is a drug addict). She has been eating and drinking okay but has lost 40 lbs over the last 6 months. No chest pain or SOB. No LE edema, orthopnea or PND. She does get LE pain with walking and also at rest. She doesn't get dizzy unless she stands up fast. No syncope.    Past Medical History:  Diagnosis Date  . Anemia   . Anxiety   . Arthritis    Osteoarthrosis  . CAD (coronary artery disease) 1999   CABG  . Cancer [facility])    cancer of the appendix   . Cataract    rt eye per pt  . CHF (congestive heart failure) ([place])   . Colon polyps   . Confusion, postoperative   . DVT (deep venous thrombosis) ([place])   . GERD (gastroesophageal reflux disease)   . Hemorrhoids   . Hiatal hernia   . HLD (hyperlipidemia)   . HTN (hypertension)   . Hypothyroidism   . Major depressive disorder ([place])   . MI (myocardial infarction) ([place])   . Peripheral neuropathy [facility])     Past Surgical History:  Procedure Laterality Date  . CAROTID STENT  07/1998   stent RCA   . CHOLECYSTECTOMY  2011  . COLON SURGERY  03/2015   partial colon to remove cancer in appendix  . COLONOSCOPY W/ BIOPSIES    . CORONARY ARTERY BYPASS GRAFT  1999   6 vessel  per pt.  . CORONARY STENT PLACEMENT  10/2001   circumflex stent  . ESOPHAGOGASTRODUODENOSCOPY    . LAPAROSCOPIC APPENDECTOMY N/A 04/11/2015   Procedure: LAPAROSCOPIC APPENDECTOMY;  Surgeon: Coralie Keens, MD;  Location: [place];  Service: General;  Laterality: N/A;  Laparoscopic converted to open appendectomy   . LUMBAR LAMINECTOMY     fusion 1971  . PARTIAL COLECTOMY N/A 04/28/2015   Procedure: Ileocecectomy;  Surgeon: Coralie Keens, MD;  Location: [place];  Service: General;  Laterality: N/A;  . Right Thumb Right    Thumb Finger Release  . THYROID SURGERY      Current Medications: Outpatient Medications Prior to Visit  Medication Sig Dispense Refill  . ALPRAZolam  (XANAX) 0.25 MG tablet Take 0.25 mg by mouth at bedtime as needed for anxiety.    Marland Kitchen aspirin EC 81 MG tablet Take 81 mg by mouth.    Marland Kitchen atorvastatin (LIPITOR) 80 MG tablet Take 1 tablet (80 mg total) by mouth daily. 90 tablet 3  . DULoxetine (CYMBALTA) 20 MG capsule Take 20 mg by mouth daily.    Marland Kitchen esomeprazole (NEXIUM) 40 MG capsule Take 40 mg by mouth daily.     . fluticasone (FLONASE) 50 MCG/ACT nasal spray Place 1 spray into both nostrils daily as needed for allergies.     Marland Kitchen levothyroxine (SYNTHROID, LEVOTHROID) 100 MCG tablet Take 100 mcg by mouth daily.      Marland Kitchen loratadine (CLARITIN) 10 MG tablet Take 10 mg by mouth daily.    Marland Kitchen losartan-hydrochlorothiazide (HYZAAR) 50-12.5 MG tablet Take 1 tablet by mouth daily. 90 tablet 2  . mirtazapine (REMERON) 7.5 MG tablet TAKE 1 TABLET(7.5 MG) BY MOUTH AT BEDTIME 90 tablet 1  . rivaroxaban (XARELTO) 15 MG TABS tablet Take 1 tablet (15 mg total) by mouth daily with supper. 90 tablet 3  . amLODipine (NORVASC) 10 MG tablet Take 1 tablet (10 mg total) by mouth daily. 90 tablet 3  . Cyanocobalamin (B-12) 2500 MCG TABS Take 1 tablet by mouth daily.    Marland Kitchen docusate sodium (COLACE) 100 MG capsule Take 100 mg by mouth daily.    . Melatonin 10 MG CAPS Take 1 tablet by mouth at bedtime.    . ondansetron (ZOFRAN) 8 MG tablet Take 1 tablet (8 mg total) by mouth every 8 (eight) hours as needed for nausea. (Patient not taking: Reported on 04/07/2016) 30 tablet 2  . prochlorperazine (COMPAZINE) 10 MG tablet Take 1 tablet (10 mg total) by mouth every 6 (six) hours as needed for nausea or vomiting. (Patient not taking: Reported on 04/07/2016) 30 tablet 0   Facility-Administered Medications Prior to Visit  Medication Dose Route Frequency Provider Last Rate Last Dose  . 0.9 %  sodium chloride infusion  500 mL Intravenous Continuous Gatha Mayer, MD         Allergies:   Rivaroxaban; Niacin; Nitrogen; Nitroglycerin; Myrbetriq [mirabegron]; and Niaspan [niacin er]   Social  History   Social History  . Marital status: Married    Spouse name: N/A  . Number of children: 3  . Years of education: N/A   Occupational History  . retired    Social History Main Topics  . Smoking status: Former Smoker    Packs/day: 1.00    Years: 45.00    Quit date: 07/12/1997  . Smokeless tobacco: Never Used     Comment: quit smoking age 75  . Alcohol use No  . Drug use: No  . Sexual activity: Not Asked  Other Topics Concern  . None   Social History Narrative   Widowed, lived [place] and his wife will stay with her after release from facility   Retired from being EMT/med tech   Originally from [place]   Able to perform ADL's independently     Family History:  The patient's family history includes Cancer in her sister; Heart disease in her brother, father, mother, and other.     ROS:   Please see the history of present illness.    ROS All other systems reviewed and are negative.   PHYSICAL EXAM:   VS:  BP 126/73   Pulse 86   Ht 5\' 5"  (1.651 m)   Wt 190 lb 9.6 oz (86.5 kg)   BMI 31.72 kg/m    GEN: Well nourished, well developed, in no acute distress  HEENT: normal  Neck: no JVD, carotid bruits, or masses Cardiac: RRR; no murmurs, rubs, or gallops,no edema  Respiratory:  clear to auscultation bilaterally, normal work of breathing GI: soft, nontender, nondistended, + BS MS: no deformity or atrophy  Skin: warm and dry, no rash Neuro:  Alert and Oriented x 3, Strength and sensation are intact Psych: euthymic mood, full affect  Wt Readings from Last 3 Encounters:  04/07/16 190 lb 9.6 oz (86.5 kg)  03/11/16 192 lb (87.1 kg)  01/27/16 192 lb (87.1 kg)      Studies/Labs Reviewed:   EKG:  EKG is ordered today.  The ekg ordered today demonstrates NSR, TWI inferior leads similar to previous tracings  Recent Labs: 12/11/2015: ALT 10; BUN 17.6; Creatinine 0.8; Potassium 3.9; Sodium 139 03/11/2016: Hemoglobin 10.3; Platelets 432.0   Lipid Panel    Component  Value Date/Time   CHOL 166 11/07/2013 1158   TRIG 191.0 (H) 11/07/2013 1158   HDL 47.00 11/07/2013 1158   CHOLHDL 4 11/07/2013 1158   VLDL 38.2 11/07/2013 1158   LDLCALC 81 11/07/2013 1158    Additional studies/ records that were reviewed today include:  09/2014 Overall Impression:  Normal stress nuclear study. There is no scar or ischemia. This is a low risk scan. There is no significant change since the report of the study from September, 2008.  LV Ejection Fraction: 66%.  LV Wall Motion:  Normal Wall Motion    ASSESSMENT & PLAN:   Dizziness with standing: orthostatics today positive: 128/73--> 122/66--> 100/66. Will drop amlodipine from 10mg  --> 5mg  daily. If this doesn't help we can stop it completely.   HLD: continue statin. Will check lipids today as she is fasting  CAD s/p CABG and PCI: low risk myoview in 09/2014. No chest pain.   Appendiceal cancer s/p appendectomy and hemicolectomy (04/2015): followed closely by Dr. Burr Medico  Hx of DVT: continue on Xarelto   IDA: recent negative EGD/colonscopy. Patient requests that we check her blood counts today  LE pain: sometimes worse with walking. Will get LE arterial dopplers.   Medication Adjustments/Labs and Tests Ordered: Current medicines are reviewed at length with the patient today.  Concerns regarding medicines are outlined above.  Medication changes, Labs and Tests ordered today are listed in the Patient Instructions below. Patient Instructions  Medication Instructions:  Your physician has recommended you make the following change in your medication:  1.  DECREASE the Amlodipine to 5 mg taking 1 tablet daily   Labwork: TODAY:  LIPID PANEL  Testing/Procedures: Your physician has requested that you have a lower extremity arterial exercise duplex. During this test, exercise and ultrasound are used to  evaluate arterial blood flow in the legs. Allow one hour for this exam. There are no restrictions or special  instructions.   Follow-Up: Your physician recommends that you schedule a follow-up appointment in: 2 [place] Johnsie Cancel    Any Other Special Instructions Will Be Listed Below (If Applicable).  Vascular Ultrasound An ultrasound, also called sonography or ultrasonography, uses harmless sound waves to take pictures of the inside of your body. The pictures are taken with a device called a transducer that is held up against your body. The continually changing pictures can be recorded on videotape or film. A vascular ultrasound is a painless test to see if you have blood flow problems or clots in your blood vessels. It may be done to look at blood vessels almost anywhere in the body. There are several types of ultrasounds that can be done to look at the blood vessels. They include:  Continuous wave Doppler ultrasound. This type of ultrasound uses the change in pitch of sound waves to provide information about blood flow through a blood vessel. During the test, a health care provider listens to the sounds produced by the transducer.  Duplex ultrasound. This type of ultrasound uses standard ultrasound methods to produce a picture of a blood vessel and surrounding organs. In addition, a computer provides information about the speed and direction of blood flow through the blood vessel. With this type of ultrasound it is possible to see the structures inside the body and to evaluate blood flow within those structures at the same time.  Color Doppler ultrasound. This type of ultrasound uses standard ultrasound methods to produce a picture of a blood vessel. In addition, a computer converts the Doppler sounds into colors that are overlaid on the picture of the blood vessel. These colors represent the speed and direction of blood flow through the vessel.  Power Doppler ultrasound. This type of ultrasound is up to five times more sensitive than color Doppler ultrasound. Power Doppler ultrasound can also get  pictures that are difficult or impossible to get using standard color Doppler ultrasound. Power Doppler ultrasound is most commonly used to evaluate blood flow through vessels within organs, such as the liver or kidneys.  Transcranial Doppler ultrasound. This type of ultrasound looks at blood flow in blood vessels throughout the brain. It can reveal the presence of narrow arteries, clots blocking the vessels, or malformed blood vessels. RISKS AND COMPLICATIONS There are no known risks or complications of having an ultrasound. BEFORE THE PROCEDURE  If the ultrasound scan involves your upper abdomen, you may be directed not to eat, smoke, or chew gum the morning of your exam. Follow your health care provider's instructions.  During the test, a gel will be applied to your skin. Wear clothing that is easily washable in case the gel gets on your clothes. PROCEDURE  A gel will be applied to your skin. It may feel cool.  The transducer will be placed on the area to be examined.  Pictures will be taken. They will be displayed on one or more monitors that look like small television screens. AFTER THE PROCEDURE  You can safely drive home and return to regular activities immediately after your exam.  Keep follow-up visits as directed by your health care provider.  Ask when your test results will be ready. It is your responsibility to get your test results.   This information is not intended to replace advice given to you by your health care provider. Make sure you  discuss any questions you have with your health care provider.   Document Released: 07/09/2004 Document Revised: 07/19/2014 Document Reviewed: 09/20/2013 Elsevier Interactive Patient Education [employer].    If you need a refill on your cardiac medications before your next appointment, please call your pharmacy. '    Signed, Rachel Conner, Hershal Coria  04/07/2016 9:32 AM    [address], [place], [place]  [address] Phone: [phone]; Fax: [phone]

## 2016-04-07 ENCOUNTER — Other Ambulatory Visit: Payer: Self-pay | Admitting: Physician Assistant

## 2016-04-07 ENCOUNTER — Encounter: Payer: Self-pay | Admitting: Physician Assistant

## 2016-04-07 ENCOUNTER — Ambulatory Visit (INDEPENDENT_AMBULATORY_CARE_PROVIDER_SITE_OTHER): Payer: Medicare Other | Admitting: Physician Assistant

## 2016-04-07 VITALS — BP 126/73 | HR 86 | Ht 65.0 in | Wt 190.6 lb

## 2016-04-07 DIAGNOSIS — M79604 Pain in right leg: Secondary | ICD-10-CM

## 2016-04-07 DIAGNOSIS — I119 Hypertensive heart disease without heart failure: Secondary | ICD-10-CM

## 2016-04-07 DIAGNOSIS — I951 Orthostatic hypotension: Secondary | ICD-10-CM | POA: Diagnosis not present

## 2016-04-07 DIAGNOSIS — E7800 Pure hypercholesterolemia, unspecified: Secondary | ICD-10-CM | POA: Diagnosis not present

## 2016-04-07 DIAGNOSIS — Z86718 Personal history of other venous thrombosis and embolism: Secondary | ICD-10-CM

## 2016-04-07 DIAGNOSIS — I2510 Atherosclerotic heart disease of native coronary artery without angina pectoris: Secondary | ICD-10-CM | POA: Diagnosis not present

## 2016-04-07 DIAGNOSIS — D509 Iron deficiency anemia, unspecified: Secondary | ICD-10-CM

## 2016-04-07 DIAGNOSIS — M79605 Pain in left leg: Secondary | ICD-10-CM

## 2016-04-07 LAB — LIPID PANEL
CHOL/HDL RATIO: 3.3 ratio (ref <=5.0)
Cholesterol: 144 mg/dL (ref 125–200)
HDL: 43 mg/dL — ABNORMAL LOW (ref >=46)
LDL Cholesterol: 61 mg/dL (ref <130)
Triglycerides: 199 mg/dL — ABNORMAL HIGH (ref <150)
VLDL: 40 mg/dL — AB (ref <30)

## 2016-04-07 LAB — CBC
HEMATOCRIT: 40.8 % (ref 35.0–45.0)
Hemoglobin: 12.6 g/dL (ref 11.7–15.5)
MCH: 25.9 pg — ABNORMAL LOW (ref 27.0–33.0)
MCHC: 30.9 g/dL — ABNORMAL LOW (ref 32.0–36.0)
MCV: 84 fL (ref 80.0–100.0)
MPV: 9.9 fL (ref 7.5–12.5)
PLATELETS: 317 10*3/uL (ref 140–400)
RBC: 4.86 MIL/uL (ref 3.80–5.10)
RDW: 24.9 % — AB (ref 11.0–15.0)
WBC: 9.7 10*3/uL (ref 3.8–10.8)

## 2016-04-07 MED ORDER — AMLODIPINE BESYLATE 5 MG PO TABS: 5.0000 mg | ORAL_TABLET | Freq: Every day | ORAL | 3 refills | Status: DC

## 2016-04-07 NOTE — Patient Instructions (Addendum)
Medication Instructions:  Your physician has recommended you make the following change in your medication:  1.  DECREASE the Amlodipine to 5 mg taking 1 tablet daily   Labwork: TODAY:  LIPID PANEL  Testing/Procedures: Your physician has requested that you have a lower extremity arterial exercise duplex. During this test, exercise and ultrasound are used to evaluate arterial blood flow in the legs. Allow one hour for this exam. There are no restrictions or special instructions.   Follow-Up: Your physician recommends that you schedule a follow-up appointment in: 2 [place] Johnsie Cancel    Any Other Special Instructions Will Be Listed Below (If Applicable).  Vascular Ultrasound An ultrasound, also called sonography or ultrasonography, uses harmless sound waves to take pictures of the inside of your body. The pictures are taken with a device called a transducer that is held up against your body. The continually changing pictures can be recorded on videotape or film. A vascular ultrasound is a painless test to see if you have blood flow problems or clots in your blood vessels. It may be done to look at blood vessels almost anywhere in the body. There are several types of ultrasounds that can be done to look at the blood vessels. They include:  Continuous wave Doppler ultrasound. This type of ultrasound uses the change in pitch of sound waves to provide information about blood flow through a blood vessel. During the test, a health care provider listens to the sounds produced by the transducer.  Duplex ultrasound. This type of ultrasound uses standard ultrasound methods to produce a picture of a blood vessel and surrounding organs. In addition, a computer provides information about the speed and direction of blood flow through the blood vessel. With this type of ultrasound it is possible to see the structures inside the body and to evaluate blood flow within those structures at the same  time.  Color Doppler ultrasound. This type of ultrasound uses standard ultrasound methods to produce a picture of a blood vessel. In addition, a computer converts the Doppler sounds into colors that are overlaid on the picture of the blood vessel. These colors represent the speed and direction of blood flow through the vessel.  Power Doppler ultrasound. This type of ultrasound is up to five times more sensitive than color Doppler ultrasound. Power Doppler ultrasound can also get pictures that are difficult or impossible to get using standard color Doppler ultrasound. Power Doppler ultrasound is most commonly used to evaluate blood flow through vessels within organs, such as the liver or kidneys.  Transcranial Doppler ultrasound. This type of ultrasound looks at blood flow in blood vessels throughout the brain. It can reveal the presence of narrow arteries, clots blocking the vessels, or malformed blood vessels. RISKS AND COMPLICATIONS There are no known risks or complications of having an ultrasound. BEFORE THE PROCEDURE  If the ultrasound scan involves your upper abdomen, you may be directed not to eat, smoke, or chew gum the morning of your exam. Follow your health care provider's instructions.  During the test, a gel will be applied to your skin. Wear clothing that is easily washable in case the gel gets on your clothes. PROCEDURE  A gel will be applied to your skin. It may feel cool.  The transducer will be placed on the area to be examined.  Pictures will be taken. They will be displayed on one or more monitors that look like small television screens. AFTER THE PROCEDURE  You can safely drive home and  return to regular activities immediately after your exam.  Keep follow-up visits as directed by your health care provider.  Ask when your test results will be ready. It is your responsibility to get your test results.   This information is not intended to replace advice given to you by  your health care provider. Make sure you discuss any questions you have with your health care provider.   Document Released: 07/09/2004 Document Revised: 07/19/2014 Document Reviewed: 09/20/2013 Elsevier Interactive Patient Education [employer].    If you need a refill on your cardiac medications before your next appointment, please call your pharmacy. '

## 2016-04-14 ENCOUNTER — Ambulatory Visit (HOSPITAL_COMMUNITY)
Admission: RE | Admit: 2016-04-14 | Discharge: 2016-04-14 | Disposition: A | Discharge status: Home / Self Care | Payer: Medicare Other | Source: Ambulatory Visit | Attending: Cardiovascular Disease | Admitting: Cardiovascular Disease

## 2016-04-14 DIAGNOSIS — Z87891 Personal history of nicotine dependence: Secondary | ICD-10-CM | POA: Insufficient documentation

## 2016-04-14 DIAGNOSIS — Z951 Presence of aortocoronary bypass graft: Secondary | ICD-10-CM | POA: Insufficient documentation

## 2016-04-14 DIAGNOSIS — M79604 Pain in right leg: Secondary | ICD-10-CM | POA: Insufficient documentation

## 2016-04-14 DIAGNOSIS — I2510 Atherosclerotic heart disease of native coronary artery without angina pectoris: Secondary | ICD-10-CM | POA: Diagnosis not present

## 2016-04-14 DIAGNOSIS — M79605 Pain in left leg: Secondary | ICD-10-CM | POA: Diagnosis not present

## 2016-04-14 DIAGNOSIS — E785 Hyperlipidemia, unspecified: Secondary | ICD-10-CM | POA: Insufficient documentation

## 2016-04-14 DIAGNOSIS — I10 Essential (primary) hypertension: Secondary | ICD-10-CM | POA: Diagnosis not present

## 2016-04-16 ENCOUNTER — Other Ambulatory Visit (HOSPITAL_BASED_OUTPATIENT_CLINIC_OR_DEPARTMENT_OTHER): Payer: Medicare Other

## 2016-04-16 ENCOUNTER — Telehealth: Payer: Self-pay | Admitting: Cardiovascular Disease

## 2016-04-16 DIAGNOSIS — D649 Anemia, unspecified: Secondary | ICD-10-CM

## 2016-04-16 DIAGNOSIS — D500 Iron deficiency anemia secondary to blood loss (chronic): Secondary | ICD-10-CM

## 2016-04-16 DIAGNOSIS — C181 Malignant neoplasm of appendix: Secondary | ICD-10-CM

## 2016-04-16 LAB — CEA (IN HOUSE-CHCC): CEA (CHCC-In House): 2.83 ng/mL (ref 0.00–5.00)

## 2016-04-16 LAB — CBC WITH DIFFERENTIAL/PLATELET
BASO%: 0.4 % (ref 0.0–2.0)
Basophils Absolute: 0 10*3/uL (ref 0.0–0.1)
EOS%: 2.8 % (ref 0.0–7.0)
Eosinophils Absolute: 0.3 10*3/uL (ref 0.0–0.5)
HCT: 40 % (ref 34.8–46.6)
HGB: 12.7 g/dL (ref 11.6–15.9)
LYMPH%: 29.1 % (ref 14.0–49.7)
MCH: 26.6 pg (ref 25.1–34.0)
MCHC: 31.8 g/dL (ref 31.5–36.0)
MCV: 83.7 fL (ref 79.5–101.0)
MONO#: 0.9 10*3/uL (ref 0.1–0.9)
MONO%: 9.6 % (ref 0.0–14.0)
NEUT%: 58.1 % (ref 38.4–76.8)
NEUTROS ABS: 5.2 10*3/uL (ref 1.5–6.5)
PLATELETS: 301 10*3/uL (ref 145–400)
RBC: 4.77 10*6/uL (ref 3.70–5.45)
RDW: 27.5 % — ABNORMAL HIGH (ref 11.2–14.5)
WBC: 8.9 10*3/uL (ref 3.9–10.3)
lymph#: 2.6 10*3/uL (ref 0.9–3.3)

## 2016-04-16 LAB — COMPREHENSIVE METABOLIC PANEL
ALBUMIN: 3.5 g/dL (ref 3.5–5.0)
ALK PHOS: 58 U/L (ref 40–150)
ALT: 14 U/L (ref 0–55)
AST: 16 U/L (ref 5–34)
Anion Gap: 9 mEq/L (ref 3–11)
BUN: 16.5 mg/dL (ref 7.0–26.0)
CALCIUM: 9.3 mg/dL (ref 8.4–10.4)
CO2: 29 mEq/L (ref 22–29)
Chloride: 105 mEq/L (ref 98–109)
Creatinine: 0.8 mg/dL (ref 0.6–1.1)
EGFR: 71 mL/min/{1.73_m2} — AB (ref >90)
Glucose: 124 mg/dl (ref 70–140)
POTASSIUM: 3.8 meq/L (ref 3.5–5.1)
Sodium: 142 mEq/L (ref 136–145)
Total Bilirubin: <0.3 mg/dL (ref 0.20–1.20)
Total Protein: 6.7 g/dL (ref 6.4–8.3)

## 2016-04-16 LAB — IRON AND TIBC
%SAT: 13 % — ABNORMAL LOW (ref 21–57)
Iron: 43 ug/dL (ref 41–142)
TIBC: 340 ug/dL (ref 236–444)
UIBC: 297 ug/dL (ref 120–384)

## 2016-04-16 LAB — FERRITIN: FERRITIN: 22 ng/mL (ref 9–269)

## 2016-04-16 NOTE — Telephone Encounter (Signed)
New Message ° °Pt call requesting to speak with RN about lab results. Please call back to discuss  °

## 2016-04-16 NOTE — Telephone Encounter (Signed)
Notes Recorded by Eileen Stanford, PA-C on 04/08/2016 at 3:51 PM EDT Blood counts look a lot better. Lipids look good. Triglycerides are a little high and HDL a little low which can be improved with diet and exercise.   Discussed results with patient's daughter with permission.   MyChart sign-up instructions mailed per daughter's request.

## 2016-04-17 LAB — CEA: CEA: 3.8 ng/mL (ref 0.0–4.7)

## 2016-04-19 LAB — CHROMOGRANIN A: Chromogranin A: 9 nmol/L — ABNORMAL HIGH (ref 0–5)

## 2016-04-22 LAB — 5 HIAA, QUANTITATIVE, URINE, 24 HOUR
5-HIAA, Urine: 1.2 mg/L
5-HIAA,Quant.,24 Hr Urine: 2.2 mg/24 hr (ref 0.0–14.9)

## 2016-04-23 ENCOUNTER — Ambulatory Visit (HOSPITAL_BASED_OUTPATIENT_CLINIC_OR_DEPARTMENT_OTHER): Payer: Medicare Other | Admitting: Hematology

## 2016-04-23 ENCOUNTER — Telehealth: Payer: Self-pay | Admitting: Hematology

## 2016-04-23 VITALS — BP 136/69 | HR 92 | Temp 98.4°F | Resp 17 | Ht 65.0 in | Wt 198.0 lb

## 2016-04-23 DIAGNOSIS — C181 Malignant neoplasm of appendix: Secondary | ICD-10-CM

## 2016-04-23 DIAGNOSIS — D509 Iron deficiency anemia, unspecified: Secondary | ICD-10-CM | POA: Diagnosis not present

## 2016-04-23 DIAGNOSIS — C189 Malignant neoplasm of colon, unspecified: Secondary | ICD-10-CM

## 2016-04-23 NOTE — Progress Notes (Signed)
[place]  [phone] [phone]  Clinic Follow Up Note   Patient Care Team: Bernerd Limbo, MD as PCP - General (Family Medicine) Coralie Keens, MD as Consulting Physician (General Surgery) Gatha Mayer, MD as Consulting Physician (Gastroenterology) Josue Hector, MD as Consulting Physician (Cardiology) Tania Ade, RN as Registered Nurse 04/23/2016   CHIEF COMPLAINTS:  Follow up mixed carcinoids-adenocarcinoma of appendix  HISTORY OF PRESENTING ILLNESS (05/29/2015):  Rachel Conner 80 y.o. 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 She is accompanied by her son to the clinic today. She came in a wheelchair.  She presented with abdominal pain, intermittent nausea and vomiting, diarrhea, for a few months. She was seen at [facility] emergency room in September, and CT of abdomen and pelvis revealed dilated appendix. She was referred to general surgeon Dr. Belenda Cruise, who brought her to OR on 04/11/2015 for appendectomy. She tolerated surgery well. The surgical path reviewed mixed carcinoid and adenocarcinoma, with area of small perforation. She was brought back to the OR on 04/28/2015, and underwent right hemicolectomy.  She has been recovering well from her surgery. She still feels quite fatigued, she is able to do most ADLs, but not much other activities. She has physical therapist and occupational therapist coming to her house twice a week. Her appetite has improved, she is eating better. Her pain incision has been much improved.  CURRENT THERAPY: Observation  INTERIM HISTORY Ms. Suchodolski returns for follow-up. She is accompanied by her daughter-in-law to the clinic today. She is doing well overall. She has been quite stressed lately because of her daughter who has drug abuse issue, she takes Xanax for anxiety. Her husband passed away a few years ago, and [place]. She physically feels well, denies any pain, nausea, or abdominal discomfort. Her appetite and energy level are fair, she functions were wall.   MEDICAL HISTORY:  Past Medical History:  Diagnosis Date  . Anemia   . Anxiety   . Arthritis    Osteoarthrosis  . CAD (coronary artery disease) 1999   CABG  . Cancer [facility])    cancer of the appendix   . Cataract    rt eye per pt  . CHF (congestive heart failure) ([place])   . Colon polyps   . Confusion, postoperative   . DVT (deep venous thrombosis) ([place])   . GERD (gastroesophageal reflux disease)   . Hemorrhoids   . Hiatal hernia   . HLD (hyperlipidemia)   . HTN (hypertension)   . Hypothyroidism   . Major depressive disorder   . MI (myocardial infarction)   . Peripheral neuropathy ([place])     SURGICAL HISTORY: Past Surgical History:  Procedure Laterality Date  . CAROTID STENT  07/1998   stent RCA   . CHOLECYSTECTOMY  2011  . COLON SURGERY  03/2015   partial colon to remove cancer in appendix  . COLONOSCOPY W/ BIOPSIES    . CORONARY ARTERY BYPASS GRAFT  1999   6 vessel per pt.  . CORONARY STENT PLACEMENT  10/2001   circumflex stent  . ESOPHAGOGASTRODUODENOSCOPY    . LAPAROSCOPIC APPENDECTOMY N/A 04/11/2015   Procedure: LAPAROSCOPIC APPENDECTOMY;  Surgeon: Coralie Keens, MD;  Location: [place];  Service: General;  Laterality: N/A;  Laparoscopic converted to open appendectomy   . LUMBAR LAMINECTOMY     fusion 1971  . PARTIAL COLECTOMY N/A 04/28/2015   Procedure: Ileocecectomy;  Surgeon:  Coralie Keens, MD;  Location: [place];  Service: General;  Laterality: N/A;  . Right Thumb Right    Thumb Finger Release  . THYROID SURGERY      SOCIAL HISTORY: Social History   Social History  . Marital status: Married    Spouse name: N/A  . Number of children: 3  . Years of education: N/A   Occupational History  . retired    Social History Main Topics  . Smoking status: Former Smoker    Packs/day: 1.00    Years: 45.00     Quit date: 07/12/1997  . Smokeless tobacco: Never Used     Comment: quit smoking age 73  . Alcohol use No  . Drug use: No  . Sexual activity: Not on file   Other Topics Concern  . Not on file   Social History Narrative   Widowed, lived [place] and his wife will stay with her after release from facility   Retired from being EMT/med tech   Originally from [place]   Able to perform ADL's independently    FAMILY HISTORY: Family History  Problem Relation Age of Onset  . Heart disease Father   . Heart disease Mother   . Heart disease Brother   . Cancer Sister     non hodgkins lymphoma  . Heart disease Other     grandfather (side unknown)  . Colon cancer Neg Hx     ALLERGIES:  is allergic to rivaroxaban; niacin; nitrogen; nitroglycerin; myrbetriq [mirabegron]; and niaspan [niacin er].  MEDICATIONS:  Current Outpatient Prescriptions  Medication Sig Dispense Refill  . ALPRAZolam (XANAX) 0.25 MG tablet Take 0.25 mg by mouth at bedtime as needed for anxiety.    Marland Kitchen amLODipine (NORVASC) 5 MG tablet Take 1 tablet (5 mg total) by mouth daily. 90 tablet 3  . aspirin EC 81 MG tablet Take 81 mg by mouth.    Marland Kitchen atorvastatin (LIPITOR) 80 MG tablet Take 1 tablet (80 mg total) by mouth daily. 90 tablet 3  . DULoxetine (CYMBALTA) 20 MG capsule Take 20 mg by mouth daily.    Marland Kitchen esomeprazole (NEXIUM) 40 MG capsule Take 40 mg by mouth daily.     . fluticasone (FLONASE) 50 MCG/ACT nasal spray Place 1 spray into both nostrils daily as needed for allergies.     Marland Kitchen levothyroxine (SYNTHROID, LEVOTHROID) 100 MCG tablet Take 100 mcg by mouth daily.      Marland Kitchen loratadine (CLARITIN) 10 MG tablet Take 10 mg by mouth daily.    Marland Kitchen losartan-hydrochlorothiazide (HYZAAR) 50-12.5 MG tablet Take 1 tablet by mouth daily. 90 tablet 2  . mirtazapine (REMERON) 7.5 MG tablet TAKE 1 TABLET(7.5 MG) BY MOUTH AT BEDTIME 90 tablet 1  . ondansetron (ZOFRAN) 8 MG tablet Take by mouth every 8 (eight) hours as needed for nausea or  vomiting.    . prochlorperazine (COMPAZINE) 10 MG tablet Take 10 mg by mouth every 6 (six) hours as needed for nausea or vomiting.    . rivaroxaban (XARELTO) 15 MG TABS tablet Take 1 tablet (15 mg total) by mouth daily with supper. 90 tablet 3   Current Facility-Administered Medications  Medication Dose Route Frequency Provider Last Rate Last Dose  . 0.9 %  sodium chloride infusion  500 mL Intravenous Continuous Gatha Mayer, MD        REVIEW OF SYSTEMS:   Constitutional: Denies fevers, chills or abnormal night sweats Eyes: Denies blurriness of vision, double vision or watery eyes Ears, nose, mouth, throat,  and face: Denies mucositis or sore throat Respiratory: Denies cough, dyspnea or wheezes Cardiovascular: Denies palpitation, chest discomfort or lower extremity swelling Gastrointestinal:  Denies nausea, heartburn or change in bowel habits Skin: Denies abnormal skin rashes Lymphatics: Denies new lymphadenopathy or easy bruising Neurological:Denies numbness, tingling or new weaknesses Behavioral/Psych: Mood is stable, no new changes  All other systems were reviewed with the patient and are negative.  PHYSICAL EXAMINATION: ECOG PERFORMANCE STATUS: 1  Vitals:   04/23/16 1145  BP: 136/69  Pulse: 92  Resp: 17  Temp: 98.4 F (36.9 C)   Filed Weights   04/23/16 1145  Weight: 198 lb (89.8 kg)    GENERAL:alert, no distress and comfortable SKIN: skin color, texture, turgor are normal, no rashes or significant lesions EYES: normal, conjunctiva are pink and non-injected, sclera clear OROPHARYNX:no exudate, no erythema and lips, buccal mucosa, and tongue normal  NECK: supple, thyroid normal size, non-tender, without nodularity LYMPH:  no palpable lymphadenopathy in the cervical, axillary or inguinal LUNGS: clear to auscultation and percussion with normal breathing effort HEART: regular rate & rhythm and no murmurs and no lower extremity edema ABDOMEN:abdomen soft, non-tender and  normal bowel sounds. (+) surgical incision sites are healing well. Musculoskeletal:no cyanosis of digits and no clubbing  PSYCH: alert & oriented x 3 with fluent speech NEURO: no focal motor/sensory deficits  LABORATORY DATA:  I have reviewed the data as listed CBC Latest Ref Rng & Units 04/16/2016 04/07/2016 03/11/2016  WBC 3.9 - 10.3 10e3/uL 8.9 9.7 11.0(H)  Hemoglobin 11.6 - 15.9 g/dL 12.7 12.6 10.3(L)  Hematocrit 34.8 - 46.6 % 40.0 40.8 32.2(L)  Platelets 145 - 400 10e3/uL 301 317 432.0(H)    CMP Latest Ref Rng & Units 04/16/2016 12/11/2015 09/17/2015  Glucose 70 - 140 mg/dl 124 100 101  BUN 7.0 - 26.0 mg/dL 16.5 17.6 12.5  Creatinine 0.6 - 1.1 mg/dL 0.8 0.8 0.8  Sodium 136 - 145 mEq/L 142 139 142  Potassium 3.5 - 5.1 mEq/L 3.8 3.9 3.7  Chloride 101 - 111 mmol/L - - -  CO2 22 - 29 mEq/L 29 29 28   Calcium 8.4 - 10.4 mg/dL 9.3 9.4 9.8  Total Protein 6.4 - 8.3 g/dL 6.7 6.9 7.1  Total Bilirubin 0.20 - 1.20 mg/dL <0.30 0.40 0.42  Alkaline Phos 40 - 150 U/L 58 55 50  AST 5 - 34 U/L 16 13 17   ALT 0 - 55 U/L 14 10 13    Chromogranin A  Order: [id number]  Status:  Final result Visible to patient:  Yes (MyChart) Next appt:  Today at 01:00 PM in Oncology Burr Medico, Krista Blue, MD) Dx:  Cancer of appendix [facility])    Ref Range & Units 7d ago 12mo ago 39mo ago   Chromogranin A 0 - 5 nmol/L 9   26CM   6CM          Results for ILIZA, GARCIA (MRN [id number]) as of 04/23/2016 06:49  Ref. Range 06/12/2015 08:18 09/17/2015 08:31 12/11/2015 11:05 04/16/2016 11:30  CEA Latest Units: ng/mL 1.2 2.3 3.0 (H)   CEA Latest Ref Range: 0.0 - 4.7 ng/mL  3.6 4.1 3.8  CEA ([facility]In [facility]) Latest Ref Range: 0.00 - 5.00 ng/mL    2.83  Chromogranin A Latest Ref Range: <=15 ng/mL  50 (H) 140 (H)    Results for LAELIA, OTTOSON (MRN [id number]) as of 04/23/2016 06:49  Ref. Range 06/19/2015 09:30 03/11/2016 15:26 04/16/2016 11:30  Iron Latest Ref Range: 41 - 142 ug/dL 23 (L)  43  UIBC Latest Ref Range: 120 - 384 ug/dL 345  297    TIBC Latest Ref Range: 236 - 444 ug/dL 368  340  %SAT Latest Ref Range: 21 - 57 % 6 (L)  13 (L)  Ferritin Latest Ref Range: 9 - 269 ng/ml 20 8.6 (L) 22   5 HIAA, quantitative, urine, 24 hour  Order: [id number]  Status:  Final result Visible to patient:  No (Not Released) Next appt:  05/24/2016 at 11:00 AM in Gastroenterology Silvano Rusk, MD) Dx:  Cancer of appendix [facility])    Ref Range & Units 8d ago 1mo ago 52mo ago   5-HIAA, Urine Undefined mg/L 1.2  2.5CM  1.3CM   Comments: Total Volume: 1800 mL   5-HIAA,Quant.,24 Hr Urine 0.0 - 14.9 mg/24 hr 2.2  4.6CM  3.0CM         PATHOLOGY REPORT  Diagnosis 04/28/2015 Colon, segmental resection for tumor, Distal illium and cecum - MIXED GOBLET CELL CARCINOID/CARCINOMA-ADENOCARCINOMA, SEE COMMENT. - TUMOR INVOLVES APPENDICEAL OS. - TUMOR INVOLVES FIBROFATTY SOFT TISSUE. AROUND THE APPENDICEAL OS - ONE LYMPH NODE, POSITIVE FOR METASTATIC TUMOR (1/5). - SURGICAL MARGINS, NEGATIVE FOR TUMOR. Microscopic Comment The previous appendectomy demonstrating mixed goblet cell carcinoid/carcinoma-adenocarcinoma is noted [id number]). Please see previous case for additional tumor information. (CRR:ecj 04/29/2015)  Diagnosis 04/11/2015 Appendix, Other than Incidental - MIXED GOBLET CELL CARCINOID-ADENOCARCINOMA, GRADE 3, SPANNING 4.5 CM. - TUMOR INVADES THROUGH SEROSA. - PERINEURAL INVASION PRESENT. - RESECTION MARGIN IS POSITIVE. - SEE ONCOLOGY TABLE. Microscopic Comment APPENDIX: Specimen: Appendix. Procedure: Appendectomy. Specimen Integrity: Disrupted.  Specimen Size: 4.5 x 3.0 x 1.9 cm. Tumor Site: Diffuse involving appendix. Tumor Size: Approximately 4.5 cm. Histologic Type: Mixed goblet cell carcinoma-adenocarcinoma. Histologic Grade: Grade 3 (poorly differentiated) Microscopic Tumor Extension: Through serosa. Margins: Proximal Margin: Involved. Mesenteric Margin: N/A. Adenoma present at proximal margin: No. Lymph-Vascular Invasion:  Not identified. Perineural Invasion: Present. Peritumoral Nodules (tumor deposits): Not identified. Lymph nodes: number examined 0; number positive: 0 TNM: pT4, pNX Ancillary studies: Can be performed upon request. Comments: The tumor consists of infiltrating nests and single goblet cells. Additionally there are areas of conventional gland forming adenocarcinoma. Immunohistochemistry reveals the goblet cells are positive for synaptophysin, chromogranin, and CD56. The tumor diffusely involves the appendix from the tip to the resection margin and is thus estimated at 4.5 cm in size. There is an area of possible perforation, although this is difficult to assess with the disrupted nature of the specimen. Tumor invades through the serosa. Dr. Donato Heinz has reviewed the case. The case was called to Dr. Ninfa Linden on 04/15/2015.   RADIOGRAPHIC STUDIES: I have personally reviewed the radiological images as listed and agreed with the findings in the report. No results found.  ASSESSMENT & PLAN:  80 yo Caucasian female  1. Mixed goblet cell carcinoid-adenocarcinoma of appendix, [id number], at least stage IIIB -I previously reviewed her surgical pathology results with her in great details. The stage was also discussed with her. -we discussed that appendiceal mixed carcinoid and adenocarcinoma is rare type tumor, and we do not have large body of clinical trial data to guild [place] about the adjuvant chemotherapy and surveillance. -due to the component of adenocarcinoma, and advanced stage, high risk features including a T4 disease, positive nodes, and possible appendiceal perforation, I do think her risk of recurrence is high. -Patient declined adjuvant chemotherapy.  -She is clinically doing well, exam unremarkable. I reviewed her lab test results from week ago, which showed normal CBC, CMP, CEA, and a  24-hour urine HIAA. Her chromogranin A level is slightly above normal, but lower than last time, I do not think  she has evidence of recurrence. -Her recent screening colonoscopy in 02/2016 was negative -next surveillance CT in 12/2016  2. Iron deficient anemia secondary to blood loss  -Resolved and now -Her colonoscopy was negative -Continue oral iron  3. HTN, hypothyroidism, depression, CHF -She'll continue follow-up with her primary care physician  Plan -RTC in 4 months with lab one week before  -will order surveillance CT scan on next visit   All questions were answered. The patient knows to call the clinic with any problems, questions or concerns. I spent 25 minutes counseling the patient face to face. The total time spent in the appointment was 30 minutes and more than 50% was on counseling.     Truitt Merle, MD 04/23/2016

## 2016-04-23 NOTE — Telephone Encounter (Signed)
Avs report and appointment schedule given to patient, per 04/23/16 los. °

## 2016-04-24 ENCOUNTER — Encounter: Payer: Self-pay | Admitting: Hematology

## 2016-05-07 ENCOUNTER — Encounter: Payer: Self-pay | Admitting: Physician Assistant

## 2016-05-24 ENCOUNTER — Ambulatory Visit: Payer: Medicare Other | Admitting: Internal Medicine

## 2016-06-08 ENCOUNTER — Encounter: Payer: Self-pay | Admitting: Nurse Practitioner

## 2016-06-09 ENCOUNTER — Ambulatory Visit (INDEPENDENT_AMBULATORY_CARE_PROVIDER_SITE_OTHER): Payer: Medicare Other | Admitting: Nurse Practitioner

## 2016-06-09 ENCOUNTER — Encounter: Payer: Self-pay | Admitting: Nurse Practitioner

## 2016-06-09 ENCOUNTER — Ambulatory Visit: Payer: Medicare Other | Admitting: Physician Assistant

## 2016-06-09 VITALS — BP 130/64 | HR 88 | Ht 65.0 in | Wt 192.0 lb

## 2016-06-09 DIAGNOSIS — I2583 Coronary atherosclerosis due to lipid rich plaque: Secondary | ICD-10-CM

## 2016-06-09 DIAGNOSIS — I2510 Atherosclerotic heart disease of native coronary artery without angina pectoris: Secondary | ICD-10-CM

## 2016-06-09 DIAGNOSIS — R6889 Other general symptoms and signs: Secondary | ICD-10-CM | POA: Diagnosis not present

## 2016-06-09 MED ORDER — ALPRAZOLAM 0.25 MG PO TABS: 0.2500 mg | ORAL_TABLET | Freq: Two times a day (BID) | ORAL | 2 refills | Status: DC | PRN

## 2016-06-09 NOTE — Progress Notes (Signed)
CARDIOLOGY OFFICE NOTE  Date:  06/09/2016    [name] Date of Birth: [birth year] Medical Record [id number]  PCP:  Phineas Inches, MD  Cardiologist:  Johnsie Cancel    Chief Complaint  Patient presents with  . Coronary Artery Disease    Follow up visit - seen for Dr. Johnsie Cancel    History of Present Illness: Rachel Conner is a 80 y.o. female who presents today for a follow up visit. Seen for Dr. Johnsie Cancel.   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    She has a history of CAD with bypass surgery in 1999. She subsequently had stenting of the native RCA and has an occluded vein graft to the right. She also has a stent in the native obtuse marginal branch. Her last heart catheterization was in 2003. She was diagnosed with an extensive lower extremity DVT in 2014. She was treated with Coumadin for some time but then switched to Xarelto 15 mg daily. She's had issues with memory and seen at [facility] where she was diagnosed with possible vascular dementia. Her heart rate was noted to be low and Lopressor was stopped.  She complained of chest pain and had a preop Myoview done in 09/2014 which showed no ischemia or infarct EF 66%.   In 03/2015 she presented with abdominal pain, intermittent nausea and vomiting, diarrhea. She was seen at [facility] emergency room in September, and CT of abdomen and pelvis revealed dilated appendix. She was referred to general surgeon Dr. Belenda Cruise, who brought her to OR on 04/11/2015 for appendectomy. She tolerated surgery well. The surgical path reviewed mixed carcinoid and adenocarcinoma, with area of small perforation. She was brought back to the OR on 04/28/2015, and underwent right hemicolectomy. She is followed by oncology - noted that her risk of recurrence is high - she has declined adjuvant chemotherapy.    Seen back in September by Bonney Leitz, PA for evaluation of dizziness when standing. She had lost significant amounts of weight due to depression (her daughter is a drug addict). She had lost 40 lbs over the last 6 months. She was orthostatic. Norvasc was cut back.   Comes in today. Here alone today. Multiple complaints. Asking for Xanax refill - Dr Johnsie Cancel gives. Asking me for her GI medicines. Seems a little confused. Says dermatology tested her for PAD. Her legs are burning. She tires easily. Has to lie down and rest a lot. Tells me she is not dizzy anymore. She has a cough. Says her legs are swelling but weight is down. Lots of GI issues - has had N, V, and diarrhea. Tells me she is leaving [place]. Says she is going to go to [place]. She told my CMA that her daughter was switching her medicines and had her smoking crack?? Says now her daughter does not know where she is.   Past Medical History:  Diagnosis Date  . Anemia   . Anxiety   . Arthritis    Osteoarthrosis  . CAD (coronary artery disease) 1999   CABG  . Cancer [facility])    cancer of the appendix   . Cataract    rt eye per pt  . CHF (congestive heart failure) ([place])   . Colon polyps   . Confusion, postoperative   . DVT (deep venous thrombosis) ([place])   . GERD (  gastroesophageal reflux disease)   . Hemorrhoids   . Hiatal hernia   . HLD (hyperlipidemia)   . HTN (hypertension)   . Hypothyroidism   . Major depressive disorder   . MI (myocardial infarction)   . Peripheral neuropathy [facility])     Past Surgical History:  Procedure Laterality Date  . CAROTID STENT  07/1998   stent RCA   . CHOLECYSTECTOMY  2011  . COLON SURGERY  03/2015   partial colon to remove cancer in appendix  . COLONOSCOPY W/ BIOPSIES    . CORONARY ARTERY BYPASS GRAFT  1999   6 vessel per pt.  . CORONARY STENT PLACEMENT  10/2001   circumflex stent  . ESOPHAGOGASTRODUODENOSCOPY    . LAPAROSCOPIC APPENDECTOMY N/A 04/11/2015   Procedure: LAPAROSCOPIC  APPENDECTOMY;  Surgeon: Coralie Keens, MD;  Location: [place];  Service: General;  Laterality: N/A;  Laparoscopic converted to open appendectomy   . LUMBAR LAMINECTOMY     fusion 1971  . PARTIAL COLECTOMY N/A 04/28/2015   Procedure: Ileocecectomy;  Surgeon: Coralie Keens, MD;  Location: [place];  Service: General;  Laterality: N/A;  . Right Thumb Right    Thumb Finger Release  . THYROID SURGERY       Medications: Current Outpatient Prescriptions  Medication Sig Dispense Refill  . ALPRAZolam (XANAX) 0.25 MG tablet Take 1 tablet (0.25 mg total) by mouth 2 (two) times daily as needed for anxiety. 60 tablet 2  . amLODipine (NORVASC) 5 MG tablet Take 1 tablet (5 mg total) by mouth daily. 90 tablet 3  . aspirin EC 81 MG tablet Take 81 mg by mouth.    Marland Kitchen atorvastatin (LIPITOR) 80 MG tablet Take 1 tablet (80 mg total) by mouth daily. 90 tablet 3  . DULoxetine (CYMBALTA) 20 MG capsule Take 20 mg by mouth daily.    Marland Kitchen esomeprazole (NEXIUM) 40 MG capsule Take 40 mg by mouth daily.     . ferrous sulfate 325 (65 FE) MG tablet Take 325 mg by mouth 2 (two) times daily with a meal.    . fluticasone (FLONASE) 50 MCG/ACT nasal spray Place 1 spray into both nostrils daily as needed for allergies.     Marland Kitchen levothyroxine (SYNTHROID, LEVOTHROID) 100 MCG tablet Take 100 mcg by mouth daily.      Marland Kitchen loratadine (CLARITIN) 10 MG tablet Take 10 mg by mouth daily.    Marland Kitchen losartan-hydrochlorothiazide (HYZAAR) 50-12.5 MG tablet Take 1 tablet by mouth daily. 90 tablet 2  . mirtazapine (REMERON) 7.5 MG tablet TAKE 1 TABLET(7.5 MG) BY MOUTH AT BEDTIME 90 tablet 1  . ondansetron (ZOFRAN) 8 MG tablet Take by mouth every 8 (eight) hours as needed for nausea or vomiting.    . prochlorperazine (COMPAZINE) 10 MG tablet Take 10 mg by mouth every 6 (six) hours as needed for nausea or vomiting.    . rivaroxaban (XARELTO) 15 MG TABS tablet Take 1 tablet (15 mg total) by mouth daily with supper. 90 tablet 3   Current  Facility-Administered Medications  Medication Dose Route Frequency Provider Last Rate Last Dose  . 0.9 %  sodium chloride infusion  500 mL Intravenous Continuous Gatha Mayer, MD        Allergies: Allergies  Allergen Reactions  . Rivaroxaban Other (See Comments)    Patient denies  . Niacin Other (See Comments)    Severe flushing  . Nitrogen Other (See Comments)    Hypotension  . Nitroglycerin Other (See Comments)    Hypotension  .  Myrbetriq [Mirabegron] Other (See Comments)    Gross hematuria  . Niaspan [Niacin Er] Other (See Comments)    Severe flushing    Social History: The patient  reports that she quit smoking about 18 years ago. She has a 45.00 pack-year smoking history. She has never used smokeless tobacco. She reports that she does not drink alcohol or use drugs.   Family History: The patient's family history includes Cancer in her sister; Heart disease in her brother, father, mother, and other.   Review of Systems: Please see the history of present illness.   Otherwise, the review of systems is positive for none.   All other systems are reviewed and negative.   Physical Exam: VS:  BP 130/64   Pulse 88   Ht 5\' 5"  (1.651 m)   Wt 192 lb (87.1 kg)   BMI 31.95 kg/m  .  BMI Body mass index is 31.95 kg/m.  Wt Readings from Last 3 Encounters:  06/09/16 192 lb (87.1 kg)  04/23/16 198 lb (89.8 kg)  04/07/16 190 lb 9.6 oz (86.5 kg)    General: Very tangential in thoughts. Weight is down 6 pounds. She is alert and in no acute distress.   HEENT: Normal.  Neck: Supple, no JVD, carotid bruits, or masses noted.  Cardiac: Regular rate and rhythm. No murmurs, rubs, or gallops. No edema.  Respiratory:  Lungs are clear to auscultation bilaterally with normal work of breathing.  GI: Soft and nontender.  MS: No deformity or atrophy. Gait and ROM intact.  Skin: Warm and dry. Color is normal.  Neuro:  Strength and sensation are intact and no gross focal deficits noted.    Psych: Alert, appropriate and with normal affect.   LABORATORY DATA:  EKG:  EKG is not ordered today.  Lab Results  Component Value Date   WBC 8.9 04/16/2016   HGB 12.7 04/16/2016   HCT 40.0 04/16/2016   PLT 301 04/16/2016   GLUCOSE 124 04/16/2016   CHOL 144 04/07/2016   TRIG 199 (H) 04/07/2016   HDL 43 (L) 04/07/2016   LDLCALC 61 04/07/2016   ALT 14 04/16/2016   AST 16 04/16/2016   NA 142 04/16/2016   K 3.8 04/16/2016   CL 97 (L) 05/09/2015   CREATININE 0.8 04/16/2016   BUN 16.5 04/16/2016   CO2 29 04/16/2016   TSH 1.24 11/19/2011   INR 1.02 04/28/2015   HGBA1C 6.4 (H) 09/11/2012    BNP (last 3 results) No results for input(s): BNP in the last 8760 hours.  ProBNP (last 3 results) No results for input(s): PROBNP in the last 8760 hours.   Other Studies Reviewed Today:  Notes Recorded by Josue Hector, MD on 09/12/2014 at 5:16 PM Normal myovue study with no evidence of ischemia or infarction     Assessment/Plan:  Dizziness with standing: this has resolved with cutting back Norvasc. I have left her on her current regimen.   HLD: continue statin. Lipids from September noted.   CAD s/p CABG and PCI: low risk myoview in 09/2014. No chest pain reported.   Appendiceal cancer s/p appendectomy and hemicolectomy (04/2015): followed closely by Dr. Burr Medico  Hx of DVT: continue on Xarelto - recent CBC ok. No bleeding noted.   Multiple GI complaints and multiple somatic complaints - I think her cardiac status is ok. BP is ok. Recent labs stable. I would defer to PCP. Dr. Johnsie Cancel has given her her refill for her Xanax.   Current medicines are reviewed  with the patient today.  The patient does not have concerns regarding medicines other than what has been noted above.  The following changes have been made:  See above.  Labs/ tests ordered today include:   No orders of the defined types were placed in this encounter.    Disposition:   FU with Dr. Johnsie Cancel in about  4 months.   Patient is agreeable to this plan and will call if any problems develop in the interim.   Signed: Burtis Junes, RN, ANP-C 06/09/2016 4:20 PM  [facility] [address] [place], [place]  [address] Phone: [phone] Fax: [phone]

## 2016-06-09 NOTE — Patient Instructions (Signed)
We will be checking the following labs today - NONE   Medication Instructions:    Continue with your current medicines.     Testing/Procedures To Be Arranged:  N/A  Follow-Up:   See Dr. Johnsie Cancel in 3 to 4 months   Other Special Instructions:   Try to be as active as you can    If you need a refill on your cardiac medications before your next appointment, please call your pharmacy.   Call the [place] office at [phone] if you have any questions, problems or concerns.

## 2016-07-01 ENCOUNTER — Other Ambulatory Visit: Payer: Self-pay | Admitting: Cardiovascular Disease

## 2016-07-21 ENCOUNTER — Telehealth: Payer: Self-pay | Admitting: Cardiovascular Disease

## 2016-07-21 DIAGNOSIS — W19XXXA Unspecified fall, initial encounter: Secondary | ICD-10-CM

## 2016-07-21 DIAGNOSIS — Y92009 Unspecified place in unspecified non-institutional (private) residence as the place of occurrence of the external cause: Principal | ICD-10-CM

## 2016-07-21 DIAGNOSIS — S0990XA Unspecified injury of head, initial encounter: Secondary | ICD-10-CM

## 2016-07-21 NOTE — Telephone Encounter (Signed)
New Message  Pt c/o medication issue:  1. Name of Medication: Nexium   2. How are you currently taking this medication (dosage and times per day)? 40mg   3. Are you having a reaction (difficulty breathing--STAT)? no  4. What is your medication issue? Per pt would like to speak with RN about getting an alternate medication. Pt states medication is $49.00. Please call back to disucss

## 2016-07-21 NOTE — Telephone Encounter (Signed)
Called patient about her message. Patient stated that she had fallen today and hit her head and hand. Patient is on xarelto, so instructed patient to have her head checked out as soon as possible. Dr. Johnsie Cancel advised the patient to have a head CT. Ordered CT for patient to have before her GI appointment tomorrow with Dr. Jerl Santos. Informed patient that Nexium is sold over the counter. Encouraged patient to talk with Dr. Jerl Santos about her medication for GERD. Patient verbalized understanding.

## 2016-07-22 ENCOUNTER — Encounter: Payer: Self-pay | Admitting: Internal Medicine

## 2016-07-22 ENCOUNTER — Ambulatory Visit (INDEPENDENT_AMBULATORY_CARE_PROVIDER_SITE_OTHER)
Admission: RE | Admit: 2016-07-22 | Discharge: 2016-07-22 | Disposition: A | Discharge status: Home / Self Care | Payer: Medicare Other | Source: Ambulatory Visit | Attending: Internal Medicine | Admitting: Internal Medicine

## 2016-07-22 ENCOUNTER — Ambulatory Visit (INDEPENDENT_AMBULATORY_CARE_PROVIDER_SITE_OTHER)
Admission: RE | Admit: 2016-07-22 | Discharge: 2016-07-22 | Disposition: A | Discharge status: Home / Self Care | Payer: Medicare Other | Source: Ambulatory Visit | Attending: Cardiovascular Disease | Admitting: Cardiovascular Disease

## 2016-07-22 ENCOUNTER — Ambulatory Visit (INDEPENDENT_AMBULATORY_CARE_PROVIDER_SITE_OTHER): Payer: Medicare Other | Admitting: Internal Medicine

## 2016-07-22 ENCOUNTER — Other Ambulatory Visit (INDEPENDENT_AMBULATORY_CARE_PROVIDER_SITE_OTHER): Payer: Medicare Other

## 2016-07-22 VITALS — BP 94/50 | HR 88 | Ht 64.0 in | Wt 189.5 lb

## 2016-07-22 DIAGNOSIS — S0990XA Unspecified injury of head, initial encounter: Secondary | ICD-10-CM | POA: Diagnosis not present

## 2016-07-22 DIAGNOSIS — I952 Hypotension due to drugs: Secondary | ICD-10-CM | POA: Diagnosis not present

## 2016-07-22 DIAGNOSIS — S6992XA Unspecified injury of left wrist, hand and finger(s), initial encounter: Secondary | ICD-10-CM

## 2016-07-22 DIAGNOSIS — D508 Other iron deficiency anemias: Secondary | ICD-10-CM

## 2016-07-22 DIAGNOSIS — K219 Gastro-esophageal reflux disease without esophagitis: Secondary | ICD-10-CM

## 2016-07-22 DIAGNOSIS — D509 Iron deficiency anemia, unspecified: Secondary | ICD-10-CM

## 2016-07-22 LAB — CBC WITH DIFFERENTIAL/PLATELET
BASOS ABS: 0 10*3/uL (ref 0.0–0.1)
Basophils Relative: 0.3 % (ref 0.0–3.0)
EOS ABS: 0.2 10*3/uL (ref 0.0–0.7)
Eosinophils Relative: 1.6 % (ref 0.0–5.0)
HCT: 41.1 % (ref 36.0–46.0)
Hemoglobin: 13.7 g/dL (ref 12.0–15.0)
LYMPHS ABS: 2.8 10*3/uL (ref 0.7–4.0)
Lymphocytes Relative: 27.3 % (ref 12.0–46.0)
MCHC: 33.4 g/dL (ref 30.0–36.0)
MCV: 90 fl (ref 78.0–100.0)
MONO ABS: 0.8 10*3/uL (ref 0.1–1.0)
MONOS PCT: 7.3 % (ref 3.0–12.0)
NEUTROS ABS: 6.6 10*3/uL (ref 1.4–7.7)
NEUTROS PCT: 63.5 % (ref 43.0–77.0)
PLATELETS: 300 10*3/uL (ref 150.0–400.0)
RBC: 4.57 Mil/uL (ref 3.87–5.11)
RDW: 14.5 % (ref 11.5–15.5)
WBC: 10.4 10*3/uL (ref 4.0–10.5)

## 2016-07-22 LAB — FERRITIN: FERRITIN: 26.2 ng/mL (ref 10.0–291.0)

## 2016-07-22 MED ORDER — ONDANSETRON HCL 8 MG PO TABS: 8.0000 mg | ORAL_TABLET | Freq: Three times a day (TID) | ORAL | 3 refills | Status: DC | PRN

## 2016-07-22 MED ORDER — ESOMEPRAZOLE MAGNESIUM 40 MG PO CPDR: 40.0000 mg | DELAYED_RELEASE_CAPSULE | Freq: Every day | ORAL | 3 refills | Status: DC

## 2016-07-22 NOTE — Progress Notes (Signed)
   Rachel Conner 81 y.o. Oct 06, 1935 [id number]  Assessment & Plan:   Encounter Diagnoses  Name Primary?  . Iron deficiency anemia, unspecified iron deficiency anemia type Yes  . Hypotension due to drugs   . Hand injury, left, initial encounter   . Gastroesophageal reflux disease, esophagitis presence not specified    1) Recheck CBC, Ferritin now 2) Hold amlodipine - recheck BP at home - ask Dr. Johnsie Cancel for further advice - ? Relationship to fall 3) Xray hand 4) refill Nexium and ondansetron  F/u Dr. Coletta Memos re: ? Increase dose of duloxetine  [id number], MD Dr. Truitt Merle, Dr. Jenkins Rouge  Subjective:   Chief Complaint: f/u anemia, weak  HPI Light-headed when standing Concerned cancer is back Out of Nexium x few weeks and nauseous since then Also out of ondansetron Living on her own in [place] - rather than [place] Feels depressed - lost husband 3 yrs ago, noone around though daughter and son help they are not close (in between them) Was seen in Fall re: orthostasis and amlodipine decreased to 5 from 10 mg  [place] the yesterday - struck head - Dr. Johnsie Cancel did head CT after phone call and that was ok - no bleed/trauma (she is on Xarelto)  Also has a swollen and painful left hand - since fall.  Is on duloxetine - "hasn't helped" Was weaned off Effexor and transitioned - has not had dose escalation - was used for neuropathic sxs it seems and probably also for depression  Medications, allergies, past medical history, past surgical history, family history and social history are reviewed and updated in the EMR.  Review of Systems As above  Objective:   Physical Exam BP (!) 94/50 (BP Location: Left Arm, Patient Position: Sitting, Cuff Size: Normal)   Pulse 88   Ht 5\' 4"  (1.626 m) Comment: height measured without shoes  Wt 189 lb 8 oz (86 kg)   BMI 32.53 kg/m   Repeat BP same - sitting Eyes are anicteric Left hand - mildly swollen and slightly discolored  - bruised and tender over mid metacarpals - able to make a fist, warm, seems intact neurovascularly Bridge of nose w/ mild ecchymosis  She appears alert and oriented x 3 but is somewhat flat affect   Data reviewed: negative EGD and colonoscopy 01/2016 Lab Results  Component Value Date   FERRITIN 22 04/16/2016   CBC Latest Ref Rng & Units 04/16/2016 04/07/2016 03/11/2016  WBC 3.9 - 10.3 10e3/uL 8.9 9.7 11.0(H)  Hemoglobin 11.6 - 15.9 g/dL 12.7 12.6 10.3(L)  Hematocrit 34.8 - 46.6 % 40.0 40.8 32.2(L)  Platelets 145 - 400 10e3/uL 301 317 432.0(H)   CT head no bleed,  Chronic atrophy/ischemia

## 2016-07-22 NOTE — Progress Notes (Signed)
Xray ok - no fracture Iron level stable and ok and so is hemoglobin My chart note

## 2016-07-22 NOTE — Patient Instructions (Addendum)
   Your physician has requested that you go to the basement for the lab work before leaving today.    We have sent the following medications to your pharmacy for you to pick up at your convenience: Nexium , generic zofran    Today go to the basement and get a hand x-ray before leaving.    DON'T TAKE YOUR AMLODIPINE TONIGHT, FOLLOW UP WITH DR Johnsie Cancel ABOUT THIS.    I appreciate the opportunity to care for you. Silvano Rusk, MD, [facility]

## 2016-07-27 ENCOUNTER — Telehealth: Payer: Self-pay

## 2016-07-27 NOTE — Telephone Encounter (Signed)
  Did a prior authorization for Nexium by filling out a form and faxing it back to [phone].  Patient has tried/failed omeprazole, pantoprazole and rabeprazole.  Nexium has been approved thru 07/11/2098.  Confirmation of this approval was sent to be scanned into epic.

## 2016-07-30 ENCOUNTER — Telehealth: Payer: Self-pay | Admitting: Hematology

## 2016-07-30 ENCOUNTER — Other Ambulatory Visit: Payer: Medicare Other

## 2016-07-30 NOTE — Telephone Encounter (Signed)
Pt dtr called to r/s lab appt today to 1/22 due icy road conditions. Gave pt new appt date/time

## 2016-08-02 ENCOUNTER — Other Ambulatory Visit (HOSPITAL_BASED_OUTPATIENT_CLINIC_OR_DEPARTMENT_OTHER): Payer: Medicare Other

## 2016-08-02 DIAGNOSIS — C181 Malignant neoplasm of appendix: Secondary | ICD-10-CM | POA: Diagnosis not present

## 2016-08-02 LAB — CBC WITH DIFFERENTIAL/PLATELET
BASO%: 0.2 % (ref 0.0–2.0)
Basophils Absolute: 0 10*3/uL (ref 0.0–0.1)
EOS ABS: 0.3 10*3/uL (ref 0.0–0.5)
EOS%: 2.9 % (ref 0.0–7.0)
HCT: 42.4 % (ref 34.8–46.6)
HGB: 13.9 g/dL (ref 11.6–15.9)
LYMPH%: 23.9 % (ref 14.0–49.7)
MCH: 30.2 pg (ref 25.1–34.0)
MCHC: 32.8 g/dL (ref 31.5–36.0)
MCV: 92 fL (ref 79.5–101.0)
MONO#: 0.7 10*3/uL (ref 0.1–0.9)
MONO%: 8.2 % (ref 0.0–14.0)
NEUT%: 64.8 % (ref 38.4–76.8)
NEUTROS ABS: 5.6 10*3/uL (ref 1.5–6.5)
PLATELETS: 298 10*3/uL (ref 145–400)
RBC: 4.61 10*6/uL (ref 3.70–5.45)
RDW: 14.4 % (ref 11.2–14.5)
WBC: 8.7 10*3/uL (ref 3.9–10.3)
lymph#: 2.1 10*3/uL (ref 0.9–3.3)

## 2016-08-02 LAB — COMPREHENSIVE METABOLIC PANEL WITH GFR
ALT: 16 U/L (ref 0–55)
AST: 16 U/L (ref 5–34)
Albumin: 3.5 g/dL (ref 3.5–5.0)
Alkaline Phosphatase: 49 U/L (ref 40–150)
Anion Gap: 10 meq/L (ref 3–11)
BUN: 10.9 mg/dL (ref 7.0–26.0)
CO2: 29 meq/L (ref 22–29)
Calcium: 9.8 mg/dL (ref 8.4–10.4)
Chloride: 102 meq/L (ref 98–109)
Creatinine: 0.8 mg/dL (ref 0.6–1.1)
EGFR: 67 ml/min/1.73 m2 — ABNORMAL LOW
Glucose: 112 mg/dL (ref 70–140)
Potassium: 3.2 meq/L — ABNORMAL LOW (ref 3.5–5.1)
Sodium: 141 meq/L (ref 136–145)
Total Bilirubin: 0.87 mg/dL (ref 0.20–1.20)
Total Protein: 6.6 g/dL (ref 6.4–8.3)

## 2016-08-02 LAB — CEA (IN HOUSE-CHCC): CEA (CHCC-In House): 2.66 ng/mL (ref 0.00–5.00)

## 2016-08-03 LAB — CEA: CEA1: 3.6 ng/mL (ref 0.0–4.7)

## 2016-08-04 LAB — CHROMOGRANIN A: CHROMOGRAN A: 2 nmol/L (ref 0–5)

## 2016-08-05 LAB — 5 HIAA, QUANTITATIVE, URINE, 24 HOUR
5-HIAA, Urine: 1.5 mg/L
5-HIAA,Quant.,24 Hr Urine: 1.7 mg/24 hr (ref 0.0–14.9)

## 2016-08-05 NOTE — Progress Notes (Signed)
[place]  [phone] [phone]  Clinic Follow Up Note   Patient Care Team: Bernerd Limbo, MD as PCP - General (Family Medicine) Coralie Keens, MD as Consulting Physician (General Surgery) Gatha Mayer, MD as Consulting Physician (Gastroenterology) Josue Hector, MD as Consulting Physician (Cardiology) Tania Ade, RN as Registered Nurse 08/06/2016   CHIEF COMPLAINTS:  Follow up mixed carcinoids-adenocarcinoma of appendix  HISTORY OF PRESENTING ILLNESS (05/29/2015):  Rachel Conner 81 y.o. 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 She is accompanied by her son to the clinic today. She came in a wheelchair.  She presented with abdominal pain, intermittent nausea and vomiting, diarrhea, for a few months. She was seen at [facility] emergency room in September, and CT of abdomen and pelvis revealed dilated appendix. She was referred to general surgeon Dr. Belenda Cruise, who brought her to OR on 04/11/2015 for appendectomy. She tolerated surgery well. The surgical path reviewed mixed carcinoid and adenocarcinoma, with area of small perforation. She was brought back to the OR on 04/28/2015, and underwent right hemicolectomy.  She has been recovering well from her surgery. She still feels quite fatigued, she is able to do most ADLs, but not much other activities. She has physical therapist and occupational therapist coming to her house twice a week. Her appetite has improved, she is eating better. Her pain incision has been much improved.  CURRENT THERAPY: Observation  INTERIM HISTORY Ms. Zuchowski returns for follow-up. She is accompanied by her daughter-in-law to the clinic today. She has moved to [place] a few months ago. She lives alone, has been quite depressed and the lonely. She had a fall at home a few weeks ago, no significant injury except soft tissue injury. She complains of fatigue and low  appetite.   MEDICAL HISTORY:  Past Medical History:  Diagnosis Date  . Anemia   . Anxiety   . Arthritis    Osteoarthrosis  . CAD (coronary artery disease) 1999   CABG  . Cancer [facility])    cancer of the appendix   . Cataract    rt eye per pt  . CHF (congestive heart failure) ([place])   . Colon polyps   . Confusion, postoperative   . DVT (deep venous thrombosis) ([place])   . GERD (gastroesophageal reflux disease)   . Hemorrhoids   . Hiatal hernia   . HLD (hyperlipidemia)   . HTN (hypertension)   . Hypothyroidism   . Major depressive disorder   . MI (myocardial infarction)   . Peripheral neuropathy ([place])     SURGICAL HISTORY: Past Surgical History:  Procedure Laterality Date  . CAROTID STENT  07/1998   stent RCA   . CHOLECYSTECTOMY  2011  . COLON SURGERY  03/2015   partial colon to remove cancer in appendix  . COLONOSCOPY W/ BIOPSIES    . CORONARY ARTERY BYPASS GRAFT  1999   6 vessel per pt.  . CORONARY STENT PLACEMENT  10/2001   circumflex stent  . ESOPHAGOGASTRODUODENOSCOPY    . LAPAROSCOPIC APPENDECTOMY N/A 04/11/2015   Procedure: LAPAROSCOPIC APPENDECTOMY;  Surgeon: Coralie Keens, MD;  Location: [place];  Service: General;  Laterality: N/A;  Laparoscopic converted to open appendectomy   . LUMBAR LAMINECTOMY     fusion 1971  . PARTIAL COLECTOMY N/A 04/28/2015   Procedure: Ileocecectomy;  Surgeon: Coralie Keens, MD;  Location: [place];  Service: General;  Laterality: N/A;  . Right Thumb Right    Thumb  Finger Release  . THYROID SURGERY      SOCIAL HISTORY: Social History   Social History  . Marital status: Married    Spouse name: N/A  . Number of children: 3  . Years of education: N/A   Occupational History  . retired    Social History Main Topics  . Smoking status: Former Smoker    Packs/day: 1.00    Years: 45.00    Quit date: 07/12/1997  . Smokeless tobacco: Never Used     Comment: quit smoking age 21  . Alcohol use No  . Drug use: No  . Sexual  activity: Not on file   Other Topics Concern  . Not on file   Social History Narrative   Widowed, lived [place] and his wife will stay with her after release from facility   Retired from being EMT/med tech   Originally from [place]   Able to perform ADL's independently    FAMILY HISTORY: Family History  Problem Relation Age of Onset  . Heart disease Father   . Heart disease Mother   . Heart disease Brother   . Cancer Sister     non hodgkins lymphoma  . Heart disease Other     grandfather (side unknown)  . Colon cancer Neg Hx     ALLERGIES:  is allergic to rivaroxaban; niacin; nitrogen; nitroglycerin; myrbetriq [mirabegron]; and niaspan [niacin er].  MEDICATIONS:  Current Outpatient Prescriptions  Medication Sig Dispense Refill  . ALPRAZolam (XANAX) 0.25 MG tablet TK 1 T PO  BID PRF ANXIETY.  2  . aspirin EC 81 MG tablet Take 81 mg by mouth.    Marland Kitchen atorvastatin (LIPITOR) 80 MG tablet Take 1 tablet (80 mg total) by mouth daily. 90 tablet 3  . esomeprazole (NEXIUM) 40 MG capsule Take 1 capsule (40 mg total) by mouth daily. 90 capsule 3  . levothyroxine (SYNTHROID, LEVOTHROID) 100 MCG tablet Take 100 mcg by mouth daily.      Marland Kitchen losartan-hydrochlorothiazide (HYZAAR) 50-12.5 MG tablet TAKE 1 TABLET DAILY 90 tablet 2  . ondansetron (ZOFRAN) 8 MG tablet Take 1 tablet (8 mg total) by mouth every 8 (eight) hours as needed for nausea or vomiting. 90 tablet 3  . rivaroxaban (XARELTO) 15 MG TABS tablet Take 1 tablet (15 mg total) by mouth daily with supper. 90 tablet 3  . prochlorperazine (COMPAZINE) 10 MG tablet Take 1 tablet (10 mg total) by mouth every 6 (six) hours as needed for nausea or vomiting. 30 tablet 1   Current Facility-Administered Medications  Medication Dose Route Frequency Provider Last Rate Last Dose  . 0.9 %  sodium chloride infusion  500 mL Intravenous Continuous Gatha Mayer, MD        REVIEW OF SYSTEMS:   Constitutional: Denies fevers, chills or abnormal night  sweats Eyes: Denies blurriness of vision, double vision or watery eyes Ears, nose, mouth, throat, and face: Denies mucositis or sore throat Respiratory: Denies cough, dyspnea or wheezes Cardiovascular: Denies palpitation, chest discomfort or lower extremity swelling Gastrointestinal:  Denies nausea, heartburn or change in bowel habits Skin: Denies abnormal skin rashes Lymphatics: Denies new lymphadenopathy or easy bruising Neurological:Denies numbness, tingling or new weaknesses Behavioral/Psych: Mood is stable, no new changes  All other systems were reviewed with the patient and are negative.  PHYSICAL EXAMINATION: ECOG PERFORMANCE STATUS: 1  Vitals:   08/06/16 1328  BP: (!) 138/56  Pulse: 93  Resp: 17  Temp: 97.7 F (36.5 C)   Filed Weights  08/06/16 1328  Weight: 189 lb 1.6 oz (85.8 kg)    GENERAL:alert, no distress and comfortable SKIN: skin color, texture, turgor are normal, no rashes or significant lesions EYES: normal, conjunctiva are pink and non-injected, sclera clear OROPHARYNX:no exudate, no erythema and lips, buccal mucosa, and tongue normal  NECK: supple, thyroid normal size, non-tender, without nodularity LYMPH:  no palpable lymphadenopathy in the cervical, axillary or inguinal LUNGS: clear to auscultation and percussion with normal breathing effort HEART: regular rate & rhythm and no murmurs and no lower extremity edema ABDOMEN:abdomen soft, non-tender and normal bowel sounds. (+) surgical incision sites are healing well. Musculoskeletal:no cyanosis of digits and no clubbing  PSYCH: alert & oriented x 3 with fluent speech NEURO: no focal motor/sensory deficits  LABORATORY DATA:  I have reviewed the data as listed CBC Latest Ref Rng & Units 08/02/2016 07/22/2016 04/16/2016  WBC 3.9 - 10.3 10e3/uL 8.7 10.4 8.9  Hemoglobin 11.6 - 15.9 g/dL 13.9 13.7 12.7  Hematocrit 34.8 - 46.6 % 42.4 41.1 40.0  Platelets 145 - 400 10e3/uL 298 300.0 301    CMP Latest Ref  Rng & Units 08/02/2016 04/16/2016 12/11/2015  Glucose 70 - 140 mg/dl 112 124 100  BUN 7.0 - 26.0 mg/dL 10.9 16.5 17.6  Creatinine 0.6 - 1.1 mg/dL 0.8 0.8 0.8  Sodium 136 - 145 mEq/L 141 142 139  Potassium 3.5 - 5.1 mEq/L 3.2(L) 3.8 3.9  Chloride 101 - 111 mmol/L - - -  CO2 22 - 29 mEq/L 29 29 29   Calcium 8.4 - 10.4 mg/dL 9.8 9.3 9.4  Total Protein 6.4 - 8.3 g/dL 6.6 6.7 6.9  Total Bilirubin 0.20 - 1.20 mg/dL 0.87 <0.30 0.40  Alkaline Phos 40 - 150 U/L 49 58 55  AST 5 - 34 U/L 16 16 13   ALT 0 - 55 U/L 16 14 10    Chromogranin A (0-5 nmol/L) 04/16/2016: 9 08/02/2016: 2  24-urine 5 HIAA (0-15mg /24h) 04/16/2016: 2.2    CEA 04/16/2016: 3.8 08/02/2016: 3.6  Results for MAYBELLE, FERO (MRN [id number]) as of 04/23/2016 06:49  Ref. Range 06/19/2015 09:30 03/11/2016 15:26 04/16/2016 11:30  Iron Latest Ref Range: 41 - 142 ug/dL 23 (L)  43  UIBC Latest Ref Range: 120 - 384 ug/dL 345  297  TIBC Latest Ref Range: 236 - 444 ug/dL 368  340  %SAT Latest Ref Range: 21 - 57 % 6 (L)  13 (L)  Ferritin Latest Ref Range: 9 - 269 ng/ml 20 8.6 (L) 22     PATHOLOGY REPORT  Diagnosis 04/28/2015 Colon, segmental resection for tumor, Distal illium and cecum - MIXED GOBLET CELL CARCINOID/CARCINOMA-ADENOCARCINOMA, SEE COMMENT. - TUMOR INVOLVES APPENDICEAL OS. - TUMOR INVOLVES FIBROFATTY SOFT TISSUE. AROUND THE APPENDICEAL OS - ONE LYMPH NODE, POSITIVE FOR METASTATIC TUMOR (1/5). - SURGICAL MARGINS, NEGATIVE FOR TUMOR. Microscopic Comment The previous appendectomy demonstrating mixed goblet cell carcinoid/carcinoma-adenocarcinoma is noted [id number] Please see previous case for additional tumor information. (CRR:ecj 04/29/2015)  Diagnosis 04/11/2015 Appendix, Other than Incidental - MIXED GOBLET CELL CARCINOID-ADENOCARCINOMA, GRADE 3, SPANNING 4.5 CM. - TUMOR INVADES THROUGH SEROSA. - PERINEURAL INVASION PRESENT. - RESECTION MARGIN IS POSITIVE. - SEE ONCOLOGY TABLE. Microscopic  Comment APPENDIX: Specimen: Appendix. Procedure: Appendectomy. Specimen Integrity: Disrupted.  Specimen Size: 4.5 x 3.0 x 1.9 cm. Tumor Site: Diffuse involving appendix. Tumor Size: Approximately 4.5 cm. Histologic Type: Mixed goblet cell carcinoma-adenocarcinoma. Histologic Grade: Grade 3 (poorly differentiated) Microscopic Tumor Extension: Through serosa. Margins: Proximal Margin: Involved. Mesenteric Margin: N/A. Adenoma present at proximal margin:  No. Lymph-Vascular Invasion: Not identified. Perineural Invasion: Present. Peritumoral Nodules (tumor deposits): Not identified. Lymph nodes: number examined 0; number positive: 0 TNM: pT4, pNX Ancillary studies: Can be performed upon request. Comments: The tumor consists of infiltrating nests and single goblet cells. Additionally there are areas of conventional gland forming adenocarcinoma. Immunohistochemistry reveals the goblet cells are positive for synaptophysin, chromogranin, and CD56. The tumor diffusely involves the appendix from the tip to the resection margin and is thus estimated at 4.5 cm in size. There is an area of possible perforation, although this is difficult to assess with the disrupted nature of the specimen. Tumor invades through the serosa. Dr. Donato Heinz has reviewed the case. The case was called to Dr. Ninfa Linden on 04/15/2015.   RADIOGRAPHIC STUDIES: I have personally reviewed the radiological images as listed and agreed with the findings in the report. Ct Head Wo Contrast  Result Date: 07/22/2016 CLINICAL DATA:  Trip and fall with head injury, initial encounter EXAM: CT HEAD WITHOUT CONTRAST TECHNIQUE: Contiguous axial images were obtained from the base of the skull through the vertex without intravenous contrast. COMPARISON:  05/09/2015 FINDINGS: Brain: Mild atrophic changes are seen. Chronic white matter ischemic changes noted as well. No findings to suggest acute hemorrhage, acute infarction or space-occupying mass  lesion are noted. Vascular: No hyperdense vessel or unexpected calcification. Skull: Normal. Negative for fracture or focal lesion. Sinuses/Orbits: No acute finding. Other: None. IMPRESSION: Chronic atrophic and ischemic changes without acute abnormality. Electronically Signed   By: Inez Catalina M.D.   On: 07/22/2016 13:45   Dg Hand Complete Left  Result Date: 07/22/2016 CLINICAL DATA:  Fall AND lt hand injury yesterday, pain/swelling on posterior 3rd metacarpal. EXAM: LEFT HAND - COMPLETE 3+ VIEW COMPARISON:  None. FINDINGS: There is no evidence of fracture or dislocation. Subchondral cyst or geode in the scaphoid. Chondrocalcinosis in the TFCC, suggesting CPPD. There is no evidence of arthropathy or other focal bone abnormality. Soft tissues are unremarkable. IMPRESSION: 1. No acute bone abnormality. Electronically Signed   By: Lucrezia Europe M.D.   On: 07/22/2016 15:40    ASSESSMENT & PLAN:  81 y.o. Caucasian female  1. Mixed goblet cell carcinoid-adenocarcinoma of appendix, pT4aN1Mx, at least stage IIIB -I previously reviewed her surgical pathology results with her in great details. The stage was also discussed with her. -we discussed that appendiceal mixed carcinoid and adenocarcinoma is rare type tumor, and we do not have large body of clinical trial data to guild [place] about the adjuvant chemotherapy and surveillance. -due to the component of adenocarcinoma, and advanced stage, high risk features including a T4 disease, positive nodes, and possible appendiceal perforation, I do think her risk of recurrence is high. -Patient declined adjuvant chemotherapy.  -She is clinically doing well, exam unremarkable. I reviewed her lab test results from last week, which showed normal CBC, CMP, CEA, chromogranin A and a 24-hour urine HIAA.  -Her recent screening colonoscopy in 02/2016 was negative -No clinical concern for recurrence. -Continue surveillance, for total 5 years. We'll see her back in 3 months with  a repeated surveillance CT scan.  2. Iron deficient anemia secondary to blood loss  -Resolved -Her colonoscopy was negative -Continue oral iron  3. HTN, hypothyroidism, CHF -She'll continue follow-up with her primary care physician  4. Depression  -She lives alone, socially isolated -I encouraged her to be more active.  -I encouraged her to follow-up with her primary care physician and discuss antidepressant medication.  5. Anorexia and nausea  -Possibly  related to depression -I reviewed her Compazine, she will use as needed.  Plan -RTC in 3 months with lab, CT abdomen and pelvis with contrast one week before  -I encouraged her to follow-up with her primary care physician Dr. Coletta Memos to discuss antidepressant management.  All questions were answered. The patient knows to call the clinic with any problems, questions or concerns. I spent 25 minutes counseling the patient face to face. The total time spent in the appointment was 30 minutes and more than 50% was on counseling.     Truitt Merle, MD 08/06/2016

## 2016-08-06 ENCOUNTER — Ambulatory Visit (HOSPITAL_BASED_OUTPATIENT_CLINIC_OR_DEPARTMENT_OTHER): Payer: Medicare Other | Admitting: Hematology

## 2016-08-06 ENCOUNTER — Encounter: Payer: Self-pay | Admitting: Hematology

## 2016-08-06 ENCOUNTER — Telehealth: Payer: Self-pay | Admitting: Hematology

## 2016-08-06 VITALS — BP 138/56 | HR 93 | Temp 97.7°F | Resp 17 | Ht 64.0 in | Wt 189.1 lb

## 2016-08-06 DIAGNOSIS — C181 Malignant neoplasm of appendix: Secondary | ICD-10-CM | POA: Diagnosis present

## 2016-08-06 DIAGNOSIS — C189 Malignant neoplasm of colon, unspecified: Secondary | ICD-10-CM

## 2016-08-06 DIAGNOSIS — I10 Essential (primary) hypertension: Secondary | ICD-10-CM | POA: Diagnosis not present

## 2016-08-06 DIAGNOSIS — E039 Hypothyroidism, unspecified: Secondary | ICD-10-CM

## 2016-08-06 DIAGNOSIS — I509 Heart failure, unspecified: Secondary | ICD-10-CM | POA: Diagnosis not present

## 2016-08-06 MED ORDER — PROCHLORPERAZINE MALEATE 10 MG PO TABS: 10.0000 mg | ORAL_TABLET | Freq: Four times a day (QID) | ORAL | 1 refills | Status: DC | PRN

## 2016-08-06 NOTE — Telephone Encounter (Signed)
Appointments scheduled per 1/26 LOS. Patient given AVS report and calendars with future scheduled appointments. Patient given two bottles of contrast and instructions for CT scan appointment. °

## 2016-08-09 ENCOUNTER — Other Ambulatory Visit: Payer: Self-pay | Admitting: *Deleted

## 2016-08-09 DIAGNOSIS — C189 Malignant neoplasm of colon, unspecified: Secondary | ICD-10-CM

## 2016-08-27 NOTE — Progress Notes (Signed)
CARDIOLOGY OFFICE NOTE  Date:  09/07/2016    Rachel Conner Date of Birth: Nov 21, 1935 Medical Record [id number]  PCP:  Rachel Inches, MD  Cardiologist:  Rachel Conner    Chief Complaint  Patient presents with  . Atherosclerosis of native coronary artery of native heart wi    History of Present Illness: Rachel Conner is a 81 y.o. female who presents today for a follow up visit.     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    She has a history of CAD with bypass surgery in 1999. She subsequently had stenting of the native RCA and has an occluded vein graft to the right. She also has a stent in the native obtuse marginal branch. Her last heart catheterization was in 2003. She was diagnosed with an extensive lower extremity DVT in 2014. She was treated with Coumadin for some time but then switched to Xarelto 15 mg daily. She's had issues with memory and seen at [facility] where she was diagnosed with possible vascular dementia. Her heart rate was noted to be low and Lopressor was stopped.  She complained of chest pain and had a preop Myoview done in 09/2014 which showed no ischemia or infarct EF 66%.   In 03/2015 she presented with abdominal pain, intermittent nausea and vomiting, diarrhea. She was seen at [facility] emergency room in September, and CT of abdomen and pelvis revealed dilated appendix. She was referred to general surgeon Rachel Conner, who brought her to OR on 04/11/2015 for appendectomy. She tolerated surgery well. The surgical path reviewed mixed carcinoid and adenocarcinoma, with area of small perforation. She was brought back to the OR on 04/28/2015, and underwent right hemicolectomy. She is followed by oncology - noted that her risk of recurrence is high - she has declined adjuvant chemotherapy.   Seen back in September by  Rachel Conner for evaluation of dizziness when standing. She had lost significant amounts of weight due to depression (her daughter is a drug addict). She had lost 40 lbs over the last 6 months. She was orthostatic. Norvasc was cut back.   07/22/16 tripped and fell head CT negative   Past Medical History:  Diagnosis Date  . Anemia   . Anxiety   . Arthritis    Osteoarthrosis  . CAD (coronary artery disease) 1999   CABG  . Cancer [facility])    cancer of the appendix   . Cataract    rt eye per pt  . CHF (congestive heart failure) ([place])   . Colon polyps   . Confusion, postoperative   . DVT (deep venous thrombosis) ([place])   . GERD (gastroesophageal reflux disease)   . Hemorrhoids   . Hiatal hernia   . HLD (hyperlipidemia)   . HTN (hypertension)   . Hypothyroidism   . Major depressive disorder   . MI (myocardial infarction)   . Peripheral neuropathy [facility])     Past Surgical History:  Procedure Laterality Date  . CAROTID STENT  07/1998   stent RCA   . CHOLECYSTECTOMY  2011  . COLON SURGERY  03/2015   partial colon to remove cancer in appendix  . COLONOSCOPY W/ BIOPSIES    . CORONARY ARTERY BYPASS GRAFT  1999   6 vessel per pt.  . CORONARY STENT PLACEMENT  10/2001   circumflex stent  .  ESOPHAGOGASTRODUODENOSCOPY    . LAPAROSCOPIC APPENDECTOMY N/A 04/11/2015   Procedure: LAPAROSCOPIC APPENDECTOMY;  Surgeon: Coralie Keens, MD;  Location: [place];  Service: General;  Laterality: N/A;  Laparoscopic converted to open appendectomy   . LUMBAR LAMINECTOMY     fusion 1971  . PARTIAL COLECTOMY N/A 04/28/2015   Procedure: Ileocecectomy;  Surgeon: Coralie Keens, MD;  Location: [place];  Service: General;  Laterality: N/A;  . Right Thumb Right    Thumb Finger Release  . THYROID SURGERY       Medications: Current Outpatient Prescriptions  Medication Sig Dispense Refill  . Acetaminophen-Caffeine (TENSION HEADACHE PO) Take 1-2 tablets by mouth daily as needed (headache).    .  ALPRAZolam (XANAX) 0.25 MG tablet Take 1 tablet (0.25 mg total) by mouth 2 (two) times daily. 60 tablet 0  . amLODipine (NORVASC) 10 MG tablet Take 1 tablet (10 mg total) by mouth daily. 90 tablet 3  . aspirin EC 81 MG tablet Take 81 mg by mouth 3 (three) times a week.     Marland Kitchen atorvastatin (LIPITOR) 80 MG tablet Take 1 tablet (80 mg total) by mouth daily. 90 tablet 3  . esomeprazole (NEXIUM) 40 MG capsule Take 1 capsule (40 mg total) by mouth daily. 90 capsule 3  . levothyroxine (SYNTHROID, LEVOTHROID) 100 MCG tablet Take 100 mcg by mouth daily.      . Loratadine 10 MG CAPS Take 10 mg by mouth daily.    . ondansetron (ZOFRAN) 8 MG tablet Take 1 tablet (8 mg total) by mouth every 8 (eight) hours as needed for nausea or vomiting. 90 tablet 3  . prochlorperazine (COMPAZINE) 10 MG tablet Take 1 tablet (10 mg total) by mouth every 6 (six) hours as needed for nausea or vomiting. 30 tablet 1  . Rivaroxaban (XARELTO) 15 MG TABS tablet Take 1 tablet (15 mg total) by mouth daily with supper. 90 tablet 3   Current Facility-Administered Medications  Medication Dose Route Frequency Provider Last Rate Last Dose  . 0.9 %  sodium chloride infusion  500 mL Intravenous Continuous Rachel Mayer, MD        Allergies: Allergies  Allergen Reactions  . Niacin     Severe flushing  . Nitrogen     Hypotension  . Nitroglycerin     Hypotension  . Myrbetriq [Mirabegron]     Gross hematuria  . Niaspan [Niacin Er]     Severe flushing    Social History: The patient  reports that she quit smoking about 19 years ago. She has a 45.00 pack-year smoking history. She has never used smokeless tobacco. She reports that she does not drink alcohol or use drugs.   Family History: The patient's family history includes Cancer in her sister; Heart disease in her brother, father, mother, and other.   Review of Systems: Please see the history of present illness.   Otherwise, the review of systems is positive for none.   All  other systems are reviewed and negative.   Physical Exam: VS:  BP (!) 118/54   Pulse 84   Ht 5\' 4"  (1.626 m)   Wt 188 lb 12.8 oz (85.6 kg)   SpO2 (!) 84%   BMI 32.41 kg/m  .  BMI Body mass index is 32.41 kg/m.  Wt Readings from Last 3 Encounters:  09/07/16 188 lb 12.8 oz (85.6 kg)  08/06/16 189 lb 1.6 oz (85.8 kg)  07/22/16 189 lb 8 oz (86 kg)    General: Very tangential  in thoughts. Weight is down 6 pounds. She is alert and in no acute distress.   HEENT: Normal.  Neck: Supple, no JVD, carotid bruits, or masses noted.  Cardiac: Regular rate and rhythm. No murmurs, rubs, or gallops. No edema.  Respiratory:  Lungs are clear to auscultation bilaterally with normal work of breathing.  GI: Soft and nontender.  MS: No deformity or atrophy. Gait and ROM intact.  Skin: Warm and dry. Color is normal.  Neuro:  Strength and sensation are intact and no gross focal deficits noted.  Psych: Alert, appropriate and with normal affect.   LABORATORY DATA:  EKG:  EKG is not ordered today.  Lab Results  Component Value Date   WBC 8.7 08/02/2016   HGB 13.9 08/02/2016   HCT 42.4 08/02/2016   PLT 298 08/02/2016   GLUCOSE 112 08/02/2016   CHOL 144 04/07/2016   TRIG 199 (H) 04/07/2016   HDL 43 (L) 04/07/2016   LDLCALC 61 04/07/2016   ALT 16 08/02/2016   AST 16 08/02/2016   NA 141 08/02/2016   K 3.2 (L) 08/02/2016   CL 97 (L) 05/09/2015   CREATININE 0.8 08/02/2016   BUN 10.9 08/02/2016   CO2 29 08/02/2016   TSH 1.24 11/19/2011   INR 1.02 04/28/2015   HGBA1C 6.4 (H) 09/11/2012    BNP (last 3 results) No results for input(s): BNP in the last 8760 hours.  ProBNP (last 3 results) No results for input(s): PROBNP in the last 8760 hours.   Other Studies Reviewed Today:  Notes Recorded by Josue Hector, MD on 09/12/2014 at 5:16 PM Normal myovue study with no evidence of ischemia or infarction     Assessment/Plan:  Dizziness with standing: this has resolved with cutting back  Norvasc. I have left her on her current regimen.   HLD: continue statin. Lipids from September noted.   CAD s/p CABG 1999  and PCI: low risk myoview in 09/2014. No chest pain reported.   Appendiceal cancer s/p appendectomy and hemicolectomy (04/2015): followed closely by Dr. Burr Medico  Hx of DVT: continue on Xarelto - recent CBC ok. No bleeding noted.    Current medicines are reviewed with the patient today.  The patient does not have concerns regarding medicines other than what has been noted above.  The following changes have been made:  See above.  Labs/ tests ordered today include:   No orders of the defined types were placed in this encounter.    Disposition:   FU with me 6 months    Jenkins Rouge

## 2016-09-07 ENCOUNTER — Ambulatory Visit (INDEPENDENT_AMBULATORY_CARE_PROVIDER_SITE_OTHER): Payer: Medicare Other | Admitting: Cardiovascular Disease

## 2016-09-07 ENCOUNTER — Encounter: Payer: Self-pay | Admitting: Cardiovascular Disease

## 2016-09-07 VITALS — BP 118/54 | HR 84 | Ht 64.0 in | Wt 188.8 lb

## 2016-09-07 DIAGNOSIS — I2510 Atherosclerotic heart disease of native coronary artery without angina pectoris: Secondary | ICD-10-CM

## 2016-09-07 MED ORDER — ATORVASTATIN CALCIUM 80 MG PO TABS: 80.0000 mg | ORAL_TABLET | Freq: Every day | ORAL | 3 refills | Status: DC

## 2016-09-07 MED ORDER — ALPRAZOLAM 0.25 MG PO TABS: 0.2500 mg | ORAL_TABLET | Freq: Two times a day (BID) | ORAL | 0 refills | Status: DC

## 2016-09-07 MED ORDER — RIVAROXABAN 15 MG PO TABS: 15.0000 mg | ORAL_TABLET | Freq: Every day | ORAL | 3 refills | Status: DC

## 2016-09-07 MED ORDER — AMLODIPINE BESYLATE 10 MG PO TABS: 10.0000 mg | ORAL_TABLET | Freq: Every day | ORAL | 3 refills | Status: DC

## 2016-09-07 NOTE — Patient Instructions (Signed)

## 2016-09-07 NOTE — Addendum Note (Signed)
Addended by: Aris Georgia, Ainara Eldridge L on: 09/07/2016 03:03 PM   Modules accepted: Orders

## 2016-09-10 ENCOUNTER — Telehealth: Payer: Self-pay | Admitting: Cardiovascular Disease

## 2016-09-10 NOTE — Telephone Encounter (Signed)
Called pharmacy to informed them that pt will have to refill with her PCP. [facility] script verbalized understanding.

## 2016-09-10 NOTE — Telephone Encounter (Signed)
Patient received a printed and signed prescription at her office visit. Patient has no refills. Patient is suppose to have PCP start refilling this medication. Dr. Johnsie Cancel would only give her a 30 day supply.

## 2016-09-10 NOTE — Telephone Encounter (Signed)
[facility] is requesting a refill 90 day supply of Alprazolam .25 mg tablet. This is a controlled substance. Please advise

## 2016-09-16 ENCOUNTER — Other Ambulatory Visit: Payer: Self-pay

## 2016-09-16 MED ORDER — ALPRAZOLAM 0.25 MG PO TABS: 0.2500 mg | ORAL_TABLET | Freq: Two times a day (BID) | ORAL | 0 refills | Status: DC

## 2016-09-30 ENCOUNTER — Telehealth: Payer: Self-pay | Admitting: Cardiovascular Disease

## 2016-09-30 NOTE — Telephone Encounter (Signed)
[facility] refill on Alprazolam .25 mg tablet. Would you like to refill this medication? Please advise

## 2016-09-30 NOTE — Telephone Encounter (Signed)
[facility] and informed [facility] that the pt has to request a refill with PCP. [facility] verbalized understanding.

## 2016-09-30 NOTE — Telephone Encounter (Signed)
No refill on this medication. Patient is to have her PCP take over refilling this medication. Dr. Johnsie Cancel filled it once as a courtesy.

## 2016-10-13 ENCOUNTER — Encounter: Payer: Self-pay | Admitting: Family Medicine

## 2016-10-13 ENCOUNTER — Ambulatory Visit (INDEPENDENT_AMBULATORY_CARE_PROVIDER_SITE_OTHER): Payer: Medicare Other | Admitting: Family Medicine

## 2016-10-13 VITALS — BP 98/66 | HR 86 | Temp 98.0°F | Resp 20 | Ht 64.0 in | Wt 188.0 lb

## 2016-10-13 DIAGNOSIS — I25119 Atherosclerotic heart disease of native coronary artery with unspecified angina pectoris: Secondary | ICD-10-CM

## 2016-10-13 DIAGNOSIS — Z7689 Persons encountering health services in other specified circumstances: Secondary | ICD-10-CM

## 2016-10-13 DIAGNOSIS — F0150 Vascular dementia without behavioral disturbance: Secondary | ICD-10-CM | POA: Diagnosis not present

## 2016-10-13 DIAGNOSIS — I119 Hypertensive heart disease without heart failure: Secondary | ICD-10-CM

## 2016-10-13 DIAGNOSIS — E7800 Pure hypercholesterolemia, unspecified: Secondary | ICD-10-CM | POA: Diagnosis not present

## 2016-10-13 DIAGNOSIS — F418 Other specified anxiety disorders: Secondary | ICD-10-CM | POA: Insufficient documentation

## 2016-10-13 DIAGNOSIS — I82409 Acute embolism and thrombosis of unspecified deep veins of unspecified lower extremity: Secondary | ICD-10-CM | POA: Diagnosis not present

## 2016-10-13 DIAGNOSIS — E890 Postprocedural hypothyroidism: Secondary | ICD-10-CM | POA: Diagnosis not present

## 2016-10-13 DIAGNOSIS — D508 Other iron deficiency anemias: Secondary | ICD-10-CM | POA: Diagnosis not present

## 2016-10-13 DIAGNOSIS — C189 Malignant neoplasm of colon, unspecified: Secondary | ICD-10-CM | POA: Diagnosis not present

## 2016-10-13 DIAGNOSIS — J301 Allergic rhinitis due to pollen: Secondary | ICD-10-CM

## 2016-10-13 DIAGNOSIS — Z7901 Long term (current) use of anticoagulants: Secondary | ICD-10-CM | POA: Diagnosis not present

## 2016-10-13 DIAGNOSIS — K219 Gastro-esophageal reflux disease without esophagitis: Secondary | ICD-10-CM | POA: Insufficient documentation

## 2016-10-13 MED ORDER — ESOMEPRAZOLE MAGNESIUM 40 MG PO CPDR: 40.0000 mg | DELAYED_RELEASE_CAPSULE | Freq: Every day | ORAL | 3 refills | Status: DC

## 2016-10-13 MED ORDER — LORATADINE 10 MG PO CAPS: 10.0000 mg | ORAL_CAPSULE | Freq: Every day | ORAL | 3 refills | Status: DC

## 2016-10-13 MED ORDER — ALPRAZOLAM 0.25 MG PO TABS: 0.2500 mg | ORAL_TABLET | Freq: Two times a day (BID) | ORAL | 0 refills | Status: DC

## 2016-10-13 MED ORDER — ESCITALOPRAM OXALATE 5 MG PO TABS: 5.0000 mg | ORAL_TABLET | Freq: Every day | ORAL | 0 refills | Status: DC

## 2016-10-13 NOTE — Progress Notes (Signed)
Patient ID: Rachel Conner, female  DOB: 04-23-1936, 81 y.o.   MRN: [id number] Patient Care Team    Relationship Specialty Notifications Start End  Ma Hillock, DO PCP - General Family Medicine  10/13/16   Coralie Keens, MD Consulting Physician General Surgery  05/29/15   Gatha Mayer, MD Consulting Physician Gastroenterology  05/29/15   Josue Hector, MD Consulting Physician Cardiology  05/29/15   Tania Ade, RN Registered Nurse  Admissions 05/29/15    Comment: GI Oncology Nurse Navigator  Truitt Merle, MD Consulting Physician Hematology  10/13/16    Comment: colon cancer    Subjective:  Rachel Conner is a 81 y.o.  female present for new patient establishment. All past medical history, surgical history, allergies, family history, immunizations, medications and social history were Obtained and Entered in the electronic medical record today. All recent labs, ED visits and hospitalizations within the last year were reviewed. Patient presents with her daughter today. She has a significant medical history of colon cancer, DVT 2 on Xarelto, hypertension, history of MI (followed by Dr. Johnsie Cancel), thyroid disease, bilateral contacts in urinary incontinence.  Severe depression/anxiety: Patient currently on 0.25 mg twice a day of Xanax only. She states that she has been getting the Xanax per her cardiologist. She reports at one time she was on Effexor, Cymbalta and trazodone. She felt the Effexor and Cymbalta causes dizziness. She doesn't recall why she stopped the trazodone but thinks she had side effect. She reports her last Xanax use was approximately 1 week ago because she is running out and she only has one left. She reports increased depression and anxiety since the loss of her husband suddenly in 2015. Patient doesn't bulge information, however EMR review suggests patient was admitted to behavioral health on February 2017 for major depression. 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 Note states that she was abusing benzodiazepines at that time, taking up to 4 mg of Xanax a day. At that time patient reported taking about he herself. She denies any current SI or HI.  History colon cancer: Patient was diagnosed with mixed carcinoid-adenocarcinoma of the appendix late 2016. 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 One lymph node was positive for metastatic disease out of 5. She was staged  [id number] stage IIIB. She then underwent right hemicolectomy October 2016. It is felt by oncology that her recurrence rate is high. She declined adjuvant  chemotherapy. She is followed by oncology, Dr. Burr Medico every 3 months, and CT scan every 6 months. She is to continue surveillance a total 5 years, last colonoscopy August 2017 was negative. She does have iron deficiency anemia, and is prescribed oral iron.  Health maintenance:  Colonoscopy: August 2017. History of colon cancer 2016. Followed by oncology, Dr.Feng and GI Dr. Carlean Purl.  Mammogram: Unknown Immunizations: Immunizations are unknown  DEXA: Unknown  Mood disorder screen negative today.  Depression screen [facility] 2/9 10/13/2016 10/13/2016  Decreased Interest 3 0  Down, Depressed, Hopeless 3 2  PHQ - 2 Score 6 2  Altered sleeping 3 1  Tired, decreased energy 3 3  Change in appetite 3 2  Feeling bad or failure about yourself  3 0  Trouble concentrating 3 0  Moving slowly or fidgety/restless 3 0  Suicidal thoughts 3 0  PHQ-9 Score 27 8  Difficult doing work/chores Very difficult Somewhat difficult  GAD 7 : Generalized Anxiety Score 10/13/2016  Nervous, Anxious, on Edge 3  Control/stop worrying 3  Worry too much - different things 2  Trouble relaxing 3  Restless 3  Easily annoyed or irritable 3  Afraid - awful might happen 2  Total GAD 7 Score 19  Anxiety Difficulty Very difficult    There is  no immunization history for the selected administration types on file for this patient.   Past Medical History:  Diagnosis Date  . Anemia   . Anxiety   . Arthritis    Osteoarthrosis  . CAD (coronary artery disease) 1999   CABG  . Cancer [facility])    cancer of the appendix   . Cataract    rt eye per pt  . CHF (congestive heart failure) ([place])   . Colon cancer ([place]) 2017  . Colon polyps   . Confusion, postoperative   . DVT (deep venous thrombosis) ([place])   . GERD (gastroesophageal reflux disease)   . Hemorrhoids   . Hiatal hernia   . HLD (hyperlipidemia)   . HTN (hypertension)   . Hypothyroidism   . Major depressive disorder   . MI (myocardial infarction)   . Peripheral neuropathy ([place])   . Urinary incontinence    Allergies  Allergen Reactions  . Niacin     Severe flushing  . Nitrogen     Hypotension  . Nitroglycerin     Hypotension  . Myrbetriq [Mirabegron]     Gross hematuria  . Niaspan [Niacin Er]     Severe flushing   Past Surgical History:  Procedure Laterality Date  . CAROTID STENT  07/1998   stent RCA   . CHOLECYSTECTOMY  2011  . COLON SURGERY  03/2015   partial colon to remove cancer in appendix  . COLONOSCOPY W/ BIOPSIES    . CORONARY ARTERY BYPASS GRAFT  1999   6 vessel per pt.  . CORONARY STENT PLACEMENT  10/2001   circumflex stent  . ESOPHAGOGASTRODUODENOSCOPY    . LAPAROSCOPIC APPENDECTOMY N/A 04/11/2015   Procedure: LAPAROSCOPIC APPENDECTOMY;  Surgeon: Coralie Keens, MD;  Location: [place];  Service: General;  Laterality: N/A;  Laparoscopic converted to open appendectomy   . LUMBAR LAMINECTOMY     fusion 1971  . PARTIAL COLECTOMY N/A 04/28/2015   Procedure: Ileocecectomy;  Surgeon: Coralie Keens, MD;  Location: [place];  Service: General;  Laterality: N/A;  . Right Thumb Right    Thumb Finger Release  . THYROID SURGERY     Family History  Problem Relation Age of Onset  . Heart disease Father   . Heart disease Mother   . Heart disease Brother    . Cancer Sister     non hodgkins lymphoma  . Heart disease Other     grandfather (side unknown)  . Colon cancer Neg Hx    Social History   Social History  . Marital status: Widowed    Spouse name: N/A  . Number of children: 3  . Years of education: 28   Occupational History  . retired EMT    Social History Main Topics  . Smoking status: Former Smoker    Packs/day: 1.00    Years: 45.00    Quit date: 07/12/1997  . Smokeless tobacco: Never Used     Comment: quit smoking age 81  . Alcohol use No  . Drug use: No  . Sexual activity: No   Other Topics Concern  . Not on file   Social History  Narrative   Widowed, lives alone, son and DIL live close.    Retired from being EMT/med tech   Originally from [place]   Able to perform ADL's independently   [place] a daily vitamin   Wears her seatbelt   Wears dentures   Smoke detector in the home.   Feels safe in her relationships   Allergies as of 10/13/2016      Reactions   Niacin    Severe flushing   Nitrogen    Hypotension   Nitroglycerin    Hypotension   Myrbetriq [mirabegron]    Gross hematuria   Niaspan [niacin Er]    Severe flushing      Medication List       Accurate as of 10/13/16  6:53 PM. Always use your most recent med list.          ALPRAZolam 0.25 MG tablet Commonly known as:  XANAX Take 1 tablet (0.25 mg total) by mouth 2 (two) times daily.   amLODipine 10 MG tablet Commonly known as:  NORVASC Take 1 tablet (10 mg total) by mouth daily.   aspirin EC 81 MG tablet Take 81 mg by mouth 3 (three) times a week.   atorvastatin 80 MG tablet Commonly known as:  LIPITOR Take 1 tablet (80 mg total) by mouth daily.   escitalopram 5 MG tablet Commonly known as:  LEXAPRO Take 1 tablet (5 mg total) by mouth daily.   esomeprazole 40 MG capsule Commonly known as:  NEXIUM Take 1 capsule (40 mg total) by mouth daily.   levothyroxine 100 MCG tablet Commonly known as:  SYNTHROID, LEVOTHROID Take 100 mcg by  mouth daily.   Loratadine 10 MG Caps Take 1 capsule (10 mg total) by mouth daily.   prochlorperazine 10 MG tablet Commonly known as:  COMPAZINE Take 1 tablet (10 mg total) by mouth every 6 (six) hours as needed for nausea or vomiting.   Rivaroxaban 15 MG Tabs tablet Commonly known as:  XARELTO Take 1 tablet (15 mg total) by mouth daily with supper.   TENSION HEADACHE PO Take 1-2 tablets by mouth daily as needed (headache).        Recent Results (from the past 2160 hour(s))  CBC with Differential/Platelet     Status: None   Collection Time: 07/22/16  3:34 PM  Result Value Ref Range   WBC 10.4 4.0 - 10.5 K/uL   RBC 4.57 3.87 - 5.11 Mil/uL   Hemoglobin 13.7 12.0 - 15.0 g/dL   HCT 41.1 36.0 - 46.0 %   MCV 90.0 78.0 - 100.0 fl   MCHC 33.4 30.0 - 36.0 g/dL   RDW 14.5 11.5 - 15.5 %   Platelets 300.0 150.0 - 400.0 K/uL   Neutrophils Relative % 63.5 43.0 - 77.0 %   Lymphocytes Relative 27.3 12.0 - 46.0 %   Monocytes Relative 7.3 3.0 - 12.0 %   Eosinophils Relative 1.6 0.0 - 5.0 %   Basophils Relative 0.3 0.0 - 3.0 %   Neutro Abs 6.6 1.4 - 7.7 K/uL   Lymphs Abs 2.8 0.7 - 4.0 K/uL   Monocytes Absolute 0.8 0.1 - 1.0 K/uL   Eosinophils Absolute 0.2 0.0 - 0.7 K/uL   Basophils Absolute 0.0 0.0 - 0.1 K/uL  Ferritin     Status: None   Collection Time: 07/22/16  3:34 PM  Result Value Ref Range   Ferritin 26.2 10.0 - 291.0 ng/mL  CBC with Differential     Status: None  Collection Time: 08/02/16  9:15 AM  Result Value Ref Range   WBC 8.7 3.9 - 10.3 10e3/uL   NEUT# 5.6 1.5 - 6.5 10e3/uL   HGB 13.9 11.6 - 15.9 g/dL   HCT 42.4 34.8 - 46.6 %   Platelets 298 145 - 400 10e3/uL   MCV 92.0 79.5 - 101.0 fL   MCH 30.2 25.1 - 34.0 pg   MCHC 32.8 31.5 - 36.0 g/dL   RBC 4.61 3.70 - 5.45 10e6/uL   RDW 14.4 11.2 - 14.5 %   lymph# 2.1 0.9 - 3.3 10e3/uL   MONO# 0.7 0.1 - 0.9 10e3/uL   Eosinophils Absolute 0.3 0.0 - 0.5 10e3/uL   Basophils Absolute 0.0 0.0 - 0.1 10e3/uL   NEUT% 64.8 38.4 -  76.8 %   LYMPH% 23.9 14.0 - 49.7 %   MONO% 8.2 0.0 - 14.0 %   EOS% 2.9 0.0 - 7.0 %   BASO% 0.2 0.0 - 2.0 %  Chromogranin A     Status: None   Collection Time: 08/02/16  9:15 AM  Result Value Ref Range   Chromogranin A 2 0 - 5 nmol/L    Comment: Chromogranin A performed by [employer]. Results for this test are designated to be for research purposes only by the assay's manufacturer. The performance characteristics of this product have not been established. Results for this test should not be used as absolute evidence of presence or absence of malignant disease without confirmation of the diagnosis by another medically established diagnostic product or procedure. Values obtained with different assay methods or kits cannot be used interchangeably.   CEA (IN HOUSE-[facility])     Status: None   Collection Time: 08/02/16  9:15 AM  Result Value Ref Range   CEA ([facility]-In House) 2.66 0.00 - 5.00 ng/mL    Comment: This test was performed using [employer] Chem[employer]uminescent Microparticle Immunoassay. Values obtained from different assay methods cannot be used interchageably. Please note that 5-10% of patients who smoke may see CEA levels up to 6.9 ng/mL.  CEA     Status: None   Collection Time: 08/02/16  9:15 AM  Result Value Ref Range   CEA 3.6 0.0 - 4.7 ng/mL    Comment:                       Roche ECLIA methodology       Nonsmokers  <3.9                                                     Smokers     <5.6   Comprehensive metabolic panel     Status: Abnormal   Collection Time: 08/02/16  9:15 AM  Result Value Ref Range   Sodium 141 136 - 145 mEq/L   Potassium 3.2 (L) 3.5 - 5.1 mEq/L   Chloride 102 98 - 109 mEq/L   CO2 29 22 - 29 mEq/L   Glucose 112 70 - 140 mg/dl    Comment: Glucose reference range is for nonfasting patients. Fasting glucose reference range is 70- 100.   BUN 10.9 7.0 - 26.0 mg/dL   Creatinine 0.8 0.6 - 1.1 mg/dL   Total Bilirubin 0.87 0.20 - 1.20 mg/dL    Alkaline Phosphatase 49 40 - 150 U/L   AST 16 5 - 34 U/L  ALT 16 0 - 55 U/L   Total Protein 6.6 6.4 - 8.3 g/dL   Albumin 3.5 3.5 - 5.0 g/dL   Calcium 9.8 8.4 - 10.4 mg/dL   Anion Gap 10 3 - 11 mEq/L   EGFR 67 (L) >90 ml/min/1.73 m2    Comment: eGFR is calculated using the CKD-EPI Creatinine Equation (2009)  5 HIAA, quantitative, urine, 24 hour     Status: None   Collection Time: 08/02/16  9:24 AM  Result Value Ref Range   5-HIAA, Urine 1.5 Undefined mg/L    Comment: Total Volume: 1100 mL   5-HIAA,Quant.,24 Hr Urine 1.7 0.0 - 14.9 mg/24 hr    Comment: This test was developed and its performance characteristics determined by [facility]. It has not been cleared or approved by the Food and Drug Administration.     Ct Head Wo Contrast  Result Date: 07/22/2016 CLINICAL DATA:  Trip and fall with head injury, initial encounter EXAM: CT HEAD WITHOUT CONTRAST TECHNIQUE: Contiguous axial images were obtained from the base of the skull through the vertex without intravenous contrast. COMPARISON:  05/09/2015 FINDINGS: Brain: Mild atrophic changes are seen. Chronic white matter ischemic changes noted as well. No findings to suggest acute hemorrhage, acute infarction or space-occupying mass lesion are noted. Vascular: No hyperdense vessel or unexpected calcification. Skull: Normal. Negative for fracture or focal lesion. Sinuses/Orbits: No acute finding. Other: None. IMPRESSION: Chronic atrophic and ischemic changes without acute abnormality. Electronically Signed   By: Inez Catalina M.D.   On: 07/22/2016 13:45   ROS: 14 pt review of systems performed and negative (unless mentioned in an HPI)  Objective: BP 98/66 (BP Location: Right Arm, Patient Position: Sitting, Cuff Size: Large)   Pulse 86   Temp 98 F (36.7 C)   Resp 20   Ht _0  (1.626 m)   Wt 188 lb (85.3 kg)   SpO2 96%   BMI 32.27 kg/m  Gen: Afebrile. No acute distress. Nontoxic in appearance, well-developed, well-nourished,  pleasant  Caucasian female. HENT: AT. [place].  MMM, no oral lesions, adequate dentition.  Eyes:Pupils Equal Round Reactive to light, Extraocular movements intact,  Conjunctiva without redness, discharge or icterus. Neck/lymp/endocrine: Supple, no lymphadenopathy CV: RRR , no edema, +2/4 P posterior tibialis pulses. No carotid bruits. No JVD. Chest: CTAB, no wheeze, rhonchi or crackles.  Abd: Soft.  NTND. Bowel sounds presentt. Neuro/Msk: Normal gait. PERLA. EOMi. Alert. Oriented x3.   Psych: Talkative, patient wanted to talk about her husband and how he passed away. Although saddened, she states it feels good to talk about it. Many times during conversation with her today she is able to exhibit a good sense of humor and laughs. She seems to have a hard time also with the fact that 2 of her children do not speak to her, and this does not allow her time to spend with her granddaughter. She does exhibit mild abrasiveness/disinhibition in conversation with her daughter. Speech is normal in tone and speed. She is able to stay focused. She appears to have normal thought content and judgment. She is well-groomed. No SI or HI.   Assessment/plan: Rachel Conner is a 81 y.o. female present for est care. HYPERTENSIVE CARDIOVASCULAR DISEASE, BENIGN Atherosclerosis of native coronary artery of native heart with angina pectoris (HCC) Deep vein thrombosis (DVT) of lower extremity, unspecified chronicity, unspecified laterality, unspecified vein (HCC) Vascular dementia without behavioral disturbance HYPERCHOLESTEROLEMIA, MIXED Long term current use of anticoagulant therapy - On xarelto for DVTx2.  -  lipitor, norvasc, xarelto (and ASA 3x/w) through cardiology.  - continue routine f/u q 6 months with cardiology Dr. Johnsie Cancel.   Mixed carcinoid-adenocarcinoma of colon ([place]) Other iron deficiency anemia - High risk of reoccurrence.  - Continue follow-up with Dr. Tennis Must every 3 months. CT q 6 months.  - Continue Iron  supplement as suggested by ONC - compazine provided through onc.  - Continue colonoscopy surveillance per ONC protocol with Dr. Carlean Purl.   Gastroesophageal reflux disease without esophagitis - well controlled in nexium 40, follows with Dr. Carlean Purl. - esomeprazole (NEXIUM) 40 MG capsule; Take 1 capsule (40 mg total) by mouth daily.  Dispense: 90 capsule; Refill: 3  Chronic seasonal allergic rhinitis due to pollen - seasonal allergies are worsening, refilled Claritin for her. - Loratadine 10 MG CAPS; Take 1 capsule (10 mg total) by mouth daily.  Dispense: 90 each; Refill: 3  Postoperative hypothyroidism - last TSH May 2017 normal.  - refills on synthroid 100 mcg today.  - follow yearly.   Depression with anxiety - Lengthy discussion with patient's daughter and patient today surrounding options for treatment. Agreeable go of Lexapro low-dose 5 mg start today, and she is to taper off the Xanax. - She was provided a prescription today for Xanax at current dose, that ideally will not be refilled. She is to only use these in emergencies. She was educated on sedating properties, addiction and abuse potential. She agrees to taper off Xanax. She has not had a Xanax in 7 days. Per EMR review she has been documented as history of benzodiazepine abuse.  - Given her high depression and anxiety screening, and after she left I was able to review the care everywhere TAB which indicated history of severe depression and behavioral health admission, she most likely will need tapered up on Lexapro. Patient wanted to start with low-dose today. - She has declined therapy in the past per EMR. On 3 week follow-up will discuss considering counseling. If she is agreeable, ideally would be nice to have her with her new behavioral health specialist here in [place]. - Follow-up 4 weeks, before needing refill on medication. We'll likely taper up to 10 mg or more at that visit.  Return in about 3 weeks (around  11/03/2016).  Greater than 45 minutes was spent with patient, greater than 50% of that time was spent face-to-face with patient counseling and/or coordinating care. Time spent surrounding counseling with patient and her daughter today, as well as attempting to go through medical records to gather full history and this complex patient.   Electronically signed by: Howard Pouch, DO [place]

## 2016-10-13 NOTE — Patient Instructions (Signed)
It was a pleasure meeting you today.  I will see you back in 3 weeks to see how the lexapro is doing. We can increase it if needed when we see you back. This is a low dose.   I have refilled xanax for you to use sparingly and eventually taper off this off completely.   Please help [place] help you:  We are honored you have chosen [facility] for your Primary Care home. Below you will find basic instructions that you may need to access in the future. Please help [place] help you by reading the instructions, which cover many of the frequent questions we experience.   Prescription refills and request:  -In order to allow more efficient response time, please call your pharmacy for all refills. They will forward the request electronically to [place]. This allows for the quickest possible response. Request left on a nurse line can take longer to refill, since these are checked as time allows between office patients and other phone calls.  - refill request can take up to 3-5 working days to complete.  - If request is sent electronically and request is appropiate, it is usually completed in 1-2 business days.  - all patients will need to be seen routinely for all chronic medical conditions requiring prescription medications (see follow-up below). If you are overdue for follow up on your condition, you will be asked to make an appointment and we will call in enough medication to cover you until your appointment (up to 30 days).  - all controlled substances will require a face to face visit to request/refill.  - if you desire your prescriptions to go through a new pharmacy, and have an active script at original pharmacy, you will need to call your pharmacy and have scripts transferred to new pharmacy. This is completed between the pharmacy locations and not by your provider.    Results: If any images or labs were ordered, it can take up to 1 week to get results depending on the test ordered and the lab/facility  running and resulting the test. - Normal or stable results, which do not need further discussion, will be released to your mychart immediately with attached note to you. A call will not be generated for normal results. Please make certain to sign up for mychart. If you have questions on how to activate your mychart you can call the front office.  - If your results need further discussion, our office will attempt to contact you via phone, and if unable to reach you after 2 attempts, we will release your abnormal result to your mychart with instructions.  - All results will be automatically released in mychart after 1 week.  - Your provider will provide you with explanation and instruction on all relevant material in your results. Please keep in mind, results and labs may appear confusing or abnormal to the untrained eye, but it does not mean they are actually abnormal for you personally. If you have any questions about your results that are not covered, or you desire more detailed explanation than what was provided, you should make an appointment with your provider to do so.   Our office handles many outgoing and incoming calls daily. If we have not contacted you within 1 week about your results, please check your mychart to see if there is a message first and if not, then contact our office.  In helping with this matter, you help decrease call volume, and therefore allow [place]  to be able to respond to patients needs more efficiently.   Acute office visits (sick visit):  An acute visit is intended for a new problem and are scheduled in shorter time slots to allow schedule openings for patients with new problems. This is the appropriate visit to discuss a new problem. In order to provide you with excellent quality medical care with proper time for you to explain your problem, have an exam and receive treatment with instructions, these appointments should be limited to one new problem per visit. If you experience  a new problem, in which you desire to be addressed, please make an acute office visit, we save openings on the schedule to accommodate you. Please do not save your new problem for any other type of visit, let us take care of it properly and quickly for you.   Follow up visits:  Depending on your condition(s) your provider will need to see you routinely in order to provide you with quality care and prescribe medication(s). Most chronic conditions (Example: hypertension, Diabetes, depression/anxiety... etc), require visits a couple times a year. Your provider will instruct you on proper follow up for your personal medical conditions and history. Please make certain to make follow up appointments for your condition as instructed. Failing to do so could result in lapse in your medication treatment/refills. If you request a refill, and are overdue to be seen on a condition, we will always provide you with a 30 day script (once) to allow you time to schedule.    Medicare wellness (well visit): - we have a wonderful Nurse Maudie Mercury), that will meet with you and provide you will yearly medicare wellness visits. These visits should occur yearly (can not be scheduled less than 1 calendar year apart) and cover preventive health, immunizations, advance directives and screenings you are entitled to yearly through your medicare benefits. Do not miss out on your entitled benefits, this is when medicare will pay for these benefits to be ordered for you.  These are strongly encouraged by your provider and is the appropriate type of visit to make certain you are up to date with all preventive health benefits. If you have not had your medicare wellness exam in the last 12 months, please make certain to schedule one by calling the office and schedule your medicare wellness with Maudie Mercury as soon as possible.   Yearly physical (well visit):  - Adults are recommended to be seen yearly for physicals. Check with your insurance and date of  your last physical, most insurances require one calendar year between physicals. Physicals include all preventive health topics, screenings, medical exam and labs that are appropriate for gender/age and history. You may have fasting labs needed at this visit. This is a well visit (not a sick visit), acute topics should not be covered during this visit.  - Pediatric patients are seen more frequently when they are younger. Your provider will advise you on well child visit timing that is appropriate for your their age. - This is not a medicare wellness visit. Medicare wellness exams do not have an exam portion to the visit. Some medicare companies allow for a physical, some do not allow a yearly physical. If your medicare allows a yearly physical you can schedule the medicare wellness with our nurse Maudie Mercury and have your physical with your provider after, on the same day. Please check with insurance for your full benefits.   Late Policy/No Shows:  - all new patients should arrive 15-30 minutes  earlier than appointment to allow [place] time  to  obtain all personal demographics,  insurance information and for you to complete office paperwork. - All established patients should arrive 10-15 minutes earlier than appointment time to update all information and be checked in .  - In our best efforts to run on time, if you are late for your appointment you will be asked to either reschedule or if able, we will work you back into the schedule. There will be a wait time to work you back in the schedule,  depending on availability.  - If you are unable to make it to your appointment as scheduled, please call 24 hours ahead of time to allow [place] to fill the time slot with someone else who needs to be seen. If you do not cancel your appointment ahead of time, you may be charged a no show fee.

## 2016-10-22 ENCOUNTER — Telehealth: Payer: Self-pay | Admitting: *Deleted

## 2016-10-22 ENCOUNTER — Other Ambulatory Visit: Payer: Self-pay | Admitting: Hematology

## 2016-10-22 DIAGNOSIS — C189 Malignant neoplasm of colon, unspecified: Secondary | ICD-10-CM

## 2016-10-22 MED ORDER — PROCHLORPERAZINE MALEATE 10 MG PO TABS: 10.0000 mg | ORAL_TABLET | Freq: Four times a day (QID) | ORAL | 1 refills | Status: DC | PRN

## 2016-10-22 NOTE — Telephone Encounter (Signed)
I have refilled her compazine, and informed her daughter Jeani Hawking. She will also collect 24hr urine for test.   Truitt Merle MD

## 2016-10-22 NOTE — Telephone Encounter (Signed)
"  This is Rachel Conner calling for my mother.  She has moved to [place] and now needs medicines filled at [facility].  She needs the prochlorperazine 10 mg refilled.  We also need to know if she needs the 24 hour urine checked again on 11-04-2016.  We have a jug.  We are leaving Monday evening going to [facility] [place]. For a relatives graduation.  My return number is [phone] and it is okay to leave a message."

## 2016-11-03 ENCOUNTER — Ambulatory Visit (INDEPENDENT_AMBULATORY_CARE_PROVIDER_SITE_OTHER): Payer: Medicare Other | Admitting: Family Medicine

## 2016-11-03 ENCOUNTER — Encounter: Payer: Self-pay | Admitting: Family Medicine

## 2016-11-03 VITALS — BP 99/64 | HR 73 | Temp 98.4°F | Resp 20 | Ht 64.0 in | Wt 188.0 lb

## 2016-11-03 DIAGNOSIS — I10 Essential (primary) hypertension: Secondary | ICD-10-CM | POA: Diagnosis not present

## 2016-11-03 DIAGNOSIS — F418 Other specified anxiety disorders: Secondary | ICD-10-CM

## 2016-11-03 MED ORDER — AMLODIPINE BESYLATE 10 MG PO TABS: 5.0000 mg | ORAL_TABLET | Freq: Every day | ORAL | 3 refills | Status: DC

## 2016-11-03 MED ORDER — ALPRAZOLAM 0.25 MG PO TABS: ORAL_TABLET | ORAL | 1 refills | Status: DC

## 2016-11-03 MED ORDER — ESCITALOPRAM OXALATE 10 MG PO TABS: 10.0000 mg | ORAL_TABLET | Freq: Every day | ORAL | 0 refills | Status: DC

## 2016-11-03 NOTE — Progress Notes (Signed)
Patient ID: Rachel Conner, female  DOB: 1936/06/03, 81 y.o.   MRN: [id number] Patient Care Team    Relationship Specialty Notifications Start End  Ma Hillock, DO PCP - General Family Medicine  10/13/16   Coralie Keens, MD Consulting Physician General Surgery  05/29/15   Gatha Mayer, MD Consulting Physician Gastroenterology  05/29/15   Josue Hector, MD Consulting Physician Cardiology  05/29/15   Tania Ade, RN Registered Nurse  Admissions 05/29/15    Comment: GI Oncology Nurse Navigator  Truitt Merle, MD Consulting Physician Hematology  10/13/16    Comment: colon cancer   Chief Complaint  Patient presents with  . Depression  . Anxiety    Subjective:  Rachel Conner is a 81 y.o.  female present for follow up on depression  Severe depression/anxiety:  She endorses no great improvement with the start of lexapro 5 mg. Pt desired low dose to start. She feels she does need more coverage. She has needed to continue the xanax 0.25 mg, which discussed ideally tapering off this medication that she has been provided by another physician.  Pt reports fatigue an dizziness, but states this has been ongoing and not a side effect of medicine. She still feel unmotivated and "bored". She again discussed being upset by her family issues. She has joined a church a few weeks back, and reports meeting one new female friend. She has spoken with the pastor at that location once about her issues.   Prior note 10/13/2016: Patient currently on 0.25 mg twice a day of Xanax only. She states that she has been getting the Xanax per her cardiologist. She reports at one time she was on Effexor, Cymbalta and trazodone. She felt the Effexor and Cymbalta causes dizziness. She doesn't recall why she stopped the trazodone but thinks she had side effect. She reports her last Xanax use was approximately 1 week ago because she is running out and she only has one left. She reports increased depression and anxiety  since the loss of her husband suddenly in 2015. Patient doesn't bulge information, however EMR review suggests patient was admitted to behavioral health on February 2017 for major depression. 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 Note states that she was abusing benzodiazepines at that time, taking up to 4 mg of Xanax a day. At that time patient reported taking about he herself. She denies any current SI or HI.  Depression screen [facility] 2/9 11/03/2016 10/13/2016 10/13/2016  Decreased Interest 3 3 0  Down, Depressed, Hopeless 3 3 2   PHQ - 2 Score 6 6 2   Altered sleeping 3 3 1   Tired, decreased energy 3 3 3   Change in appetite 3 3 2   Feeling bad or failure about yourself  3 3 0  Trouble concentrating 3 3 0  Moving slowly or fidgety/restless 3 3 0  Suicidal thoughts 3 3 0  PHQ-9 Score 27 27 8   Difficult doing work/chores - Very difficult Somewhat difficult      GAD 7 : Generalized Anxiety Score 10/13/2016  Nervous, Anxious, on Edge 3  Control/stop worrying 3  Worry too much - different things 2  Trouble relaxing 3  Restless 3  Easily annoyed or irritable 3  Afraid - awful might happen 2  Total GAD 7 Score 19  Anxiety Difficulty Very difficult    There is no immunization history for the selected administration types  on file for this patient.   Past Medical History:  Diagnosis Date  . Anemia   . Anxiety   . Arthritis    Osteoarthrosis  . CAD (coronary artery disease) 1999   CABG  . Cancer [facility])    cancer of the appendix   . Cataract    rt eye per pt  . CHF (congestive heart failure) ([place])   . Colon cancer ([place]) 2017  . Colon polyps   . Confusion, postoperative   . DVT (deep venous thrombosis) ([place])   . GERD (gastroesophageal reflux disease)   . Hemorrhoids   . Hiatal hernia   . HLD (hyperlipidemia)   . HTN (hypertension)   . Hypothyroidism   . Major depressive disorder   . MI (myocardial  infarction) ([place])   . Peripheral neuropathy   . Urinary incontinence    Allergies  Allergen Reactions  . Niacin     Severe flushing  . Nitrogen     Hypotension  . Nitroglycerin     Hypotension  . Myrbetriq [Mirabegron]     Gross hematuria  . Niaspan [Niacin Er]     Severe flushing   Past Surgical History:  Procedure Laterality Date  . CAROTID STENT  07/1998   stent RCA   . CHOLECYSTECTOMY  2011  . COLON SURGERY  03/2015   partial colon to remove cancer in appendix  . COLONOSCOPY W/ BIOPSIES    . CORONARY ARTERY BYPASS GRAFT  1999   6 vessel per pt.  . CORONARY STENT PLACEMENT  10/2001   circumflex stent  . ESOPHAGOGASTRODUODENOSCOPY    . LAPAROSCOPIC APPENDECTOMY N/A 04/11/2015   Procedure: LAPAROSCOPIC APPENDECTOMY;  Surgeon: Coralie Keens, MD;  Location: [facility];  Service: General;  Laterality: N/A;  Laparoscopic converted to open appendectomy   . LUMBAR LAMINECTOMY     fusion 1971  . PARTIAL COLECTOMY N/A 04/28/2015   Procedure: Ileocecectomy;  Surgeon: Coralie Keens, MD;  Location: [facility];  Service: General;  Laterality: N/A;  . Right Thumb Right    Thumb Finger Release  . THYROID SURGERY     Family History  Problem Relation Age of Onset  . Heart disease Father   . Heart disease Mother   . Heart disease Brother   . Cancer Sister     non hodgkins lymphoma  . Heart disease Other     grandfather (side unknown)  . Colon cancer Neg Hx    Social History   Social History  . Marital status: Widowed    Spouse name: N/A  . Number of children: 3  . Years of education: 34   Occupational History  . retired EMT    Social History Main Topics  . Smoking status: Former Smoker    Packs/day: 1.00    Years: 45.00    Quit date: 07/12/1997  . Smokeless tobacco: Never Used     Comment: quit smoking age 59  . Alcohol use No  . Drug use: No  . Sexual activity: No   Other Topics Concern  . Not on file   Social History Narrative   Widowed, lives alone, son and DIL  live close.    Retired from being EMT/med tech   Originally from [place]   Able to perform ADL's independently   [place] a daily vitamin   Wears her seatbelt   Wears dentures   Smoke detector in the home.   Feels safe in her relationships   Allergies as of 11/03/2016  Reactions   Niacin    Severe flushing   Nitrogen    Hypotension   Nitroglycerin    Hypotension   Myrbetriq [mirabegron]    Gross hematuria   Niaspan [niacin Er]    Severe flushing      Medication List       Accurate as of 11/03/16 11:56 AM. Always use your most recent med list.          ALPRAZolam 0.25 MG tablet Commonly known as:  XANAX Once daily PRN   amLODipine 10 MG tablet Commonly known as:  NORVASC Take 0.5 tablets (5 mg total) by mouth daily.   aspirin EC 81 MG tablet Take 81 mg by mouth 3 (three) times a week.   atorvastatin 80 MG tablet Commonly known as:  LIPITOR Take 1 tablet (80 mg total) by mouth daily.   escitalopram 10 MG tablet Commonly known as:  LEXAPRO Take 1 tablet (10 mg total) by mouth daily.   esomeprazole 40 MG capsule Commonly known as:  NEXIUM Take 1 capsule (40 mg total) by mouth daily.   levothyroxine 100 MCG tablet Commonly known as:  SYNTHROID, LEVOTHROID Take 100 mcg by mouth daily.   Loratadine 10 MG Caps Take 1 capsule (10 mg total) by mouth daily.   prochlorperazine 10 MG tablet Commonly known as:  COMPAZINE Take 1 tablet (10 mg total) by mouth every 6 (six) hours as needed for nausea or vomiting.   Rivaroxaban 15 MG Tabs tablet Commonly known as:  XARELTO Take 1 tablet (15 mg total) by mouth daily with supper.   TENSION HEADACHE PO Take 1-2 tablets by mouth daily as needed (headache).        No results found for this or any previous visit (from the past 2160 hour(s)).  Ct Head Wo Contrast  Result Date: 07/22/2016 CLINICAL DATA:  Trip and fall with head injury, initial encounter EXAM: CT HEAD WITHOUT CONTRAST TECHNIQUE: Contiguous axial  images were obtained from the base of the skull through the vertex without intravenous contrast. COMPARISON:  05/09/2015 FINDINGS: Brain: Mild atrophic changes are seen. Chronic white matter ischemic changes noted as well. No findings to suggest acute hemorrhage, acute infarction or space-occupying mass lesion are noted. Vascular: No hyperdense vessel or unexpected calcification. Skull: Normal. Negative for fracture or focal lesion. Sinuses/Orbits: No acute finding. Other: None. IMPRESSION: Chronic atrophic and ischemic changes without acute abnormality. Electronically Signed   By: Inez Catalina M.D.   On: 07/22/2016 13:45   ROS: 14 pt review of systems performed and negative (unless mentioned in an HPI)  Objective: BP 99/64 (BP Location: Left Arm, Patient Position: Sitting, Cuff Size: Large)   Pulse 73   Temp 98.4 F (36.9 C)   Resp 20   Ht 5\' 4"  (1.626 m)   Wt 188 lb (85.3 kg)   SpO2 96%   BMI 32.27 kg/m   Gen: Afebrile. No acute distress.  HENT: AT. [place].  MMM.  Eyes:Pupils Equal Round Reactive to light, Extraocular movements intact,  Conjunctiva without redness, discharge or icterus. CV: RRR, no edema Neuro: Normal gait. PERLA. EOMi. Alert. Oriented.  Psych: talkative. Appears good today. Normal affect, dress and demeanor. Normal speech. Normal thought content and judgment. No SI/HI   Assessment/plan: Rachel Conner is a 81 y.o. female present for depression and anxiety follow up:  Depression with anxiety - Again discussed xanax use. She is to cut back to once daily dosing AS NEEDED ONLY. Will want her to taper off  xanax if able as lexapro increases.  - Given her high depression and anxiety screening, and after she left I was able to review the care everywhere tab which indicated history of severe depression and behavioral health admission, she most likely will need tapered up on Lexapro. Patient had desired to start at low dose. - encouraged her to return to church more routinely  since she enjoyed it and made a friend. She was also encouraged to talk to the pastor (which sounds like she does counseling).   - encouraged patient to become more engaged in social activities. She was provided with resources in the community for silver sneakers, senior volunteers at the hospitals, senior groups/books clubs/activities. Her homework is to join one of these activities by next appt. She is agreeable.  - increase lexapro to 10 mg, f/u 4 weeks if not feeling better on this dose f/u 4 weeks, if doing well f/u 3 months.   Hypertension:  - Still low today and pt with complaints of dizziness and fatigue. She has been tapered back on BP meds by reviewing notes from cardiologist. It repeatedly states her norvasc was cut back, yet pt is taking whole tab (10 mg max dose). - Discussed this with her today and she will cut back to half tab amlodipine and monitoir BP daily/record. If BP > 130/80 routinely after changed she will call in for instructions. If normal with changes she will have a nurse appt next week for BP recheck and documentation.  - diet and exercise discussed. Silver sneakers information was provided to patient.  - F/U routine every 6 months as long as BP normal with changes and she continues to see Dr. Johnsie Cancel between.   Return in about 3 months (around 02/02/2017) for depression .  > 25 minutes spent with patient, >50% of time spent face to face counseling and/or coordinating care.     Electronically signed by: Howard Pouch, DO [place]

## 2016-11-03 NOTE — Patient Instructions (Addendum)
Look at the resources I  Printed out for you and try to join some groups or activities.   Call your pharmacy and get the effexor cancelled.    I am increasing the dose of the lexapro to 10 mg a day. Follow up in 4 weeks if needing higher dose, if doing well on this dose follow up in 3 months.   Decrease your amlodipine (norvasc) to 1/2 tab daily (5 mg total). Take your BP daily at least 2 hours after medication and record. If above 130/80 regularly call in, if remains below continue 5 mg dose (1/2 tab). Come in Monday for nurse BP check (at least 2 hours after medicine).   Silver sneakers:  Classes are at 10:30 a.m. Tuesday through Friday and 1:30 p.m. Monday and Wednesday. For information visit [email or web address].com or www.[facility].org, or call [phone]. Cline Crock is Doctor, general practice at the [facility]. Contact her at [email or web address]  [email or web address]

## 2016-11-04 ENCOUNTER — Ambulatory Visit (HOSPITAL_COMMUNITY)
Admission: RE | Admit: 2016-11-04 | Discharge: 2016-11-04 | Disposition: A | Discharge status: Home / Self Care | Payer: Medicare Other | Source: Ambulatory Visit | Attending: Hematology | Admitting: Hematology

## 2016-11-04 ENCOUNTER — Other Ambulatory Visit (HOSPITAL_BASED_OUTPATIENT_CLINIC_OR_DEPARTMENT_OTHER): Payer: Medicare Other

## 2016-11-04 DIAGNOSIS — C181 Malignant neoplasm of appendix: Secondary | ICD-10-CM

## 2016-11-04 DIAGNOSIS — J439 Emphysema, unspecified: Secondary | ICD-10-CM | POA: Diagnosis not present

## 2016-11-04 DIAGNOSIS — E039 Hypothyroidism, unspecified: Secondary | ICD-10-CM

## 2016-11-04 DIAGNOSIS — I700 Atherosclerosis of aorta: Secondary | ICD-10-CM | POA: Insufficient documentation

## 2016-11-04 DIAGNOSIS — C189 Malignant neoplasm of colon, unspecified: Secondary | ICD-10-CM | POA: Diagnosis present

## 2016-11-04 LAB — COMPREHENSIVE METABOLIC PANEL
ALK PHOS: 51 U/L (ref 40–150)
ALT: 13 U/L (ref 0–55)
AST: 17 U/L (ref 5–34)
Albumin: 3.8 g/dL (ref 3.5–5.0)
Anion Gap: 11 mEq/L (ref 3–11)
BUN: 14.8 mg/dL (ref 7.0–26.0)
CO2: 30 meq/L — AB (ref 22–29)
Calcium: 9.6 mg/dL (ref 8.4–10.4)
Chloride: 102 mEq/L (ref 98–109)
Creatinine: 0.9 mg/dL (ref 0.6–1.1)
EGFR: 65 mL/min/{1.73_m2} — AB (ref >90)
GLUCOSE: 104 mg/dL (ref 70–140)
POTASSIUM: 3.7 meq/L (ref 3.5–5.1)
SODIUM: 143 meq/L (ref 136–145)
Total Bilirubin: 0.6 mg/dL (ref 0.20–1.20)
Total Protein: 7 g/dL (ref 6.4–8.3)

## 2016-11-04 LAB — CBC WITH DIFFERENTIAL/PLATELET
BASO%: 0.2 % (ref 0.0–2.0)
Basophils Absolute: 0 10*3/uL (ref 0.0–0.1)
EOS ABS: 0.2 10*3/uL (ref 0.0–0.5)
EOS%: 2.5 % (ref 0.0–7.0)
HCT: 39.8 % (ref 34.8–46.6)
HEMOGLOBIN: 12.8 g/dL (ref 11.6–15.9)
LYMPH%: 23.9 % (ref 14.0–49.7)
MCH: 29.2 pg (ref 25.1–34.0)
MCHC: 32.2 g/dL (ref 31.5–36.0)
MCV: 90.7 fL (ref 79.5–101.0)
MONO#: 0.7 10*3/uL (ref 0.1–0.9)
MONO%: 7.8 % (ref 0.0–14.0)
NEUT%: 65.6 % (ref 38.4–76.8)
NEUTROS ABS: 6.1 10*3/uL (ref 1.5–6.5)
Platelets: 314 10*3/uL (ref 145–400)
RBC: 4.39 10*6/uL (ref 3.70–5.45)
RDW: 13.5 % (ref 11.2–14.5)
WBC: 9.3 10*3/uL (ref 3.9–10.3)
lymph#: 2.2 10*3/uL (ref 0.9–3.3)

## 2016-11-04 LAB — CEA (IN HOUSE-CHCC): CEA (CHCC-IN HOUSE): 3.08 ng/mL (ref 0.00–5.00)

## 2016-11-04 MED ORDER — IOPAMIDOL (ISOVUE-300) INJECTION 61%
INTRAVENOUS | Status: AC
  Administered 2016-11-04: 100 mL
  Filled 2016-11-04: qty 100

## 2016-11-08 LAB — 5 HIAA, QUANTITATIVE, URINE, 24 HOUR
5-HIAA, URINE: 1.2 mg/L
5-HIAA,Quant.,24 Hr Urine: 1.8 mg/24 hr (ref 0.0–14.9)

## 2016-11-09 ENCOUNTER — Telehealth: Payer: Self-pay | Admitting: Family Medicine

## 2016-11-09 ENCOUNTER — Ambulatory Visit (INDEPENDENT_AMBULATORY_CARE_PROVIDER_SITE_OTHER): Payer: Medicare Other | Admitting: Family Medicine

## 2016-11-09 VITALS — BP 110/56 | HR 75

## 2016-11-09 DIAGNOSIS — I10 Essential (primary) hypertension: Secondary | ICD-10-CM | POA: Diagnosis not present

## 2016-11-09 LAB — CHROMOGRANIN A: CHROMOGRAN A: 5 nmol/L (ref 0–5)

## 2016-11-09 NOTE — Telephone Encounter (Signed)
Pt presented today for Nurse visit BP Check, during this visit patient informed me that she is in a lot of pain due to what she believes to be UTI symptoms.   I spoke with Vinnie Level and advised patient that she would need to be seen in office for acute appointment with Dr Raoul Pitch in order for urine test.  Patient states she will just wait to see what her oncologist will do for her since she has an appointment with her later this week.  I gave patient options for appointment this afternoon and tomorrow but patient was uninterested.

## 2016-11-09 NOTE — Telephone Encounter (Signed)
Per Dr Raoul Pitch patient needs appt for evaluation. She was offered several appt times by Lattie Haw and declined at this time. Dr Raoul Pitch aware.

## 2016-11-09 NOTE — Progress Notes (Addendum)
Patient present today to have BP check per Dr Raoul Pitch OV note 11/03/16.   Patient BP reading today after sitting in a quiet room x 5 minutes is 110/56 Pulse of 75.   Medical screening examination/treatment/procedure(s) were performed by non-physician practitioner and as supervising physician I was immediately available for consultation/collaboration.  I agree with above assessment and plan.  Electronically Signed by: Howard Pouch, DO [place] [facility] [place]

## 2016-11-10 NOTE — Progress Notes (Signed)
[name]  [phone] [phone]  Clinic Follow Up Note   Patient Care Team: Ma Hillock, DO as PCP - General (Family Medicine) Coralie Keens, MD as Consulting Physician (General Surgery) Gatha Mayer, MD as Consulting Physician (Gastroenterology) Josue Hector, MD as Consulting Physician (Cardiology) Tania Ade, RN as Registered Nurse Truitt Merle, MD as Consulting Physician (Hematology) 11/11/2016   CHIEF COMPLAINTS:  Follow up mixed carcinoids-adenocarcinoma of appendix  HISTORY OF PRESENTING ILLNESS (05/29/2015):  Rachel Conner 81 y.o. 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 She is accompanied by her son to the clinic today. She came in a wheelchair.  She presented with abdominal pain, intermittent nausea and vomiting, diarrhea, for a few months. She was seen at [facility] emergency room in September, and CT of abdomen and pelvis revealed dilated appendix. She was referred to general surgeon Dr. Belenda Cruise, who brought her to OR on 04/11/2015 for appendectomy. She tolerated surgery well. The surgical path reviewed mixed carcinoid and adenocarcinoma, with area of small perforation. She was brought back to the OR on 04/28/2015, and underwent right hemicolectomy.  She has been recovering well from her surgery. She still feels quite fatigued, she is able to do most ADLs, but not much other activities. She has physical therapist and occupational therapist coming to her house twice a week. Her appetite has improved, she is eating better. Her pain incision has been much improved.  CURRENT THERAPY:  surveillance  INTERIM HISTORY Ms. Walstad returns for follow-up. She is accompanied by her daughter-in-law to the clinic today. The patient reports she feels well. She has not been taking her iron pill recently. She reports  Intermittent nausea for which she takes Compazine twice a day, and abdominal  bloating, for which she occasionally takes Miralax with relief. She denies constipation. She reports she started Lexapro 5 weeks ago per her PCP; her family reports seeing a difference in her mood and outlook. The patient reports urinary incontinence and dysuria ongoing several weeks. She has recently been treated with antibiotics for UTI.    MEDICAL HISTORY:  Past Medical History:  Diagnosis Date  . Anemia   . Anxiety   . Arthritis    Osteoarthrosis  . CAD (coronary artery disease) 1999   CABG  . Cancer [facility])    cancer of the appendix   . Cataract    rt eye per pt  . CHF (congestive heart failure) ([place])   . Colon cancer ([place]) 2017  . Colon polyps   . Confusion, postoperative   . DVT (deep venous thrombosis) ([place])   . GERD (gastroesophageal reflux disease)   . Hemorrhoids   . Hiatal hernia   . HLD (hyperlipidemia)   . HTN (hypertension)   . Hypothyroidism   . Major depressive disorder   . MI (myocardial infarction) ([place])   . Peripheral neuropathy   . Urinary incontinence     SURGICAL HISTORY: Past Surgical History:  Procedure Laterality Date  . CAROTID STENT  07/1998   stent RCA   . CHOLECYSTECTOMY  2011  . COLON SURGERY  03/2015   partial colon to remove cancer in appendix  . COLONOSCOPY W/ BIOPSIES    . CORONARY ARTERY BYPASS GRAFT  1999   6 vessel per pt.  . CORONARY STENT PLACEMENT  10/2001   circumflex stent  . ESOPHAGOGASTRODUODENOSCOPY    . LAPAROSCOPIC APPENDECTOMY N/A 04/11/2015   Procedure: LAPAROSCOPIC APPENDECTOMY;  Surgeon: Coralie Keens, MD;  Location: [facility] OR;  Service: General;  Laterality: N/A;  Laparoscopic converted to open appendectomy   . LUMBAR LAMINECTOMY     fusion 1971  . PARTIAL COLECTOMY N/A 04/28/2015   Procedure: Ileocecectomy;  Surgeon: Coralie Keens, MD;  Location: [facility];  Service: General;  Laterality: N/A;  . Right Thumb Right    Thumb Finger Release  . THYROID SURGERY      SOCIAL HISTORY: Social History   Social History    . Marital status: Widowed    Spouse name: N/A  . Number of children: 3  . Years of education: 23   Occupational History  . retired EMT    Social History Main Topics  . Smoking status: Former Smoker    Packs/day: 1.00    Years: 45.00    Quit date: 07/12/1997  . Smokeless tobacco: Never Used     Comment: quit smoking age 67  . Alcohol use No  . Drug use: No  . Sexual activity: No   Other Topics Concern  . Not on file   Social History Narrative   Widowed, lives alone, son and DIL live close.    Retired from being EMT/med tech   Originally from [place]   Able to perform ADL's independently   [place] a daily vitamin   Wears her seatbelt   Wears dentures   Smoke detector in the home.   Feels safe in her relationships    FAMILY HISTORY: Family History  Problem Relation Age of Onset  . Heart disease Father   . Heart disease Mother   . Heart disease Brother   . Cancer Sister     non hodgkins lymphoma  . Heart disease Other     grandfather (side unknown)  . Colon cancer Neg Hx     ALLERGIES:  is allergic to niacin; nitrogen; nitroglycerin; myrbetriq [mirabegron]; and niaspan [niacin er].  MEDICATIONS:  Current Outpatient Prescriptions  Medication Sig Dispense Refill  . Acetaminophen-Caffeine (TENSION HEADACHE PO) Take 1-2 tablets by mouth daily as needed (headache).    . ALPRAZolam (XANAX) 0.25 MG tablet Once daily PRN 30 tablet 1  . amLODipine (NORVASC) 10 MG tablet Take 0.5 tablets (5 mg total) by mouth daily. 90 tablet 3  . aspirin EC 81 MG tablet Take 81 mg by mouth 3 (three) times a week.     Marland Kitchen atorvastatin (LIPITOR) 80 MG tablet Take 1 tablet (80 mg total) by mouth daily. 90 tablet 3  . escitalopram (LEXAPRO) 10 MG tablet Take 1 tablet (10 mg total) by mouth daily. 90 tablet 0  . esomeprazole (NEXIUM) 40 MG capsule Take 1 capsule (40 mg total) by mouth daily. 90 capsule 3  . levothyroxine (SYNTHROID, LEVOTHROID) 100 MCG tablet Take 100 mcg by mouth daily.      .  Loratadine 10 MG CAPS Take 1 capsule (10 mg total) by mouth daily. 90 each 3  . Melatonin 5 MG TABS Take 10 mg by mouth at bedtime.    . prochlorperazine (COMPAZINE) 10 MG tablet Take 1 tablet (10 mg total) by mouth every 8 (eight) hours as needed for nausea or vomiting. 60 tablet 5  . Rivaroxaban (XARELTO) 15 MG TABS tablet Take 1 tablet (15 mg total) by mouth daily with supper. 90 tablet 3   No current facility-administered medications for this visit.     REVIEW OF SYSTEMS:   Constitutional: Denies fevers, chills or abnormal night sweats Eyes: Denies blurriness of vision, double vision or watery eyes Ears, nose,  mouth, throat, and face: Denies mucositis or sore throat Respiratory: Denies cough, dyspnea or wheezes Cardiovascular: Denies palpitation, chest discomfort or lower extremity swelling Gastrointestinal:  Denies heartburn, (+) nausea, (+) bloating, (+) urinary incontinence Skin: Denies abnormal skin rashes Lymphatics: Denies new lymphadenopathy or easy bruising Neurological:Denies numbness, tingling or new weaknesses Behavioral/Psych: Mood is stable, no new changes  All other systems were reviewed with the patient and are negative.  PHYSICAL EXAMINATION: ECOG PERFORMANCE STATUS: 1  Vitals:   11/11/16 1259  BP: (!) 128/57  Pulse: 82  Temp: 98.2 F (36.8 C)   Filed Weights   11/11/16 1259  Weight: 191 lb 1.6 oz (86.7 kg)    GENERAL:alert, no distress and comfortable SKIN: skin color, texture, turgor are normal, no rashes or significant lesions EYES: normal, conjunctiva are pink and non-injected, sclera clear OROPHARYNX:no exudate, no erythema and lips, buccal mucosa, and tongue normal  NECK: supple, thyroid normal size, non-tender, without nodularity LYMPH:  no palpable lymphadenopathy in the cervical, axillary or inguinal LUNGS: clear to auscultation and percussion with normal breathing effort HEART: regular rate & rhythm and no murmurs and no lower extremity  edema ABDOMEN:abdomen soft, non-tender. No organomegaly. (+) surgical incision sites are healing well, (+) sluggish bowels Musculoskeletal:no cyanosis of digits and no clubbing  PSYCH: alert & oriented x 3 with fluent speech NEURO: no focal motor/sensory deficits  LABORATORY DATA:  I have reviewed the data as listed CBC Latest Ref Rng & Units 11/04/2016 08/02/2016 07/22/2016  WBC 3.9 - 10.3 10e3/uL 9.3 8.7 10.4  Hemoglobin 11.6 - 15.9 g/dL 12.8 13.9 13.7  Hematocrit 34.8 - 46.6 % 39.8 42.4 41.1  Platelets 145 - 400 10e3/uL 314 298 300.0    CMP Latest Ref Rng & Units 11/04/2016 08/02/2016 04/16/2016  Glucose 70 - 140 mg/dl 104 112 124  BUN 7.0 - 26.0 mg/dL 14.8 10.9 16.5  Creatinine 0.6 - 1.1 mg/dL 0.9 0.8 0.8  Sodium 136 - 145 mEq/L 143 141 142  Potassium 3.5 - 5.1 mEq/L 3.7 3.2(L) 3.8  Chloride 101 - 111 mmol/L - - -  CO2 22 - 29 mEq/L 30(H) 29 29  Calcium 8.4 - 10.4 mg/dL 9.6 9.8 9.3  Total Protein 6.4 - 8.3 g/dL 7.0 6.6 6.7  Total Bilirubin 0.20 - 1.20 mg/dL 0.60 0.87 <0.30  Alkaline Phos 40 - 150 U/L 51 49 58  AST 5 - 34 U/L 17 16 16   ALT 0 - 55 U/L 13 16 14    Chromogranin A (0-5 nmol/L) 04/16/2016: 9 08/02/2016: 2  24-urine 5 HIAA (0-15mg /24h) 04/16/2016: 2.2   CEA 04/16/2016: 3.8 08/02/2016: 3.6 11/04/2016: 3.08  Results for AZALEE, WEIMER (MRN [id number]) as of 04/23/2016 06:49  Ref. Range 06/19/2015 09:30 03/11/2016 15:26 04/16/2016 11:30  Iron Latest Ref Range: 41 - 142 ug/dL 23 (L)  43  UIBC Latest Ref Range: 120 - 384 ug/dL 345  297  TIBC Latest Ref Range: 236 - 444 ug/dL 368  340  %SAT Latest Ref Range: 21 - 57 % 6 (L)  13 (L)  Ferritin Latest Ref Range: 9 - 269 ng/ml 20 8.6 (L) 22    PATHOLOGY REPORT  Diagnosis 04/28/2015 Colon, segmental resection for tumor, Distal illium and cecum - MIXED GOBLET CELL CARCINOID/CARCINOMA-ADENOCARCINOMA, SEE COMMENT. - TUMOR INVOLVES APPENDICEAL OS. - TUMOR INVOLVES FIBROFATTY SOFT TISSUE. AROUND THE APPENDICEAL OS - ONE LYMPH  NODE, POSITIVE FOR METASTATIC TUMOR (1/5). - SURGICAL MARGINS, NEGATIVE FOR TUMOR. Microscopic Comment The previous appendectomy demonstrating mixed goblet cell carcinoid/carcinoma-adenocarcinoma is  noted ([id number]). Please see previous case for additional tumor information. (CRR:ecj 04/29/2015)  Diagnosis 04/11/2015 Appendix, Other than Incidental - MIXED GOBLET CELL CARCINOID-ADENOCARCINOMA, GRADE 3, SPANNING 4.5 CM. - TUMOR INVADES THROUGH SEROSA. - PERINEURAL INVASION PRESENT. - RESECTION MARGIN IS POSITIVE. - SEE ONCOLOGY TABLE. Microscopic Comment APPENDIX: Specimen: Appendix. Procedure: Appendectomy. Specimen Integrity: Disrupted.  Specimen Size: 4.5 x 3.0 x 1.9 cm. Tumor Site: Diffuse involving appendix. Tumor Size: Approximately 4.5 cm. Histologic Type: Mixed goblet cell carcinoma-adenocarcinoma. Histologic Grade: Grade 3 (poorly differentiated) Microscopic Tumor Extension: Through serosa. Margins: Proximal Margin: Involved. Mesenteric Margin: N/A. Adenoma present at proximal margin: No. Lymph-Vascular Invasion: Not identified. Perineural Invasion: Present. Peritumoral Nodules (tumor deposits): Not identified. Lymph nodes: number examined 0; number positive: 0 TNM: pT4, pNX Ancillary studies: Can be performed upon request. Comments: The tumor consists of infiltrating nests and single goblet cells. Additionally there are areas of conventional gland forming adenocarcinoma. Immunohistochemistry reveals the goblet cells are positive for synaptophysin, chromogranin, and CD56. The tumor diffusely involves the appendix from the tip to the resection margin and is thus estimated at 4.5 cm in size. There is an area of possible perforation, although this is difficult to assess with the disrupted nature of the specimen. Tumor invades through the serosa. Dr. Donato Heinz has reviewed the case. The case was called to Dr. Ninfa Linden on 04/15/2015.   RADIOGRAPHIC STUDIES: I have personally  reviewed the radiological images as listed and agreed with the findings in the report. Ct Abdomen Pelvis W Contrast  Result Date: 11/04/2016 CLINICAL DATA:  Mix carcinoid adenocarcinoma of the colon. EXAM: CT ABDOMEN AND PELVIS WITH CONTRAST TECHNIQUE: Multidetector CT imaging of the abdomen and pelvis was performed using the standard protocol following bolus administration of intravenous contrast. CONTRAST:  1 ISOVUE-300 IOPAMIDOL (ISOVUE-300) INJECTION 61% COMPARISON:  12/11/2015 FINDINGS: Lower chest:  Emphysema. Hepatobiliary: No focal abnormality within the liver parenchyma. Gallbladder surgically absent. No intrahepatic or extrahepatic biliary dilation. Pancreas: No focal mass lesion. No dilatation of the main duct. No intraparenchymal cyst. No peripancreatic edema. Spleen: No splenomegaly. No focal mass lesion. Adrenals/Urinary Tract: No adrenal nodule or mass. Kidneys unremarkable. No evidence for hydroureter. The urinary bladder appears normal for the degree of distention. Stomach/Bowel: Stomach is nondistended. No gastric wall thickening. No evidence of outlet obstruction. Duodenum is normally positioned as is the ligament of Treitz. No small bowel wall thickening. No small bowel dilatation. The terminal ileum is normal. Status post ileocecectomy. No gross colonic mass. No colonic wall thickening. No substantial diverticular change. Vascular/Lymphatic: There is abdominal aortic atherosclerosis without aneurysm. There is no gastrohepatic or hepatoduodenal ligament lymphadenopathy. No intraperitoneal or retroperitoneal lymphadenopathy. No pelvic sidewall lymphadenopathy. Reproductive: The uterus has normal CT imaging appearance. There is no adnexal mass. Other: No intraperitoneal free fluid. Musculoskeletal: Bone windows reveal no worrisome lytic or sclerotic osseous lesions. IMPRESSION: 1. Stable exam.  No evidence for recurrent or metastatic disease. 2.  Abdominal Aortic Atherosclerois (ICD10-170.0) 3.   Emphysema. ([id number]) Electronically Signed   By: Misty Stanley M.D.   On: 11/04/2016 16:59    ASSESSMENT & PLAN:  81 y.o. Caucasian female with:  1. Mixed goblet cell carcinoid-adenocarcinoma of appendix, pT4aN1Mx, at least stage IIIB -I previously reviewed her surgical pathology results with her in great details. The stage was also discussed with her. -we previously discussed that appendiceal mixed carcinoid and adenocarcinoma is rare type tumor, and we do not have large body of clinical trial data to guild [place] about the adjuvant chemotherapy and surveillance. -due  to the component of adenocarcinoma, and advanced stage, high risk features including a T4 disease, positive nodes, and possible appendiceal perforation, I do think her risk of recurrence is high. -Patient previously declined adjuvant chemotherapy.  -She is clinically doing well, exam unremarkable. I reviewed her lab test results from last week, which showed normal CBC, CMP, CEA, chromogranin A and a 24-hour urine HIAA.  -Her recent screening colonoscopy in 02/2016 was negative -No clinical concern for recurrence. - Reviewed recent CT scan results from 11/04/16 with the patient and her family today; no evidence of recurrent or metastatic disease. Labs reviewed. -Continue surveillance, for total 5 years. We'll see her back in 6 months.  2. Iron deficient anemia secondary to blood loss  -Resolved -Her previous colonoscopy was negative - She has previously stopped taking oral iron pill - Hgb 12.8 today, 11/11/16 - I encouraged the patient to continue OTC oral iron.  3. HTN, hypothyroidism, CHF -She'll continue follow-up with her primary care physician  4. Depression  -She lives alone, socially isolated -I encouraged her to be more active.  -I encouraged her to follow-up with her primary care physician and discuss antidepressant medication. - the patient started Lexapro 5 weeks ago  5. Anorexia and nausea  -Possibly related to  depression -I refilled her Compazine today 11/11/16, she will use as needed. -I encouraged her to follow-up with Dr. Carlean Purl if needed  6. Abdominal Bloating - The patient experiences bloating on and off - I encouraged the patient to use Miralax regularly to control this  7. Potential UTI - The patient has dysuria ongoing for several weeks. - She reports being treated with antibiotics for UTI recently, without relief. - I will collect a urine specimen today and call the patient with results. I will also send the results to the patient's PCP, Dr. Raoul Pitch.  Plan - Reviewed recent CT scan results today. - Urine culture today. - Follow up with PCP as needed for medication management. - Refill Compazine. - Follow up in 6 months with labs.  All questions were answered. The patient knows to call the clinic with any problems, questions or concerns.  I spent 25 minutes counseling the patient face to face. The total time spent in the appointment was 30 minutes and more than 50% was on counseling.  This document serves as a record of services personally performed by Truitt Merle, MD. It was created on her behalf by Maryla Morrow, a trained medical scribe. The creation of this record is based on the scribe's personal observations and the provider's statements to them. This document has been checked and approved by the attending provider.    Truitt Merle, MD 11/11/2016

## 2016-11-11 ENCOUNTER — Telehealth: Payer: Self-pay | Admitting: Hematology

## 2016-11-11 ENCOUNTER — Other Ambulatory Visit (HOSPITAL_BASED_OUTPATIENT_CLINIC_OR_DEPARTMENT_OTHER): Payer: Medicare Other | Admitting: *Deleted

## 2016-11-11 ENCOUNTER — Ambulatory Visit (HOSPITAL_BASED_OUTPATIENT_CLINIC_OR_DEPARTMENT_OTHER): Payer: Medicare Other | Admitting: Hematology

## 2016-11-11 ENCOUNTER — Ambulatory Visit: Payer: Medicare Other

## 2016-11-11 ENCOUNTER — Encounter: Payer: Self-pay | Admitting: Hematology

## 2016-11-11 VITALS — BP 128/57 | HR 82 | Temp 98.2°F | Ht 64.0 in | Wt 191.1 lb

## 2016-11-11 DIAGNOSIS — R300 Dysuria: Secondary | ICD-10-CM

## 2016-11-11 DIAGNOSIS — Z85038 Personal history of other malignant neoplasm of large intestine: Secondary | ICD-10-CM | POA: Diagnosis present

## 2016-11-11 DIAGNOSIS — I10 Essential (primary) hypertension: Secondary | ICD-10-CM

## 2016-11-11 DIAGNOSIS — C189 Malignant neoplasm of colon, unspecified: Secondary | ICD-10-CM

## 2016-11-11 DIAGNOSIS — I509 Heart failure, unspecified: Secondary | ICD-10-CM | POA: Diagnosis not present

## 2016-11-11 DIAGNOSIS — E039 Hypothyroidism, unspecified: Secondary | ICD-10-CM

## 2016-11-11 LAB — URINALYSIS, MICROSCOPIC - CHCC
BLOOD: NEGATIVE
GLUCOSE UR CHCC: NEGATIVE mg/dL
Ketones: NEGATIVE mg/dL
NITRITE: NEGATIVE
PH: 6 (ref 4.6–8.0)
Protein: <30 mg/dL
Specific Gravity, Urine: 1.02 (ref 1.003–1.035)
Urobilinogen, UR: 0.2 mg/dL (ref 0.2–1)

## 2016-11-11 MED ORDER — PROCHLORPERAZINE MALEATE 10 MG PO TABS: 10.0000 mg | ORAL_TABLET | Freq: Three times a day (TID) | ORAL | 5 refills | Status: DC | PRN

## 2016-11-11 NOTE — Telephone Encounter (Signed)
Gave patient AVS and calender per 5/3 los.  

## 2016-11-15 LAB — URINE CULTURE

## 2016-11-22 ENCOUNTER — Other Ambulatory Visit: Payer: Self-pay | Admitting: Cardiovascular Disease

## 2016-11-23 ENCOUNTER — Telehealth: Payer: Self-pay

## 2016-11-23 ENCOUNTER — Telehealth: Payer: Self-pay | Admitting: *Deleted

## 2016-11-23 NOTE — Telephone Encounter (Signed)
-----   Message from Truitt Merle, MD sent at 11/21/2016  8:37 PM EDT ----- Please call pt and let her know her urine culture was negative from 2 weeks ago, sorry for the late report.Thanks.   Truitt Merle  11/21/2016

## 2016-11-23 NOTE — Telephone Encounter (Signed)
Pt is on Xarelto 15mg  QD, but based on pt's CrCl 70.51, age 81, weight 86.7kg pt should be on Xarelto 20mg  QD.  Pt has been maintained on Xarelto 15mg  QD since 06/2013 when started, despite CrCl at that time of 75.88. Pt was discharged form hospital on Xarelto 20mg  QD in 10/2015, but when pt saw Dr Johnsie Cancel in office on 11/20/15 pt was switched back to Xarelto 15mg  QD, which based on pt's CrCl is not appropriate dosing, however no definite explanation in OV note as to why this decrease in dosage was made.  Please advise if you would like for pt's Xarelto to be increased to 20mg  QD which is appropriate dosing based on pt's CrCl of 70.51 at present.

## 2016-11-23 NOTE — Telephone Encounter (Signed)
See rx refill, Xarelto 15mg  tablets refilled per Dr Johnsie Cancel appropriate dosing secondary to increased fall risk.

## 2016-11-23 NOTE — Telephone Encounter (Signed)
Called pt and left message on voice mail re: Urine culture from 2 weeks ago was negative as per Dr. Ernestina Penna instructions.  Left message encouraging pt to increase po fluids including cranberry juice.

## 2016-11-23 NOTE — Telephone Encounter (Deleted)
Pt is on Xarelto 15mg  QD, but based on pt's CrCl 70.51, age 81, weight 86.7kg pt should be on Xarelto 20mg  QD.  Pt has been maintained on Xarelto 15mg  QD since 06/2013 when started, despite CrCl at that time of 75.88. Pt was discharged form hospital on Xarelto 20mg  QD in 10/2015, but when pt saw Dr Johnsie Cancel in office on 11/20/15 pt was switched back to Xarelto 15mg  QD, which based on pt's CrCl is not appropriate dosing, however no definite explanation in OV note as to why this decrease in dosage was made.  Please advise if you would like for pt's Xarelto to be increased to 20mg  QD which is appropriate dosing based on pt's CrCl of 70.51 at present.

## 2016-11-23 NOTE — Telephone Encounter (Signed)
Will refill Xarelto 15mg  QD per Dr Johnsie Cancel secondary to fall risk.  Pt last saw Dr Johnsie Cancel 09/07/16, last labs on 11/04/16 Creat 0.9.

## 2016-11-23 NOTE — Telephone Encounter (Signed)
-----   Message from Josue Hector, MD sent at 11/23/2016 10:34 AM EDT ----- We have her on lower dose due to fall risk  ----- Message ----- From: Brynda Peon, RN Sent: 11/23/2016   9:41 AM To: Josue Hector, MD  Pt is on Xarelto 15mg  QD, but based on pt's CrCl 70.51, age 81, weight 86.7kg pt should be on Xarelto 20mg  QD.  Pt has been maintained on Xarelto 15mg  QD since 06/2013 when started, despite CrCl at that time of 75.88. Pt was discharged form hospital on Xarelto 20mg  QD in 10/2015, but when pt saw Dr Johnsie Cancel in office on 11/20/15 pt was switched back to Xarelto 15mg  QD, which based on pt's CrCl is not appropriate dosing, however no definite explanation in OV note as to why this decrease in dosage was made.  Please advise if you would like for pt's Xarelto to be increased to 20mg  QD which is appropriate dosing based on pt's CrCl of 70.51 at present.

## 2017-01-17 ENCOUNTER — Telehealth: Payer: Self-pay | Admitting: Cardiovascular Disease

## 2017-01-17 NOTE — Telephone Encounter (Signed)
Pt is calling requesting a refill on levothyroxine. Would you like to refill this medication? Please advise

## 2017-01-20 NOTE — Telephone Encounter (Signed)
Called pt and left a message informing pt per Olin Hauser, RN, Dr. Kyla Balzarine nurse, for pt to refill her levothyroxine with her PCP and if she has any other problems, questions or concerns to call our office.

## 2017-02-02 ENCOUNTER — Encounter: Payer: Self-pay | Admitting: Family Medicine

## 2017-02-02 ENCOUNTER — Telehealth: Payer: Self-pay | Admitting: Family Medicine

## 2017-02-02 ENCOUNTER — Ambulatory Visit (INDEPENDENT_AMBULATORY_CARE_PROVIDER_SITE_OTHER): Payer: Medicare Other | Admitting: Family Medicine

## 2017-02-02 VITALS — BP 101/69 | HR 78 | Temp 97.6°F | Resp 20 | Ht 64.0 in | Wt 181.5 lb

## 2017-02-02 DIAGNOSIS — Z79899 Other long term (current) drug therapy: Secondary | ICD-10-CM | POA: Diagnosis not present

## 2017-02-02 DIAGNOSIS — I119 Hypertensive heart disease without heart failure: Secondary | ICD-10-CM | POA: Diagnosis not present

## 2017-02-02 DIAGNOSIS — F418 Other specified anxiety disorders: Secondary | ICD-10-CM

## 2017-02-02 DIAGNOSIS — E038 Other specified hypothyroidism: Secondary | ICD-10-CM

## 2017-02-02 LAB — TSH: TSH: 2.3 u[IU]/mL (ref 0.35–4.50)

## 2017-02-02 MED ORDER — LEVOTHYROXINE SODIUM 100 MCG PO TABS: 100.0000 ug | ORAL_TABLET | Freq: Every day | ORAL | 0 refills | Status: DC

## 2017-02-02 MED ORDER — LEVOTHYROXINE SODIUM 100 MCG PO TABS: 100.0000 ug | ORAL_TABLET | Freq: Every day | ORAL | 3 refills | Status: DC

## 2017-02-02 MED ORDER — LORAZEPAM 0.5 MG PO TABS: 0.5000 mg | ORAL_TABLET | Freq: Two times a day (BID) | ORAL | 5 refills | Status: DC | PRN

## 2017-02-02 MED ORDER — ESCITALOPRAM OXALATE 20 MG PO TABS: 20.0000 mg | ORAL_TABLET | Freq: Every day | ORAL | 1 refills | Status: DC

## 2017-02-02 NOTE — Telephone Encounter (Signed)
Please call patient, her thyroid level is normal. I have refilled her thyroid medication through her mail in pharmacy today, with refills for one year.

## 2017-02-02 NOTE — Patient Instructions (Addendum)
Ativan in place in xanax today. Start with a half tab every 12 hours as needed. You can take a full tab every 12 hours ONLY if  Needed. You must be seen by me every 6 months to continue medication, no refills over phone permitted by law   lexapro daily at 20 mg a day. Follow up in 6 months.    It was great to see you today.  Please help [place] help you:  We are honored you have chosen [facility] for your Primary Care home. Below you will find basic instructions that you may need to access in the future. Please help [place] help you by reading the instructions, which cover many of the frequent questions we experience.   Prescription refills and request:  -In order to allow more efficient response time, please call your pharmacy for all refills. They will forward the request electronically to [place]. This allows for the quickest possible response. Request left on a nurse line can take longer to refill, since these are checked as time allows between office patients and other phone calls.  - refill request can take up to 3-5 working days to complete.  - If request is sent electronically and request is appropiate, it is usually completed in 1-2 business days.  - all patients will need to be seen routinely for all chronic medical conditions requiring prescription medications (see follow-up below). If you are overdue for follow up on your condition, you will be asked to make an appointment and we will call in enough medication to cover you until your appointment (up to 30 days).  - all controlled substances will require a face to face visit to request/refill.  - if you desire your prescriptions to go through a new pharmacy, and have an active script at original pharmacy, you will need to call your pharmacy and have scripts transferred to new pharmacy. This is completed between the pharmacy locations and not by your provider.    Results: If any images or labs were ordered, it can take up to 1 week to get  results depending on the test ordered and the lab/facility running and resulting the test. - Normal or stable results, which do not need further discussion, may be released to your mychart immediately with attached note to you. A call may not be generated for normal results. Please make certain to sign up for mychart. If you have questions on how to activate your mychart you can call the front office.  - If your results need further discussion, our office will attempt to contact you via phone, and if unable to reach you after 2 attempts, we will release your abnormal result to your mychart with instructions.  - All results will be automatically released in mychart after 1 week.  - Your provider will provide you with explanation and instruction on all relevant material in your results. Please keep in mind, results and labs may appear confusing or abnormal to the untrained eye, but it does not mean they are actually abnormal for you personally. If you have any questions about your results that are not covered, or you desire more detailed explanation than what was provided, you should make an appointment with your provider to do so.   Our office handles many outgoing and incoming calls daily. If we have not contacted you within 1 week about your results, please check your mychart to see if there is a message first and if not, then contact our office.  In helping with this matter, you help decrease call volume, and therefore allow [place] to be able to respond to patients needs more efficiently.   Acute office visits (sick visit):  An acute visit is intended for a new problem and are scheduled in shorter time slots to allow schedule openings for patients with new problems. This is the appropriate visit to discuss a new problem. In order to provide you with excellent quality medical care with proper time for you to explain your problem, have an exam and receive treatment with instructions, these appointments should  be limited to one new problem per visit. If you experience a new problem, in which you desire to be addressed, please make an acute office visit, we save openings on the schedule to accommodate you. Please do not save your new problem for any other type of visit, let us take care of it properly and quickly for you.   Follow up visits:  Depending on your condition(s) your provider will need to see you routinely in order to provide you with quality care and prescribe medication(s). Most chronic conditions (Example: hypertension, Diabetes, depression/anxiety... etc), require visits a couple times a year. Your provider will instruct you on proper follow up for your personal medical conditions and history. Please make certain to make follow up appointments for your condition as instructed. Failing to do so could result in lapse in your medication treatment/refills. If you request a refill, and are overdue to be seen on a condition, we will always provide you with a 30 day script (once) to allow you time to schedule.    Medicare wellness (well visit): - we have a wonderful Nurse Maudie Mercury), that will meet with you and provide you will yearly medicare wellness visits. These visits should occur yearly (can not be scheduled less than 1 calendar year apart) and cover preventive health, immunizations, advance directives and screenings you are entitled to yearly through your medicare benefits. Do not miss out on your entitled benefits, this is when medicare will pay for these benefits to be ordered for you.  These are strongly encouraged by your provider and is the appropriate type of visit to make certain you are up to date with all preventive health benefits. If you have not had your medicare wellness exam in the last 12 months, please make certain to schedule one by calling the office and schedule your medicare wellness with Maudie Mercury as soon as possible.   Yearly physical (well visit):  - Adults are recommended to be seen  yearly for physicals. Check with your insurance and date of your last physical, most insurances require one calendar year between physicals. Physicals include all preventive health topics, screenings, medical exam and labs that are appropriate for gender/age and history. You may have fasting labs needed at this visit. This is a well visit (not a sick visit), new problems should not be covered during this visit (see acute visit).  - Pediatric patients are seen more frequently when they are younger. Your provider will advise you on well child visit timing that is appropriate for your their age. - This is not a medicare wellness visit. Medicare wellness exams do not have an exam portion to the visit. Some medicare companies allow for a physical, some do not allow a yearly physical. If your medicare allows a yearly physical you can schedule the medicare wellness with our nurse Maudie Mercury and have your physical with your provider after, on the same day. Please check with insurance for  your full benefits.   Late Policy/No Shows:  - all new patients should arrive 15-30 minutes earlier than appointment to allow [place] time  to  obtain all personal demographics,  insurance information and for you to complete office paperwork. - All established patients should arrive 10-15 minutes earlier than appointment time to update all information and be checked in .  - In our best efforts to run on time, if you are late for your appointment you will be asked to either reschedule or if able, we will work you back into the schedule. There will be a wait time to work you back in the schedule,  depending on availability.  - If you are unable to make it to your appointment as scheduled, please call 24 hours ahead of time to allow [place] to fill the time slot with someone else who needs to be seen. If you do not cancel your appointment ahead of time, you may be charged a no show fee.

## 2017-02-02 NOTE — Progress Notes (Signed)
Patient ID: Rachel Conner, female  DOB: December 27, 1935, 81 y.o.   MRN: [id number] Patient Care Team    Relationship Specialty Notifications Start End  Ma Hillock, DO PCP - General Family Medicine  10/13/16   Coralie Keens, MD Consulting Physician General Surgery  05/29/15   Gatha Mayer, MD Consulting Physician Gastroenterology  05/29/15   Josue Hector, MD Consulting Physician Cardiology  05/29/15   Tania Ade, RN Registered Nurse  Admissions 05/29/15    Comment: GI Oncology Nurse Navigator  Truitt Merle, MD Consulting Physician Hematology  10/13/16    Comment: colon cancer   Chief Complaint  Patient presents with  . Depression  . Anxiety    Subjective:  [name] is a 81 y.o.  female present for follow up on depression Depression with anxiety/chronic benzodiazepine use: Patient has been tried on Lexapro 5 mg, taper to 10 mg and she felt like she still did not get great benefit. She wanted to start low on the dosage. She continues to use Xanax 0.25 mg twice a day. She understands this medication is a controlled substance. She understands this medication has addictive properties. She has spoken with the pastor at that location once about her issues.   Prior note: Patient currently on 0.25 mg twice a day of Xanax only. She states that she has been getting the Xanax per her cardiologist. She reports at one time she was on Effexor, Cymbalta and trazodone. She felt the Effexor and Cymbalta causes dizziness. She doesn't recall why she stopped the trazodone but thinks she had side effect. She reports her last Xanax use was approximately 1 week ago because she is running out and she only has one left. She reports increased depression and anxiety since the loss of her husband suddenly in 2015. Patient doesn't bulge information, however EMR review suggests patient was admitted to behavioral health on February 2017 for major depression. 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 Note states that she was abusing benzodiazepines at that time, taking up to 4 mg of Xanax a day. At that time patient reported taking about he herself. She denies any current SI or HI.  Hypothyroidism:   Depression screen [facility] 2/9 02/02/2017 11/03/2016 10/13/2016 10/13/2016  Decreased Interest 3 3 3  0  Down, Depressed, Hopeless 3 3 3 2   PHQ - 2 Score 6 6 6 2   Altered sleeping 3 3 3 1   Tired, decreased energy 3 3 3 3   Change in appetite 3 3 3 2   Feeling bad or failure about yourself  2 3 3  0  Trouble concentrating 0 3 3 0  Moving slowly or fidgety/restless 0 3 3 0  Suicidal thoughts 0 3 3 0  PHQ-9 Score 17 27 27 8   Difficult doing work/chores - - Very difficult Somewhat difficult      GAD 7 : Generalized Anxiety Score 02/02/2017 10/13/2016  Nervous, Anxious, on Edge 1 3  Control/stop worrying 2 3  Worry too much - different things 1 2  Trouble relaxing 0 3  Restless 0 3  Easily annoyed or irritable 0 3  Afraid - awful might happen 0 2  Total GAD 7 Score 4 19  Anxiety Difficulty Somewhat difficult Very difficult    There is no immunization history for the selected administration types on file for this patient.   Past Medical History:  Diagnosis Date  . Anemia   .  Anxiety   . Arthritis    Osteoarthrosis  . CAD (coronary artery disease) 1999   CABG  . Cancer [facility])    cancer of the appendix   . Cataract    rt eye per pt  . CHF (congestive heart failure) ([place])   . Colon cancer ([place]) 2017  . Colon polyps   . Confusion, postoperative   . DVT (deep venous thrombosis) ([place])   . GERD (gastroesophageal reflux disease)   . Hemorrhoids   . Hiatal hernia   . HLD (hyperlipidemia)   . HTN (hypertension)   . Hypothyroidism   . Major depressive disorder   . MI (myocardial infarction) ([place])   . Peripheral neuropathy   . Urinary incontinence    Allergies  Allergen Reactions  . Niacin     Severe  flushing  . Nitrogen     Hypotension  . Nitroglycerin     Hypotension  . Myrbetriq [Mirabegron]     Gross hematuria  . Niaspan [Niacin Er]     Severe flushing   Past Surgical History:  Procedure Laterality Date  . CAROTID STENT  07/1998   stent RCA   . CHOLECYSTECTOMY  2011  . COLON SURGERY  03/2015   partial colon to remove cancer in appendix  . COLONOSCOPY W/ BIOPSIES    . CORONARY ARTERY BYPASS GRAFT  1999   6 vessel per pt.  . CORONARY STENT PLACEMENT  10/2001   circumflex stent  . ESOPHAGOGASTRODUODENOSCOPY    . LAPAROSCOPIC APPENDECTOMY N/A 04/11/2015   Procedure: LAPAROSCOPIC APPENDECTOMY;  Surgeon: Coralie Keens, MD;  Location: [facility];  Service: General;  Laterality: N/A;  Laparoscopic converted to open appendectomy   . LUMBAR LAMINECTOMY     fusion 1971  . PARTIAL COLECTOMY N/A 04/28/2015   Procedure: Ileocecectomy;  Surgeon: Coralie Keens, MD;  Location: [facility];  Service: General;  Laterality: N/A;  . Right Thumb Right    Thumb Finger Release  . THYROID SURGERY     Family History  Problem Relation Age of Onset  . Heart disease Father   . Heart disease Mother   . Heart disease Brother   . Cancer Sister        non hodgkins lymphoma  . Heart disease Other        grandfather (side unknown)  . Colon cancer Neg Hx    Social History   Social History  . Marital status: Widowed    Spouse name: N/A  . Number of children: 3  . Years of education: 72   Occupational History  . retired EMT    Social History Main Topics  . Smoking status: Former Smoker    Packs/day: 1.00    Years: 45.00    Quit date: 07/12/1997  . Smokeless tobacco: Never Used     Comment: quit smoking age 54  . Alcohol use No  . Drug use: No  . Sexual activity: No   Other Topics Concern  . Not on file   Social History Narrative   Widowed, lives alone, son and DIL live close.    Retired from being EMT/med tech   Originally from [place]   Able to perform ADL's independently   [place] a  daily vitamin   Wears her seatbelt   Wears dentures   Smoke detector in the home.   Feels safe in her relationships   Allergies as of 02/02/2017      Reactions   Niacin    Severe flushing  Nitrogen    Hypotension   Nitroglycerin    Hypotension   Myrbetriq [mirabegron]    Gross hematuria   Niaspan [niacin Er]    Severe flushing      Medication List       Accurate as of 02/02/17 11:59 PM. Always use your most recent med list.          amLODipine 10 MG tablet Commonly known as:  NORVASC TAKE 1 TABLET DAILY   aspirin EC 81 MG tablet Take 81 mg by mouth 3 (three) times a week.   atorvastatin 80 MG tablet Commonly known as:  LIPITOR TAKE 1 TABLET DAILY   escitalopram 20 MG tablet Commonly known as:  LEXAPRO Take 1 tablet (20 mg total) by mouth daily.   esomeprazole 40 MG capsule Commonly known as:  NEXIUM Take 1 capsule (40 mg total) by mouth daily.   levothyroxine 100 MCG tablet Commonly known as:  SYNTHROID, LEVOTHROID Take 1 tablet (100 mcg total) by mouth daily.   Loratadine 10 MG Caps Take 1 capsule (10 mg total) by mouth daily.   LORazepam 0.5 MG tablet Commonly known as:  ATIVAN Take 1 tablet (0.5 mg total) by mouth 2 (two) times daily as needed for anxiety.   Melatonin 5 MG Tabs Take 10 mg by mouth at bedtime.   prochlorperazine 10 MG tablet Commonly known as:  COMPAZINE Take 1 tablet (10 mg total) by mouth every 8 (eight) hours as needed for nausea or vomiting.   TENSION HEADACHE PO Take 1-2 tablets by mouth daily as needed (headache).   XARELTO 15 MG Tabs tablet Generic drug:  Rivaroxaban TAKE 1 TABLET DAILY WITH SUPPER        Recent Results (from the past 2160 hour(s))  Urine Culture     Status: None   Collection Time: 11/11/16  1:54 PM  Result Value Ref Range   Urine Culture, Routine Final report    Organism ID, Bacteria Comment     Comment: Mixed urogenital flora 10,000-25,000 colony forming units per mL   Urinalysis with  microscopic     Status: None   Collection Time: 11/11/16  2:04 PM  Result Value Ref Range   Glucose Negative Negative mg/dL   Bilirubin (Urine) Color Interference Negative   Ketones Negative Negative mg/dL   Specific Gravity, Urine 1.020 1.003 - 1.035   Blood Negative Negative   pH 6.0 4.6 - 8.0   Protein < 30 Negative- <30 mg/dL   Urobilinogen, UR 0.2 0.2 - 1 mg/dL   Nitrite Negative Negative   Leukocyte Esterase Trace Negative   RBC / HPF 0-2 0 - 2   WBC, UA 7-10 0-2;Negative   Bacteria, UA Moderate Negative- Trace   Epithelial Cells Few Negative- Few   Mucus, UA Moderate Negative- Small  TSH     Status: None   Collection Time: 02/02/17 11:58 AM  Result Value Ref Range   TSH 2.30 0.35 - 4.50 uIU/mL    Ct Head Wo Contrast  Result Date: 07/22/2016 CLINICAL DATA:  Trip and fall with head injury, initial encounter EXAM: CT HEAD WITHOUT CONTRAST TECHNIQUE: Contiguous axial images were obtained from the base of the skull through the vertex without intravenous contrast. COMPARISON:  05/09/2015 FINDINGS: Brain: Mild atrophic changes are seen. Chronic white matter ischemic changes noted as well. No findings to suggest acute hemorrhage, acute infarction or space-occupying mass lesion are noted. Vascular: No hyperdense vessel or unexpected calcification. Skull: Normal. Negative for fracture or focal lesion.  Sinuses/Orbits: No acute finding. Other: None. IMPRESSION: Chronic atrophic and ischemic changes without acute abnormality. Electronically Signed   By: Inez Catalina M.D.   On: 07/22/2016 13:45   ROS: 14 pt review of systems performed and negative (unless mentioned in an HPI)  Objective: BP 101/69 (BP Location: Left Arm, Patient Position: Sitting, Cuff Size: Normal)   Pulse 78   Temp 97.6 F (36.4 C)   Resp 20   Ht 5\' 4"  (1.626 m)   Wt 181 lb 8 oz (82.3 kg)   SpO2 96%   BMI 31.15 kg/m   Gen: Afebrile. No acute distress.  HENT: AT. [place]. MMM.  Eyes:Pupils Equal Round Reactive to  light, Extraocular movements intact,  Conjunctiva without redness, discharge or icterus. Neck/lymp/endocrine: Supple, no lymphadenopathy, no thyromegaly CV: RRR no murmur, no edema, pulses equal bilateral lower extremity Chest: CTAB, no wheeze or crackles Neuro:  Normal gait. PERLA. EOMi. Alert. Oriented x3 Psych: Normal affect, dress and demeanor. Normal speech. Normal thought content and judgment. Appears happy and in a good mood today.    Assessment/plan: YANIQUE MULVIHILL is a 81 y.o. female present for depression and anxiety follow up:  Depression with anxiety/benzodiazepine use - Again discuss benzodiazepine use with patient today, it is a controlled substance, it does have addictive properties. Again educated patient on Xanax not being an ideal medicine in the elderly. Patient has reverted back to taking Xanax approximately twice a day. Discussed the benefits of the least trying to switch to Ativan twice a day when necessary only and increasing her Lexapro today to get her more benefit. She is agreeable to these changes. - She has great insight into the potential addictive properties, but does still continue to use the medication. - [place] controlled substance database reviewed today and was appropriate. It was printed him a part of her permanent chart. - DC Xanax, Ativan 0.5 mg twice a day when necessary. - Lexapro 20 mg daily. - Patient did sign a controlled substance contract today. She is aware she can only receive this medication from this provider, with routine in person follow-up every 6 months.  Hypothyroidism: - TSH level collected today. Patient doing well on medications. Call in a short-term prescription to a local pharmacy since she took her last pill today. After results from lab tests, will call in a one-[facility].  Return in about 6 months (around 08/05/2017) for depression/anxiety/HTN. Sooner if needed.   Electronically signed  by: Howard Pouch, DO [place]

## 2017-02-03 DIAGNOSIS — Z79899 Other long term (current) drug therapy: Secondary | ICD-10-CM | POA: Insufficient documentation

## 2017-02-03 NOTE — Telephone Encounter (Signed)
Left message with lab results and information on patient voice mail per [facility]

## 2017-02-04 ENCOUNTER — Other Ambulatory Visit: Payer: Self-pay | Admitting: *Deleted

## 2017-02-04 DIAGNOSIS — E038 Other specified hypothyroidism: Secondary | ICD-10-CM

## 2017-02-04 MED ORDER — LEVOTHYROXINE SODIUM 100 MCG PO TABS: 100.0000 ug | ORAL_TABLET | Freq: Every day | ORAL | 3 refills | Status: DC

## 2017-03-26 ENCOUNTER — Other Ambulatory Visit: Payer: Self-pay | Admitting: Cardiovascular Disease
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
## 2017-03-31 ENCOUNTER — Ambulatory Visit: Payer: Medicare Other | Admitting: Cardiovascular Disease

## 2017-04-18 ENCOUNTER — Emergency Department (HOSPITAL_COMMUNITY): Payer: Medicare Other

## 2017-04-18 ENCOUNTER — Encounter (HOSPITAL_COMMUNITY): Payer: Self-pay

## 2017-04-18 ENCOUNTER — Inpatient Hospital Stay (HOSPITAL_COMMUNITY)
Admission: EM | Admit: 2017-04-18 | Discharge: 2017-04-22 | DRG: 871 | Disposition: A | Discharge status: Skilled Nursing Facility | Payer: Medicare Other | Attending: Internal Medicine | Admitting: Internal Medicine

## 2017-04-18 DIAGNOSIS — F419 Anxiety disorder, unspecified: Secondary | ICD-10-CM | POA: Diagnosis present

## 2017-04-18 DIAGNOSIS — Z955 Presence of coronary angioplasty implant and graft: Secondary | ICD-10-CM | POA: Diagnosis not present

## 2017-04-18 DIAGNOSIS — Z79899 Other long term (current) drug therapy: Secondary | ICD-10-CM

## 2017-04-18 DIAGNOSIS — Z888 Allergy status to other drugs, medicaments and biological substances status: Secondary | ICD-10-CM | POA: Diagnosis not present

## 2017-04-18 DIAGNOSIS — R4182 Altered mental status, unspecified: Secondary | ICD-10-CM | POA: Diagnosis present

## 2017-04-18 DIAGNOSIS — G9341 Metabolic encephalopathy: Secondary | ICD-10-CM | POA: Diagnosis present

## 2017-04-18 DIAGNOSIS — Z981 Arthrodesis status: Secondary | ICD-10-CM | POA: Diagnosis not present

## 2017-04-18 DIAGNOSIS — D72829 Elevated white blood cell count, unspecified: Secondary | ICD-10-CM

## 2017-04-18 DIAGNOSIS — Z85030 Personal history of malignant carcinoid tumor of large intestine: Secondary | ICD-10-CM | POA: Diagnosis not present

## 2017-04-18 DIAGNOSIS — I110 Hypertensive heart disease with heart failure: Secondary | ICD-10-CM | POA: Diagnosis present

## 2017-04-18 DIAGNOSIS — Z951 Presence of aortocoronary bypass graft: Secondary | ICD-10-CM

## 2017-04-18 DIAGNOSIS — E785 Hyperlipidemia, unspecified: Secondary | ICD-10-CM | POA: Diagnosis present

## 2017-04-18 DIAGNOSIS — R410 Disorientation, unspecified: Secondary | ICD-10-CM

## 2017-04-18 DIAGNOSIS — F329 Major depressive disorder, single episode, unspecified: Secondary | ICD-10-CM | POA: Diagnosis present

## 2017-04-18 DIAGNOSIS — Z86718 Personal history of other venous thrombosis and embolism: Secondary | ICD-10-CM

## 2017-04-18 DIAGNOSIS — Z7901 Long term (current) use of anticoagulants: Secondary | ICD-10-CM | POA: Diagnosis not present

## 2017-04-18 DIAGNOSIS — Z7982 Long term (current) use of aspirin: Secondary | ICD-10-CM | POA: Diagnosis not present

## 2017-04-18 DIAGNOSIS — I509 Heart failure, unspecified: Secondary | ICD-10-CM | POA: Diagnosis present

## 2017-04-18 DIAGNOSIS — R9431 Abnormal electrocardiogram [ECG] [EKG]: Secondary | ICD-10-CM | POA: Diagnosis not present

## 2017-04-18 DIAGNOSIS — Z87891 Personal history of nicotine dependence: Secondary | ICD-10-CM | POA: Diagnosis not present

## 2017-04-18 DIAGNOSIS — C189 Malignant neoplasm of colon, unspecified: Secondary | ICD-10-CM | POA: Diagnosis not present

## 2017-04-18 DIAGNOSIS — E876 Hypokalemia: Secondary | ICD-10-CM | POA: Diagnosis present

## 2017-04-18 DIAGNOSIS — E038 Other specified hypothyroidism: Secondary | ICD-10-CM | POA: Diagnosis present

## 2017-04-18 DIAGNOSIS — I252 Old myocardial infarction: Secondary | ICD-10-CM | POA: Diagnosis not present

## 2017-04-18 DIAGNOSIS — G9340 Encephalopathy, unspecified: Secondary | ICD-10-CM | POA: Diagnosis present

## 2017-04-18 DIAGNOSIS — E039 Hypothyroidism, unspecified: Secondary | ICD-10-CM | POA: Diagnosis present

## 2017-04-18 DIAGNOSIS — A419 Sepsis, unspecified organism: Principal | ICD-10-CM | POA: Diagnosis present

## 2017-04-18 HISTORY — DX: Sepsis, unspecified organism: A41.9

## 2017-04-18 LAB — URINALYSIS, COMPLETE (UACMP) WITH MICROSCOPIC
Bacteria, UA: NONE SEEN
Bilirubin Urine: NEGATIVE
GLUCOSE, UA: NEGATIVE mg/dL
HGB URINE DIPSTICK: NEGATIVE
Ketones, ur: 20 mg/dL — AB
LEUKOCYTES UA: NEGATIVE
NITRITE: NEGATIVE
PH: 6 (ref 5.0–8.0)
Protein, ur: 100 mg/dL — AB
Specific Gravity, Urine: 1.019 (ref 1.005–1.030)

## 2017-04-18 LAB — COMPREHENSIVE METABOLIC PANEL
ALK PHOS: 34 U/L — AB (ref 38–126)
ALT: 14 U/L (ref 14–54)
ANION GAP: 12 (ref 5–15)
AST: 24 U/L (ref 15–41)
Albumin: 3.5 g/dL (ref 3.5–5.0)
BILIRUBIN TOTAL: 1.7 mg/dL — AB (ref 0.3–1.2)
BUN: 20 mg/dL (ref 6–20)
CALCIUM: 8.6 mg/dL — AB (ref 8.9–10.3)
CO2: 23 mmol/L (ref 22–32)
CREATININE: 1.04 mg/dL — AB (ref 0.44–1.00)
Chloride: 107 mmol/L (ref 101–111)
GFR, EST AFRICAN AMERICAN: 58 mL/min — AB (ref >60)
GFR, EST NON AFRICAN AMERICAN: 50 mL/min — AB (ref >60)
Glucose, Bld: 119 mg/dL — ABNORMAL HIGH (ref 65–99)
Potassium: 2.9 mmol/L — ABNORMAL LOW (ref 3.5–5.1)
Sodium: 142 mmol/L (ref 135–145)
TOTAL PROTEIN: 5.9 g/dL — AB (ref 6.5–8.1)

## 2017-04-18 LAB — RAPID URINE DRUG SCREEN, HOSP PERFORMED
AMPHETAMINES: NOT DETECTED
BARBITURATES: NOT DETECTED
BENZODIAZEPINES: NOT DETECTED
COCAINE: NOT DETECTED
Opiates: NOT DETECTED
Tetrahydrocannabinol: NOT DETECTED

## 2017-04-18 LAB — CBC WITH DIFFERENTIAL/PLATELET
BASOS ABS: 0 10*3/uL (ref 0.0–0.1)
BASOS PCT: 0 %
EOS ABS: 0 10*3/uL (ref 0.0–0.7)
Eosinophils Relative: 0 %
HEMATOCRIT: 38.8 % (ref 36.0–46.0)
HEMOGLOBIN: 12.5 g/dL (ref 12.0–15.0)
Lymphocytes Relative: 9 %
Lymphs Abs: 1.2 10*3/uL (ref 0.7–4.0)
MCH: 28.7 pg (ref 26.0–34.0)
MCHC: 32.2 g/dL (ref 30.0–36.0)
MCV: 89.2 fL (ref 78.0–100.0)
Monocytes Absolute: 1 10*3/uL (ref 0.1–1.0)
Monocytes Relative: 7 %
NEUTROS ABS: 11.8 10*3/uL — AB (ref 1.7–7.7)
NEUTROS PCT: 84 %
Platelets: 292 10*3/uL (ref 150–400)
RBC: 4.35 MIL/uL (ref 3.87–5.11)
RDW: 14.6 % (ref 11.5–15.5)
WBC: 14 10*3/uL — ABNORMAL HIGH (ref 4.0–10.5)

## 2017-04-18 LAB — I-STAT CG4 LACTIC ACID, ED: Lactic Acid, Venous: 2.35 mmol/L (ref 0.5–1.9)

## 2017-04-18 LAB — ETHANOL

## 2017-04-18 LAB — AMMONIA: Ammonia: 14 umol/L (ref 9–35)

## 2017-04-18 MED ORDER — MELATONIN 3 MG PO TABS
9.0000 mg | ORAL_TABLET | Freq: Every day | ORAL | Status: DC
  Administered 2017-04-19: 9 mg via ORAL
  Filled 2017-04-18: qty 3

## 2017-04-18 MED ORDER — DEXTROSE 5 % IV SOLN
1.0000 g | Freq: Once | INTRAVENOUS | Status: AC
  Administered 2017-04-19: 1 g via INTRAVENOUS
  Filled 2017-04-18: qty 10

## 2017-04-18 MED ORDER — SODIUM CHLORIDE 0.9 % IV BOLUS (SEPSIS)
1000.0000 mL | Freq: Once | INTRAVENOUS | Status: AC
  Administered 2017-04-18: 1000 mL via INTRAVENOUS

## 2017-04-18 MED ORDER — VANCOMYCIN HCL 10 G IV SOLR
1500.0000 mg | Freq: Once | INTRAVENOUS | Status: AC
  Administered 2017-04-19: 1500 mg via INTRAVENOUS
  Filled 2017-04-18: qty 1500

## 2017-04-18 MED ORDER — VANCOMYCIN HCL IN DEXTROSE 750-5 MG/150ML-% IV SOLN
750.0000 mg | Freq: Two times a day (BID) | INTRAVENOUS | Status: DC
  Administered 2017-04-19: 750 mg via INTRAVENOUS
  Filled 2017-04-18 (×2): qty 150

## 2017-04-18 MED ORDER — LORAZEPAM 0.5 MG PO TABS
0.5000 mg | ORAL_TABLET | Freq: Two times a day (BID) | ORAL | Status: DC | PRN
  Administered 2017-04-19 – 2017-04-20 (×3): 0.5 mg via ORAL
  Filled 2017-04-18 (×3): qty 1

## 2017-04-18 MED ORDER — RIVAROXABAN 15 MG PO TABS
15.0000 mg | ORAL_TABLET | Freq: Every day | ORAL | Status: DC
  Administered 2017-04-19 – 2017-04-22 (×4): 15 mg via ORAL
  Filled 2017-04-18 (×4): qty 1

## 2017-04-18 MED ORDER — SODIUM CHLORIDE 0.9 % IV SOLN
2.0000 g | Freq: Four times a day (QID) | INTRAVENOUS | Status: DC
  Administered 2017-04-19 (×3): 2 g via INTRAVENOUS
  Filled 2017-04-18 (×5): qty 2000

## 2017-04-18 MED ORDER — ACETAMINOPHEN 325 MG PO TABS
650.0000 mg | ORAL_TABLET | ORAL | Status: DC | PRN
  Administered 2017-04-22: 650 mg via ORAL
  Filled 2017-04-18: qty 2

## 2017-04-18 MED ORDER — STROKE: EARLY STAGES OF RECOVERY BOOK
Freq: Once | Status: AC
  Administered 2017-04-19: 05:00:00
  Filled 2017-04-18: qty 1

## 2017-04-18 MED ORDER — LEVOTHYROXINE SODIUM 100 MCG PO TABS
100.0000 ug | ORAL_TABLET | Freq: Every day | ORAL | Status: DC
  Administered 2017-04-19 – 2017-04-22 (×3): 100 ug via ORAL
  Filled 2017-04-18 (×5): qty 1

## 2017-04-18 MED ORDER — ESCITALOPRAM OXALATE 20 MG PO TABS
20.0000 mg | ORAL_TABLET | Freq: Every evening | ORAL | Status: DC
  Administered 2017-04-19: 20 mg via ORAL
  Filled 2017-04-18: qty 1

## 2017-04-18 MED ORDER — DEXTROSE 5 % IV SOLN: 2.0000 g | Freq: Once | INTRAVENOUS | Status: DC

## 2017-04-18 MED ORDER — POTASSIUM CHLORIDE CRYS ER 20 MEQ PO TBCR
40.0000 meq | EXTENDED_RELEASE_TABLET | ORAL | Status: AC
  Administered 2017-04-19: 40 meq via ORAL
  Filled 2017-04-18: qty 2

## 2017-04-18 MED ORDER — SENNOSIDES-DOCUSATE SODIUM 8.6-50 MG PO TABS: 1.0000 | ORAL_TABLET | Freq: Every evening | ORAL | Status: DC | PRN

## 2017-04-18 MED ORDER — PANTOPRAZOLE SODIUM 40 MG PO TBEC
40.0000 mg | DELAYED_RELEASE_TABLET | Freq: Every day | ORAL | Status: DC
  Administered 2017-04-19 – 2017-04-20 (×2): 40 mg via ORAL
  Filled 2017-04-18 (×2): qty 1

## 2017-04-18 MED ORDER — DEXTROSE 5 % IV SOLN
1.0000 g | Freq: Once | INTRAVENOUS | Status: AC
  Administered 2017-04-18: 1 g via INTRAVENOUS
  Filled 2017-04-18: qty 10

## 2017-04-18 MED ORDER — ACETAMINOPHEN 160 MG/5ML PO SOLN: 650.0000 mg | ORAL | Status: DC | PRN

## 2017-04-18 MED ORDER — DEXAMETHASONE SODIUM PHOSPHATE 10 MG/ML IJ SOLN
10.0000 mg | Freq: Once | INTRAMUSCULAR | Status: AC
  Administered 2017-04-19: 10 mg via INTRAVENOUS
  Filled 2017-04-18: qty 1

## 2017-04-18 MED ORDER — AMLODIPINE BESYLATE 10 MG PO TABS
10.0000 mg | ORAL_TABLET | Freq: Every day | ORAL | Status: DC
Start: 2017-04-19 — End: 2017-04-22
  Administered 2017-04-19 – 2017-04-22 (×3): 10 mg via ORAL
  Filled 2017-04-18 (×4): qty 1

## 2017-04-18 MED ORDER — SODIUM CHLORIDE 0.9 % IV SOLN
2.0000 g | Freq: Once | INTRAVENOUS | Status: AC
  Administered 2017-04-19: 2 g via INTRAVENOUS
  Filled 2017-04-18: qty 2000

## 2017-04-18 MED ORDER — VANCOMYCIN HCL IN DEXTROSE 1-5 GM/200ML-% IV SOLN: 1000.0000 mg | Freq: Once | INTRAVENOUS | Status: DC

## 2017-04-18 MED ORDER — ATORVASTATIN CALCIUM 80 MG PO TABS
80.0000 mg | ORAL_TABLET | Freq: Every day | ORAL | Status: DC
  Administered 2017-04-19: 80 mg via ORAL
  Filled 2017-04-18: qty 1

## 2017-04-18 MED ORDER — POTASSIUM CHLORIDE 10 MEQ/100ML IV SOLN
10.0000 meq | Freq: Once | INTRAVENOUS | Status: AC
  Administered 2017-04-18: 10 meq via INTRAVENOUS
  Filled 2017-04-18: qty 100

## 2017-04-18 MED ORDER — SODIUM CHLORIDE 0.9 % IV SOLN
INTRAVENOUS | Status: DC
  Administered 2017-04-19 – 2017-04-20 (×2): via INTRAVENOUS

## 2017-04-18 MED ORDER — ACETAMINOPHEN 650 MG RE SUPP: 650.0000 mg | RECTAL | Status: DC | PRN

## 2017-04-18 MED ORDER — DEXTROSE 5 % IV SOLN
2.0000 g | Freq: Two times a day (BID) | INTRAVENOUS | Status: DC
  Administered 2017-04-19: 2 g via INTRAVENOUS
  Filled 2017-04-18 (×2): qty 2

## 2017-04-18 NOTE — ED Notes (Signed)
Pt taken to CT.

## 2017-04-18 NOTE — Progress Notes (Signed)
Pharmacy Antibiotic Note  Rachel Conner is a 81 y.o. female admitted on 04/18/2017 with suspected meningitis.  Pharmacy has been consulted for vancomycin, ceftriaxone, and ampicillin dosing.  Plan: Vancomycin 1500mg  x1 then 750mg  IV every 12 hours.  Goal trough 15-20 mcg/mL.  Ceftriaxone 2g IV every 12 hours. Ampicillin 2g IV every 6 hours.  Height: 5\' 5"  (165.1 cm) Weight: 180 lb (81.6 kg) IBW/kg (Calculated) : 57  Temp (24hrs), Avg:98.4 F (36.9 C), Min:98.3 F (36.8 C), Max:98.4 F (36.9 C)   Recent Labs Lab 04/18/17 1915 04/18/17 1940  WBC 14.0*  --   CREATININE 1.04*  --   LATICACIDVEN  --  2.35*    Estimated Creatinine Clearance: 46.3 mL/min (A) (by C-G formula based on SCr of 1.04 mg/dL (H)).    Allergies  Allergen Reactions  . Niacin     Severe flushing  . Nitrogen     Hypotension  . Nitroglycerin     Hypotension  . Myrbetriq [Mirabegron]     Gross hematuria  . Niaspan [Niacin Er]     Severe flushing     Thank you for allowing pharmacy to be a part of this patient's care.  Wynona Neat, PharmD, BCPS  04/18/2017 11:51 PM

## 2017-04-18 NOTE — ED Triage Notes (Signed)
Per [employer], pt was found at home sitting in her yard picking grass and talking out of her head. Pt's daughter was contacted and stated that she spoke with the pt at 1113 this morning and the pt was speaking normal. Pt disoriented x 4, following all commands, no neuro deficits. VSS.

## 2017-04-18 NOTE — H&P (Addendum)
History and Physical    Rachel Conner HCW:[id number] DOB: [birth year] DOA: 04/18/2017  Referring MD/NP/PA: Dr. Addison Lank PCP: Ma Hillock, DO  Patient coming from: via EMS  Chief Complaint: Altered  HPI: Rachel Conner is a 81 y.o. 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 Burr Medico of oncology ; who presents after being found sitting in her yard picking grass altered. Patient was last noted to talk to her daughter about 2 days ago, and was noted to be fairly normal. In talks with the patient's daughter over the phone, she notes that the patient had left a message on her voicemail around 30 AM this morning for which the patient's speech was slurred speech and was hard to understand what she was talking about. The daughter lives out of town and states that she called her multiple times without an answer and therefore called the sheriff's department to do a wellness check. The patient's new neighbors had come home sometime around 5 PM and found the patient in the yard picking grass and appearing to be confused and therefore called EMS. Patient was reportedly found in her yard picking grass and talking out of her head. At baseline the patient lives alone and her daughter normally talks to her on a daily basis. Daughter notes that she may have had some slurred speech after 4 days prior that she recalls. Associated symptoms include recent complaints of generalized malaise, cough, intermittent diarrhea, nausea, and vomiting. Daughter makes note that patient has had some issues with diarrhea, nausea, vomiting since she was diagnosed with carcinoid adenocarcinoma, but is followed by Dr. Burr Medico and noted to have normal workup last checked in 11/2016.   ED Course: Upon admission into the emergency department patient was seen to be afebrile with pulse 79-89,  respirations 12-27, blood pressure 153/65 to 179/95, and O2 saturations maintained on room air. Labs revealed WBC 14, potassium 2.9, BUN 20, creatinine 1.04, total bilirubin 1.7, and lactic acid 2.35. UDS was negative. Chest x-ray and urinalysis showed no clear signs of infection. CT scan of the brain showed no acute abnormalities. Patient was empirically given antibiotics of Rocephin.   Review of Systems  Unable to perform ROS: Mental status change  Constitutional: Positive for malaise/fatigue.  Respiratory: Positive for cough.   Gastrointestinal: Positive for diarrhea, nausea and vomiting.  Musculoskeletal: Positive for joint pain.  Neurological:       Altered mental status    Past Medical History:  Diagnosis Date  . Anemia   . Anxiety   . Arthritis    Osteoarthrosis  . CAD (coronary artery disease) 1999   CABG  . Cancer [facility])    cancer of the appendix   . Cataract    rt eye per pt  . CHF (congestive heart failure) ([place])   . Colon cancer ([place]) 2017  . Colon polyps   . Confusion, postoperative   . DVT (deep venous thrombosis) ([place])   . GERD (gastroesophageal reflux disease)   . Hemorrhoids   . Hiatal hernia   . HLD (hyperlipidemia)   . HTN (hypertension)   . Hypothyroidism   . Major depressive disorder   . MI (myocardial infarction) ([place])   . Peripheral neuropathy   . Urinary incontinence     Past Surgical History:  Procedure Laterality Date  . CAROTID STENT  07/1998   stent RCA   .  CHOLECYSTECTOMY  2011  . COLON SURGERY  03/2015   partial colon to remove cancer in appendix  . COLONOSCOPY W/ BIOPSIES    . CORONARY ARTERY BYPASS GRAFT  1999   6 vessel per pt.  . CORONARY STENT PLACEMENT  10/2001   circumflex stent  . ESOPHAGOGASTRODUODENOSCOPY    . LAPAROSCOPIC APPENDECTOMY N/A 04/11/2015   Procedure: LAPAROSCOPIC APPENDECTOMY;  Surgeon: Coralie Keens, MD;  Location: [place];  Service: General;  Laterality: N/A;  Laparoscopic converted to open appendectomy   .  LUMBAR LAMINECTOMY     fusion 1971  . PARTIAL COLECTOMY N/A 04/28/2015   Procedure: Ileocecectomy;  Surgeon: Coralie Keens, MD;  Location: [place];  Service: General;  Laterality: N/A;  . Right Thumb Right    Thumb Finger Release  . THYROID SURGERY       reports that she quit smoking about 19 years ago. She has a 45.00 pack-year smoking history. She has never used smokeless tobacco. She reports that she does not drink alcohol or use drugs.  Allergies  Allergen Reactions  . Niacin     Severe flushing  . Nitrogen     Hypotension  . Nitroglycerin     Hypotension  . Myrbetriq [Mirabegron]     Gross hematuria  . Niaspan [Niacin Er]     Severe flushing    Family History  Problem Relation Age of Onset  . Heart disease Father   . Heart disease Mother   . Heart disease Brother   . Cancer Sister        non hodgkins lymphoma  . Heart disease Other        grandfather (side unknown)  . Colon cancer Neg Hx     Prior to Admission medications   Medication Sig Start Date End Date Taking? Authorizing Provider  Acetaminophen-Caffeine (TENSION HEADACHE PO) Take 1-2 tablets by mouth daily as needed (headache).    [provider]  amLODipine (NORVASC) 10 MG tablet TAKE 1 TABLET DAILY Patient taking differently: Take 10 mg by mouth once a day 11/23/16   Josue Hector, MD  aspirin EC 81 MG tablet Take 81 mg by mouth 3 (three) times a week.     [provider]  atorvastatin (LIPITOR) 80 MG tablet TAKE 1 TABLET DAILY Patient taking differently: Take 80 mg by mouth in the evening 11/23/16   Josue Hector, MD  escitalopram (LEXAPRO) 20 MG tablet Take 1 tablet (20 mg total) by mouth daily. Patient taking differently: Take 20 mg by mouth every evening.  02/02/17   Kuneff, Renee A, DO  esomeprazole (NEXIUM) 40 MG capsule Take 1 capsule (40 mg total) by mouth daily. 10/13/16   Kuneff, Renee A, DO  levothyroxine (SYNTHROID, LEVOTHROID) 100 MCG tablet Take 1 tablet (100 mcg total) by  mouth daily. 02/04/17   Kuneff, Renee A, DO  Loratadine 10 MG CAPS Take 1 capsule (10 mg total) by mouth daily. 10/13/16   Kuneff, Renee A, DO  LORazepam (ATIVAN) 0.5 MG tablet Take 1 tablet (0.5 mg total) by mouth 2 (two) times daily as needed for anxiety. 02/02/17   Kuneff, Renee A, DO  Melatonin 5 MG TABS Take 10 mg by mouth at bedtime.    [provider]  prochlorperazine (COMPAZINE) 10 MG tablet Take 1 tablet (10 mg total) by mouth every 8 (eight) hours as needed for nausea or vomiting. 11/11/16   Truitt Merle, MD  XARELTO 15 MG TABS tablet TAKE 1 TABLET DAILY WITH SUPPER  Patient taking differently: Take 15 mg by mouth daily with supper 03/28/17   Josue Hector, MD    Physical Exam:  Constitutional: Elderly female who appears to be altered and will follow commands with repeated request Vitals:   04/18/17 2200 04/18/17 2210 04/18/17 2215 04/18/17 2230  BP: (!) 179/95  (!) 161/80 (!) 166/90  Pulse: 89  79 84  Resp: (!) 27  (!) 21 17  Temp:  98.3 F (36.8 C)    TempSrc:      SpO2: 97%  98% 98%  Weight:      Height:       Eyes: PERRL, lids and conjunctivae normal ENMT: Mucous membranes are moist. Posterior pharynx clear of any exudate or lesions.   Neck: normal, supple, no masses, no thyromegaly Respiratory: clear to auscultation bilaterally, no wheezing, no crackles. Normal respiratory effort. No accessory muscle use.  Cardiovascular: Regular rate and rhythm, no murmurs / rubs / gallops. No extremity edema. 2+ pedal pulses. No carotid bruits.  Abdomen: no tenderness, no masses palpated. No hepatosplenomegaly. Bowel sounds positive.  Musculoskeletal: no clubbing / cyanosis. No joint deformity upper and lower extremities. Good ROM, no contractures. Normal muscle tone.  Skin: no rashes, lesions, ulcers. No induration Neurologic: CN 2-12 grossly intact. Sensation intact, DTR normal. Strength 5/5 in all 4. Speech is slurred intermittently. Psychiatric:  Alert and oriented x person.  Normal mood.     Labs on Admission: I have personally reviewed following labs and imaging studies  CBC:  Recent Labs Lab 04/18/17 1915  WBC 14.0*  NEUTROABS 11.8*  HGB 12.5  HCT 38.8  MCV 89.2  PLT 332   Basic Metabolic Panel:  Recent Labs Lab 04/18/17 1915  NA 142  K 2.9*  CL 107  CO2 23  GLUCOSE 119*  BUN 20  CREATININE 1.04*  CALCIUM 8.6*   GFR: Estimated Creatinine Clearance: 46.3 mL/min (A) (by C-G formula based on SCr of 1.04 mg/dL (H)). Liver Function Tests:  Recent Labs Lab 04/18/17 1915  AST 24  ALT 14  ALKPHOS 34*  BILITOT 1.7*  PROT 5.9*  ALBUMIN 3.5   No results for input(s): LIPASE, AMYLASE in the last 168 hours.  Recent Labs Lab 04/18/17 1915  AMMONIA 14   Coagulation Profile: No results for input(s): INR, PROTIME in the last 168 hours. Cardiac Enzymes: No results for input(s): CKTOTAL, CKMB, CKMBINDEX, TROPONINI in the last 168 hours. BNP (last 3 results) No results for input(s): PROBNP in the last 8760 hours. HbA1C: No results for input(s): HGBA1C in the last 72 hours. CBG: No results for input(s): GLUCAP in the last 168 hours. Lipid Profile: No results for input(s): CHOL, HDL, LDLCALC, TRIG, CHOLHDL, LDLDIRECT in the last 72 hours. Thyroid Function Tests: No results for input(s): TSH, T4TOTAL, FREET4, T3FREE, THYROIDAB in the last 72 hours. Anemia Panel: No results for input(s): VITAMINB12, FOLATE, FERRITIN, TIBC, IRON, RETICCTPCT in the last 72 hours. Urine analysis:    Component Value Date/Time   COLORURINE YELLOW 04/18/2017 1915   APPEARANCEUR HAZY (A) 04/18/2017 1915   LABSPEC 1.019 04/18/2017 1915   LABSPEC 1.020 11/11/2016 1404   PHURINE 6.0 04/18/2017 1915   GLUCOSEU NEGATIVE 04/18/2017 1915   GLUCOSEU Negative 11/11/2016 1404   HGBUR NEGATIVE 04/18/2017 1915   BILIRUBINUR NEGATIVE 04/18/2017 1915   BILIRUBINUR Color Interference 11/11/2016 1404   KETONESUR 20 (A) 04/18/2017 1915   PROTEINUR 100 (A) 04/18/2017  1915   UROBILINOGEN 0.2 11/11/2016 1404   NITRITE NEGATIVE 04/18/2017 1915  LEUKOCYTESUR NEGATIVE 04/18/2017 1915   LEUKOCYTESUR Trace 11/11/2016 1404   Sepsis Labs: No results found for this or any previous visit (from the past 240 hour(s)).   Radiological Exams on Admission: Dg Chest 2 View  Result Date: 04/18/2017 CLINICAL DATA:  Leukocytosis.  Congestion. EXAM: CHEST  2 VIEW COMPARISON:  12/11/2015 FINDINGS: The lungs are clear. The pulmonary vasculature is normal. Heart size is normal. Hilar and mediastinal contours are unremarkable. There is no pleural effusion. IMPRESSION: No active cardiopulmonary disease. Electronically Signed   By: Andreas Newport M.D.   On: 04/18/2017 21:26   Ct Head Wo Contrast  Result Date: 04/18/2017 CLINICAL DATA:  Confusion. EXAM: CT HEAD WITHOUT CONTRAST TECHNIQUE: Contiguous axial images were obtained from the base of the skull through the vertex without intravenous contrast. COMPARISON:  07/22/2016 FINDINGS: Brain: There is no evidence for acute hemorrhage, hydrocephalus, mass lesion, or abnormal extra-axial fluid collection. No definite CT evidence for acute infarction. Diffuse loss of parenchymal volume is consistent with atrophy. Patchy low attenuation in the deep hemispheric and periventricular white matter is nonspecific, but likely reflects chronic microvascular ischemic demyelination. Stable appearance lacunar infarcts in the bilateral splenic nuclei. Vascular: No hyperdense vessel or unexpected calcification. Skull: No evidence for fracture. No worrisome lytic or sclerotic lesion. Sinuses/Orbits: The visualized paranasal sinuses and mastoid air cells are clear. Visualized portions of the globes and intraorbital fat are unremarkable. Other: None. IMPRESSION: 1. Stable.  No acute intracranial abnormality. 2. Atrophy with chronic small vessel white matter ischemic disease. Electronically Signed   By: Misty Stanley M.D.   On: 04/18/2017 19:58    EKG:  Independently reviewed. Sinus rhythm was prolonged QTC of 511.  Assessment/Plan Acute encephalopathy:  Patient presents acutely altered with note of slurred speech. Discuss case with neurology, but recommended MRI and to consult thereafter if needed. - Admit to telemetry bed - neuro checks - Check MRI of the brain  Sepsis, of unknown origin: Patient presented with intermittent tachypnea, WBC 14, and lactic acid 2.35 on admission. Chest x-ray was otherwise noted to be clear hold with urinalysis. Given limited knowledge of events or known fever empirically treated for the possibility of encephalitis/meningitis vs.CVA  - Follow-up blood cultures - Broadened antibiotics Vancomycin, ampicillin, and Rocephin for the possibility of meningitis - Trend lactic acid level  Hypokalemia: Acute. Initial potassium 2.9 on admission. Patient was given 10 mEq of potassium chloride IV. - Check magnesium level - Give additional potassium chloride 40 mEq po - Continue to monitor and replace as needed  Prolonged QT interval: Acute. QTC noted to be 511 on admission, but patient with electrolyte abnormalities. - Correcting electrolyte abnormalities - Recheck EKG tomorrow a.m.  Essential hypertension - Continue amlodipine   Hypothyroidism - Checking TSH  - Continue levothyroxine   CAD s/p CABG and PCI to RCA/OM - Continue outpatient follow-up with Dr. Johnsie Cancel   History of DVT on chronic anticoagulation - Continue Xarelto   Depression/anxiety - Continue Lexapro and Ativan Prn Anxiety   Mixed goblet cell carcinoid adenocarcinoma: Patient status post resection with hemicolectomy in 04/2015 being followed by oncology. Evaluated last in 11/11/2016 by Dr. Burr Medico with lab work noted to be within normal limits. - Continue outpatient follow-up  DVT prophylaxis: Xarelto  Code Status:  Full Family Communication: Discussed plan of care with the patient's daughter over the phone Disposition Plan: TBD Consults  called: none  Admission status: inpatient  Norval Morton MD [facility] Pager [phone]   If 7PM-7AM, please contact  night-coverage [email or web address] Password [email or web address]  04/18/2017, 10:34 PM

## 2017-04-18 NOTE — ED Provider Notes (Signed)
[place] DEPT Provider Note   CSN: [id number] Arrival date & time: 04/18/17  1857     History   Chief Complaint Chief Complaint  Patient presents with  . Altered Mental Status    HPI Rachel Conner is a 81 y.o. female.  HPI 81 year old female with an extensive past medical history listed below who presents to the emergency department with altered mental status. Per EMS patient was found in the front yard by a neighbor altered. At baseline patient is oriented 4 reported by EMS as per the daughter. No known recent fevers or illnesses. No evidence of trauma on initial assessment. Patient has been alert and cooperative but disoriented. Patient is on a blood thinner.  Remainder of history, ROS, and physical exam limited due to patient's condition (AMS). Additional information was obtained from EMS.   Level V Caveat.   Past Medical History:  Diagnosis Date  . Anemia   . Anxiety   . Arthritis    Osteoarthrosis  . CAD (coronary artery disease) 1999   CABG  . Cancer [facility])    cancer of the appendix   . Cataract    rt eye per pt  . CHF (congestive heart failure) ([place])   . Colon cancer ([place]) 2017  . Colon polyps   . Confusion, postoperative   . DVT (deep venous thrombosis) ([place])   . GERD (gastroesophageal reflux disease)   . Hemorrhoids   . Hiatal hernia   . HLD (hyperlipidemia)   . HTN (hypertension)   . Hypothyroidism   . Major depressive disorder   . MI (myocardial infarction) ([place])   . Peripheral neuropathy   . Urinary incontinence     Patient Active Problem List   Diagnosis Date Noted  . Sepsis ([place]) 04/18/2017  . Chronic prescription benzodiazepine use 02/03/2017  . Essential hypertension 11/03/2016  . Chronic seasonal allergic rhinitis due to pollen 10/13/2016  . Gastroesophageal reflux disease without esophagitis 10/13/2016  . Depression with anxiety 10/13/2016  . Mixed carcinoid-adenocarcinoma of colon ([place]) 04/28/2015  . Vascular dementia  11/07/2013  . Long term current use of anticoagulant therapy 04/03/2013  . DVT (deep venous thrombosis) ([place]) 03/06/2013  . Hypothyroidism 05/20/2008  . HYPERCHOLESTEROLEMIA, MIXED 05/20/2008  . HYPERTENSIVE CARDIOVASCULAR DISEASE, BENIGN 05/20/2008  . CORONARY ATHEROSCLEROSIS, NATIVE VESSEL 05/20/2008  . ANEMIA 01/26/2008    Past Surgical History:  Procedure Laterality Date  . CAROTID STENT  07/1998   stent RCA   . CHOLECYSTECTOMY  2011  . COLON SURGERY  03/2015   partial colon to remove cancer in appendix  . COLONOSCOPY W/ BIOPSIES    . CORONARY ARTERY BYPASS GRAFT  1999   6 vessel per pt.  . CORONARY STENT PLACEMENT  10/2001   circumflex stent  . ESOPHAGOGASTRODUODENOSCOPY    . LAPAROSCOPIC APPENDECTOMY N/A 04/11/2015   Procedure: LAPAROSCOPIC APPENDECTOMY;  Surgeon: Coralie Keens, MD;  Location: [place];  Service: General;  Laterality: N/A;  Laparoscopic converted to open appendectomy   . LUMBAR LAMINECTOMY     fusion 1971  . PARTIAL COLECTOMY N/A 04/28/2015   Procedure: Ileocecectomy;  Surgeon: Coralie Keens, MD;  Location: [place];  Service: General;  Laterality: N/A;  . Right Thumb Right    Thumb Finger Release  . THYROID SURGERY      OB History    Gravida Para Term Preterm AB Living   3 3       3    SAB TAB Ectopic Multiple Live Births  Home Medications    Prior to Admission medications   Medication Sig Start Date End Date Taking? Authorizing Provider  Acetaminophen-Caffeine (TENSION HEADACHE PO) Take 1-2 tablets by mouth daily as needed (headache).    [provider]  amLODipine (NORVASC) 10 MG tablet TAKE 1 TABLET DAILY Patient taking differently: Take 10 mg by mouth once a day 11/23/16   Josue Hector, MD  aspirin EC 81 MG tablet Take 81 mg by mouth 3 (three) times a week.     [provider]  atorvastatin (LIPITOR) 80 MG tablet TAKE 1 TABLET DAILY Patient taking differently: Take 80 mg by mouth in the evening 11/23/16    Josue Hector, MD  escitalopram (LEXAPRO) 20 MG tablet Take 1 tablet (20 mg total) by mouth daily. Patient taking differently: Take 20 mg by mouth every evening.  02/02/17   Kuneff, Renee A, DO  esomeprazole (NEXIUM) 40 MG capsule Take 1 capsule (40 mg total) by mouth daily. 10/13/16   Kuneff, Renee A, DO  levothyroxine (SYNTHROID, LEVOTHROID) 100 MCG tablet Take 1 tablet (100 mcg total) by mouth daily. 02/04/17   Kuneff, Renee A, DO  Loratadine 10 MG CAPS Take 1 capsule (10 mg total) by mouth daily. 10/13/16   Kuneff, Renee A, DO  LORazepam (ATIVAN) 0.5 MG tablet Take 1 tablet (0.5 mg total) by mouth 2 (two) times daily as needed for anxiety. 02/02/17   Kuneff, Renee A, DO  Melatonin 5 MG TABS Take 10 mg by mouth at bedtime.    [provider]  prochlorperazine (COMPAZINE) 10 MG tablet Take 1 tablet (10 mg total) by mouth every 8 (eight) hours as needed for nausea or vomiting. 11/11/16   Truitt Merle, MD  XARELTO 15 MG TABS tablet TAKE 1 TABLET DAILY WITH SUPPER Patient taking differently: Take 15 mg by mouth daily with supper 03/28/17   Josue Hector, MD    Family History Family History  Problem Relation Age of Onset  . Heart disease Father   . Heart disease Mother   . Heart disease Brother   . Cancer Sister        non hodgkins lymphoma  . Heart disease Other        grandfather (side unknown)  . Colon cancer Neg Hx     Social History Social History  Substance Use Topics  . Smoking status: Former Smoker    Packs/day: 1.00    Years: 45.00    Quit date: 07/12/1997  . Smokeless tobacco: Never Used     Comment: quit smoking age 75  . Alcohol use No     Allergies   Niacin; Nitrogen; Nitroglycerin; Myrbetriq [mirabegron]; and Niaspan [niacin er]   Review of Systems Review of Systems  Unable to perform ROS: Mental status change     Physical Exam Updated Vital Signs BP (!) 158/83   Pulse 83   Temp 98.3 F (36.8 C)   Resp 13   Ht 5\' 5"  (1.651 m)   Wt 81.6 kg (180 lb)    SpO2 98%   BMI 29.95 kg/m   Physical Exam  Constitutional: She appears well-developed and well-nourished. No distress.  HENT:  Head: Normocephalic and atraumatic.  Nose: Nose normal.  Eyes: Pupils are equal, round, and reactive to light. Conjunctivae and EOM are normal. Right eye exhibits no discharge. Left eye exhibits no discharge. No scleral icterus.  Neck: Normal range of motion. Neck supple.  Cardiovascular: Normal rate and regular rhythm.  Exam reveals no  gallop and no friction rub.   No murmur heard. Pulmonary/Chest: Effort normal and breath sounds normal. No stridor. No respiratory distress. She has no rales.  Abdominal: Soft. She exhibits no distension. There is no tenderness.  Musculoskeletal: She exhibits no edema or tenderness.  Neurological: She is alert. She is disoriented (Oriented to person only).  Skin: Skin is warm and dry. No rash noted. She is not diaphoretic. No erythema.  Psychiatric: She has a normal mood and affect.  Vitals reviewed.    ED Treatments / Results  Labs (all labs ordered are listed, but only abnormal results are displayed) Labs Reviewed  COMPREHENSIVE METABOLIC PANEL - Abnormal; Notable for the following:       Result Value   Potassium 2.9 (*)    Glucose, Bld 119 (*)    Creatinine, Ser 1.04 (*)    Calcium 8.6 (*)    Total Protein 5.9 (*)    Alkaline Phosphatase 34 (*)    Total Bilirubin 1.7 (*)    GFR calc non Af Amer 50 (*)    GFR calc Af Amer 58 (*)    All other components within normal limits  CBC WITH DIFFERENTIAL/PLATELET - Abnormal; Notable for the following:    WBC 14.0 (*)    Neutro Abs 11.8 (*)    All other components within normal limits  URINALYSIS, COMPLETE (UACMP) WITH MICROSCOPIC - Abnormal; Notable for the following:    APPearance HAZY (*)    Ketones, ur 20 (*)    Protein, ur 100 (*)    Squamous Epithelial / LPF 0-5 (*)    All other components within normal limits  I-STAT CG4 LACTIC ACID, ED - Abnormal; Notable for  the following:    Lactic Acid, Venous 2.35 (*)    All other components within normal limits  CULTURE, BLOOD (ROUTINE X 2)  CULTURE, BLOOD (ROUTINE X 2)  AMMONIA  ETHANOL  RAPID URINE DRUG SCREEN, HOSP PERFORMED  MAGNESIUM  TSH  LACTIC ACID, PLASMA  LACTIC ACID, PLASMA  PROCALCITONIN  CBG MONITORING, ED  I-STAT VENOUS BLOOD GAS, ED    EKG  EKG Interpretation  Date/Time:  Monday April 18 2017 18:57:55 EDT Ventricular Rate:  79 PR Interval:    QRS Duration: 98 QT Interval:  445 QTC Calculation: 511 R Axis:   47 Text Interpretation:  Sinus rhythm Prolonged PR interval Nonspecific T abnormalities, inferior leads Prolonged QT interval No significant change since last tracing Confirmed by Addison Lank [phone]) on 04/18/2017 8:17:20 PM       Radiology Dg Chest 2 View  Result Date: 04/18/2017 CLINICAL DATA:  Leukocytosis.  Congestion. EXAM: CHEST  2 VIEW COMPARISON:  12/11/2015 FINDINGS: The lungs are clear. The pulmonary vasculature is normal. Heart size is normal. Hilar and mediastinal contours are unremarkable. There is no pleural effusion. IMPRESSION: No active cardiopulmonary disease. Electronically Signed   By: Andreas Newport M.D.   On: 04/18/2017 21:26   Ct Head Wo Contrast  Result Date: 04/18/2017 CLINICAL DATA:  Confusion. EXAM: CT HEAD WITHOUT CONTRAST TECHNIQUE: Contiguous axial images were obtained from the base of the skull through the vertex without intravenous contrast. COMPARISON:  07/22/2016 FINDINGS: Brain: There is no evidence for acute hemorrhage, hydrocephalus, mass lesion, or abnormal extra-axial fluid collection. No definite CT evidence for acute infarction. Diffuse loss of parenchymal volume is consistent with atrophy. Patchy low attenuation in the deep hemispheric and periventricular white matter is nonspecific, but likely reflects chronic microvascular ischemic demyelination. Stable appearance lacunar infarcts in  the bilateral splenic nuclei. Vascular: No  hyperdense vessel or unexpected calcification. Skull: No evidence for fracture. No worrisome lytic or sclerotic lesion. Sinuses/Orbits: The visualized paranasal sinuses and mastoid air cells are clear. Visualized portions of the globes and intraorbital fat are unremarkable. Other: None. IMPRESSION: 1. Stable.  No acute intracranial abnormality. 2. Atrophy with chronic small vessel white matter ischemic disease. Electronically Signed   By: Misty Stanley M.D.   On: 04/18/2017 19:58    Procedures Procedures (including critical care time)  Medications Ordered in ED Medications  amLODipine (NORVASC) tablet 10 mg (not administered)  escitalopram (LEXAPRO) tablet 20 mg (not administered)  LORazepam (ATIVAN) tablet 0.5 mg (0.5 mg Oral Given 04/19/17 0024)  atorvastatin (LIPITOR) tablet 80 mg (not administered)  pantoprazole (PROTONIX) EC tablet 40 mg (not administered)  Melatonin TABS 10 mg (not administered)  levothyroxine (SYNTHROID, LEVOTHROID) tablet 100 mcg (not administered)  Rivaroxaban (XARELTO) tablet 15 mg (not administered)   stroke: mapping our early stages of recovery book (not administered)  0.9 %  sodium chloride infusion ( Intravenous New Bag/Given 04/19/17 0029)  acetaminophen (TYLENOL) tablet 650 mg (not administered)    Or  acetaminophen (TYLENOL) solution 650 mg (not administered)    Or  acetaminophen (TYLENOL) suppository 650 mg (not administered)  senna-docusate (Senokot-S) tablet 1 tablet (not administered)  ampicillin (OMNIPEN) 2 g in sodium chloride 0.9 % 50 mL IVPB (not administered)  cefTRIAXone (ROCEPHIN) 1 g in dextrose 5 % 50 mL IVPB (1 g Intravenous New Bag/Given 04/19/17 0030)  vancomycin (VANCOCIN) 1,500 mg in sodium chloride 0.9 % 500 mL IVPB (not administered)  vancomycin (VANCOCIN) IVPB 750 mg/150 ml premix (not administered)  ampicillin (OMNIPEN) 2 g in sodium chloride 0.9 % 50 mL IVPB (not administered)  cefTRIAXone (ROCEPHIN) 2 g in dextrose 5 % 50 mL IVPB  (not administered)  sodium chloride 0.9 % bolus 1,000 mL (0 mLs Intravenous Stopped 04/18/17 2138)  cefTRIAXone (ROCEPHIN) 1 g in dextrose 5 % 50 mL IVPB (0 g Intravenous Stopped 04/18/17 2208)  potassium chloride 10 mEq in 100 mL IVPB (0 mEq Intravenous Stopped 04/18/17 2333)  dexamethasone (DECADRON) injection 10 mg (10 mg Intravenous Given 04/19/17 0026)  potassium chloride SA (K-DUR,KLOR-CON) CR tablet 40 mEq (40 mEq Oral Given 04/19/17 0018)     Initial Impression / Assessment and Plan / ED Course  I have reviewed the triage vital signs and the nursing notes.  Pertinent labs & imaging results that were available during my care of the patient were reviewed by me and considered in my medical decision making (see chart for details).     Etiology of patient's altered mental status undetermined after workup. She did have leukocytosis with elevated lactic acid and treated empirically with Rocephin however there is no source of infection identified. No evidence of hyper-calcium or other significant electrolyte derangements. CT head without evidence of ICH.   Case discussed with hospitalist for admission for further workup and management.  Final Clinical Impressions(s) / ED Diagnoses   Final diagnoses:  Leukocytosis  Disorientation     Erol Flanagin, Grayce Sessions, MD 04/19/17 [phone]

## 2017-04-19 ENCOUNTER — Inpatient Hospital Stay (HOSPITAL_COMMUNITY): Payer: Medicare Other

## 2017-04-19 DIAGNOSIS — R9431 Abnormal electrocardiogram [ECG] [EKG]: Secondary | ICD-10-CM | POA: Diagnosis present

## 2017-04-19 DIAGNOSIS — E876 Hypokalemia: Secondary | ICD-10-CM | POA: Diagnosis present

## 2017-04-19 DIAGNOSIS — G9340 Encephalopathy, unspecified: Secondary | ICD-10-CM | POA: Diagnosis present

## 2017-04-19 LAB — MAGNESIUM: Magnesium: 1.8 mg/dL (ref 1.7–2.4)

## 2017-04-19 LAB — LACTIC ACID, PLASMA
LACTIC ACID, VENOUS: 1.6 mmol/L (ref 0.5–1.9)
Lactic Acid, Venous: 1.2 mmol/L (ref 0.5–1.9)

## 2017-04-19 LAB — LIPID PANEL
Cholesterol: 123 mg/dL (ref 0–200)
HDL: 50 mg/dL (ref >40)
LDL Cholesterol: 59 mg/dL (ref 0–99)
TRIGLYCERIDES: 71 mg/dL (ref <150)
Total CHOL/HDL Ratio: 2.5 RATIO
VLDL: 14 mg/dL (ref 0–40)

## 2017-04-19 LAB — HEMOGLOBIN A1C
HEMOGLOBIN A1C: 5.6 % (ref 4.8–5.6)
Mean Plasma Glucose: 114.02 mg/dL

## 2017-04-19 LAB — TSH: TSH: 0.546 u[IU]/mL (ref 0.350–4.500)

## 2017-04-19 LAB — PROCALCITONIN

## 2017-04-19 MED ORDER — ENSURE ENLIVE PO LIQD
237.0000 mL | Freq: Two times a day (BID) | ORAL | Status: DC
  Administered 2017-04-20 – 2017-04-22 (×5): 237 mL via ORAL

## 2017-04-19 MED ORDER — LORAZEPAM 2 MG/ML IJ SOLN
0.5000 mg | Freq: Once | INTRAMUSCULAR | Status: AC
  Administered 2017-04-19: 0.5 mg via INTRAVENOUS
  Filled 2017-04-19: qty 1

## 2017-04-19 NOTE — ED Notes (Signed)
Pt continues to be agitated and confused. Pulled IV out and pulls at leads, wires despite re-orientation and medication. Informed Smith, MD. Placed pt's hands in mitten hand restraints in order to keep pt on monitor and preserve existing IV access.

## 2017-04-19 NOTE — Progress Notes (Signed)
PROGRESS NOTE [facility]   Rachel Conner   ZSW:[id number] DOB: [birth year]  DOA: 04/18/2017 PCP: Ma Hillock, DO   Brief Narrative:  Rachel Conner is a 81 year old female with medical history significant for hypertension, CAD status post CABG, anxiety, depression, DVT on Xarelto and carcinoid adenocarcinoma status post resection. Patient was brought by EMS after found on her chart with confusion. Per family she was noted to be well about 3-4 days ago. Over the past 2-4 days she has been having difficulty speaking and confused, daughter notes this over conversations over the phone. Prior to that patient able to do all he ADL's with no issues, no troubles with memory and carry on a conversation very well. Patient was admitted with working diagnosis of stroke vs CNS infection. LP was not done as patient is on Xarelto for DVT. CT head did not show acute abnormalities. MRI is pending. Patient was started on empiric abx (Vanc, Amp, Rocephin)   Subjective: Patient was seen and examined with daughter in law at bedside. Patient is only oriented x 2. Per daughter in law she is not at baseline, but seem to be improved from yesterday.   Assessment & Plan: Acute metabolic encephalopathy - unclear cause at this time  Patient continues to be confused, although per family member slight improved. CT head negative, MRI pending, there was a concern for CNS infection and patient has been started on empiric abx - I have low suspicious for meningitis, although unable to relate fevers given confusion there is no other significant finding that correlate clinically. Only mild elevated WBC, afebrile during hospital stay, blood cx no growth, no meningeal symptoms (nuchal rigidity or headaches). Given patient is not able to get an LP as she is on xarelto will wait for MRI to d/c abx. If there is no leptomeningeal enhancement. Other infectious process ruled out CXR and UA negative. Diff include dehydration,  worsening depression as patient is not complaint with medication, electrolyte imbalance (K low) CVA, B12 def. And worsening dementia. Continue to monitor, continue IVF and abx until MRI is back  I discussed case with neurology and ID both recommending to call back pending MRI if needed   Hypokalemia  Replete  Check BMP and Mag in AM   Hypothyroidism  TSH normal  Continue Synthroid   HTN  Above goal if no stroke on MRI will adjust medications   Depression  No SI/HI  Apparently has not been complaint with medications, daughter notice significant changes when she does not take lexapro   Hx of DVT  On Xarelto unclear why on 15 mg instead of 20 which is dose for DVT   DVT prophylaxis: Xarelto  Code Status: Full  Family Communication: Daughter at bedside and via phone call  Disposition Plan: TBD  Consultants:   None   Procedures:   None   Antimicrobials: Anti-infectives    Start     Dose/Rate Route Frequency Ordered Stop   04/19/17 1400  vancomycin (VANCOCIN) IVPB 750 mg/150 ml premix     750 mg 150 mL/hr over 60 Minutes Intravenous Every 12 hours 04/18/17 2356     04/19/17 1000  cefTRIAXone (ROCEPHIN) 2 g in dextrose 5 % 50 mL IVPB     2 g 100 mL/hr over 30 Minutes Intravenous Every 12 hours 04/18/17 2356     04/19/17 0600  ampicillin (OMNIPEN) 2 g in sodium chloride 0.9 % 50 mL IVPB     2 g 150 mL/hr over 20  Minutes Intravenous Every 6 hours 04/18/17 2356     04/19/17 0000  cefTRIAXone (ROCEPHIN) 1 g in dextrose 5 % 50 mL IVPB     1 g 100 mL/hr over 30 Minutes Intravenous  Once 04/18/17 2353 04/19/17 0105   04/19/17 0000  vancomycin (VANCOCIN) 1,500 mg in sodium chloride 0.9 % 500 mL IVPB     1,500 mg 250 mL/hr over 120 Minutes Intravenous  Once 04/18/17 2356 04/19/17 0335   04/18/17 2345  cefTRIAXone (ROCEPHIN) 2 g in dextrose 5 % 50 mL IVPB  Status:  Discontinued     2 g 100 mL/hr over 30 Minutes Intravenous  Once 04/18/17 2341 04/18/17 2353   04/18/17 2345   vancomycin (VANCOCIN) IVPB 1000 mg/200 mL premix  Status:  Discontinued     1,000 mg 200 mL/hr over 60 Minutes Intravenous  Once 04/18/17 2341 04/18/17 2352   04/18/17 2345  ampicillin (OMNIPEN) 2 g in sodium chloride 0.9 % 50 mL IVPB     2 g 150 mL/hr over 20 Minutes Intravenous  Once 04/18/17 2341 04/19/17 0134   04/18/17 2100  cefTRIAXone (ROCEPHIN) 1 g in dextrose 5 % 50 mL IVPB     1 g 100 mL/hr over 30 Minutes Intravenous  Once 04/18/17 2047 04/18/17 2208           Objective: Vitals:   04/19/17 0135 04/19/17 0233 04/19/17 0442 04/19/17 0818  BP: (!) 177/103 (!) 186/80 (!) 183/81 (!) 177/150  Pulse:  80 78 75  Resp: 14 17 17 17   Temp:  98 F (36.7 C) 97.7 F (36.5 C) 98 F (36.7 C)  TempSrc:      SpO2: 97% 98% 100% 96%  Weight:      Height:        Intake/Output Summary (Last 24 hours) at 04/19/17 1135 Last data filed at 04/19/17 0600  Gross per 24 hour  Intake             2550 ml  Output              850 ml  Net             1700 ml   Filed Weights   04/18/17 1907  Weight: 81.6 kg (180 lb)    Examination:  General exam: Appears calm and comfortable  HEENT: AC/AT, PERRLA, OP moist and clear, no nuchal rigidity  Respiratory system: Clear to auscultation. No wheezes,crackle or rhonchi Cardiovascular system: S1 & S2 heard, RRR. No JVD, murmurs, rubs or gallops Gastrointestinal system: Abdomen is nondistended, soft and nontender. Normal bowel sounds heard. Central nervous system: Orient to place and person only, no focal deficits noted, CN grossly intact, follow commands. Extremities: No pedal edema. Symmetric, strength 5/5   Skin: No rashes Psychiatry: Unable to perform   Data Reviewed: I have personally reviewed following labs and imaging studies  CBC:  Recent Labs Lab 04/18/17 1915  WBC 14.0*  NEUTROABS 11.8*  HGB 12.5  HCT 38.8  MCV 89.2  PLT 657   Basic Metabolic Panel:  Recent Labs Lab 04/18/17 1915 04/19/17 0141  NA 142  --   K 2.9*   --   CL 107  --   CO2 23  --   GLUCOSE 119*  --   BUN 20  --   CREATININE 1.04*  --   CALCIUM 8.6*  --   MG  --  1.8   GFR: Estimated Creatinine Clearance: 46.3 mL/min (A) (by C-G formula based on  SCr of 1.04 mg/dL (H)). Liver Function Tests:  Recent Labs Lab 04/18/17 1915  AST 24  ALT 14  ALKPHOS 34*  BILITOT 1.7*  PROT 5.9*  ALBUMIN 3.5   No results for input(s): LIPASE, AMYLASE in the last 168 hours.  Recent Labs Lab 04/18/17 1915  AMMONIA 14   Coagulation Profile: No results for input(s): INR, PROTIME in the last 168 hours. Cardiac Enzymes: No results for input(s): CKTOTAL, CKMB, CKMBINDEX, TROPONINI in the last 168 hours. BNP (last 3 results) No results for input(s): PROBNP in the last 8760 hours. HbA1C:  Recent Labs  04/19/17 0710  HGBA1C 5.6   CBG: No results for input(s): GLUCAP in the last 168 hours. Lipid Profile:  Recent Labs  04/19/17 0710  CHOL 123  HDL 50  LDLCALC 59  TRIG 71  CHOLHDL 2.5   Thyroid Function Tests:  Recent Labs  04/19/17 0141  TSH 0.546   Anemia Panel: No results for input(s): VITAMINB12, FOLATE, FERRITIN, TIBC, IRON, RETICCTPCT in the last 72 hours. Sepsis Labs:  Recent Labs Lab 04/18/17 1940 04/19/17 0141 04/19/17 0300  PROCALCITON  --  <0.10  --   LATICACIDVEN 2.35* 1.6 1.2    No results found for this or any previous visit (from the past 240 hour(s)).    Radiology Studies: Dg Chest 2 View  Result Date: 04/18/2017 CLINICAL DATA:  Leukocytosis.  Congestion. EXAM: CHEST  2 VIEW COMPARISON:  12/11/2015 FINDINGS: The lungs are clear. The pulmonary vasculature is normal. Heart size is normal. Hilar and mediastinal contours are unremarkable. There is no pleural effusion. IMPRESSION: No active cardiopulmonary disease. Electronically Signed   By: Andreas Newport M.D.   On: 04/18/2017 21:26   Ct Head Wo Contrast  Result Date: 04/18/2017 CLINICAL DATA:  Confusion. EXAM: CT HEAD WITHOUT CONTRAST TECHNIQUE:  Contiguous axial images were obtained from the base of the skull through the vertex without intravenous contrast. COMPARISON:  07/22/2016 FINDINGS: Brain: There is no evidence for acute hemorrhage, hydrocephalus, mass lesion, or abnormal extra-axial fluid collection. No definite CT evidence for acute infarction. Diffuse loss of parenchymal volume is consistent with atrophy. Patchy low attenuation in the deep hemispheric and periventricular white matter is nonspecific, but likely reflects chronic microvascular ischemic demyelination. Stable appearance lacunar infarcts in the bilateral splenic nuclei. Vascular: No hyperdense vessel or unexpected calcification. Skull: No evidence for fracture. No worrisome lytic or sclerotic lesion. Sinuses/Orbits: The visualized paranasal sinuses and mastoid air cells are clear. Visualized portions of the globes and intraorbital fat are unremarkable. Other: None. IMPRESSION: 1. Stable.  No acute intracranial abnormality. 2. Atrophy with chronic small vessel white matter ischemic disease. Electronically Signed   By: Misty Stanley M.D.   On: 04/18/2017 19:58      Scheduled Meds: . amLODipine  10 mg Oral Daily  . atorvastatin  80 mg Oral q1800  . escitalopram  20 mg Oral QPM  . levothyroxine  100 mcg Oral QAC breakfast  . Melatonin  9 mg Oral QHS  . pantoprazole  40 mg Oral Daily  . Rivaroxaban  15 mg Oral Q supper   Continuous Infusions: . sodium chloride 75 mL/hr at 04/19/17 0029  . ampicillin (OMNIPEN) IV Stopped (04/19/17 0806)  . cefTRIAXone (ROCEPHIN)  IV Stopped (04/19/17 1154)  . vancomycin       LOS: 1 day    Time spent: Total of 25 minutes spent with pt, greater than 50% of which was spent in discussion of  treatment, counseling and coordination of  care    Chipper Oman, MD Pager: Text Page via [email or web address]   If 7PM-7AM, please contact night-coverage [email or web address] 04/19/2017, 11:35 AM

## 2017-04-19 NOTE — Progress Notes (Signed)
Initial Nutrition Assessment  DOCUMENTATION CODES:   Not applicable  INTERVENTION:   -Ensure Enlive po BID, each supplement provides 350 kcal and 20 grams of protein  NUTRITION DIAGNOSIS:   Inadequate oral intake related to lethargy/confusion as evidenced by meal completion < 50%.  GOAL:   Patient will meet greater than or equal to 90% of their needs  MONITOR:   PO intake, Supplement acceptance, Labs, Weight trends, Skin, I & O's  REASON FOR ASSESSMENT:   Malnutrition Screening Tool    ASSESSMENT:   BRENDAN GRUWELL is a 81 y.o. 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 Burr Medico of oncology ; who presents after being found sitting in her yard picking grass altered.   Pt admitted with acute encephalopathy and sepsis.   Pt sleeping soundly at time of visit; did not arouse to participate in interview.   Observed breakfast meal tray- pt consumed 100% of oatmeal. Meal completion 40-60%.   Reviewed wt hx; pt has experienced a 4.8% wt loss over the past 6 months, which is not significant for time frame.  Nutrition-Focused physical exam completed. Findings are no fat depletion, no muscle depletion, and no edema.   Labs reviewed.   Diet Order:  Diet Heart Room service appropriate? Yes; Fluid consistency: Thin  Skin:  Reviewed, no issues  Last BM:  PTA  Height:   Ht Readings from Last 1 Encounters:  04/18/17 5\' 5"  (1.651 m)    Weight:   Wt Readings from Last 1 Encounters:  04/18/17 180 lb (81.6 kg)    Ideal Body Weight:  54.5 kg  BMI:  Body mass index is 29.95 kg/m.  Estimated Nutritional Needs:   Kcal:  1650-1850  Protein:  80-95 grams  Fluid:  1.6-1.8 L  EDUCATION NEEDS:   Education needs no appropriate at this time  Kaiyan Luczak A. Jimmye Norman, RD, LDN, CDE Pager: [phone] After hours Pager: [phone]

## 2017-04-19 NOTE — Progress Notes (Signed)
Pt admitted from the ER to [place] is alert and oriented to person, disoriented otherwise. Please follow neuro flowsheet. VSS, BP elevated, MD paged. Skin assessment completed. Pt in low bed for high fall risk. Telesitter in place for increased restlessness and anxiety. Pt with mitts in place, constantly pulling at them. Bed check in place. Call light within reach. White identification bracelet in place. This RN attempted to orient pt to unit/equipment/and rules but will need reorientation, no signs of learning. WCTM

## 2017-04-20 ENCOUNTER — Inpatient Hospital Stay (HOSPITAL_COMMUNITY): Payer: Medicare Other

## 2017-04-20 DIAGNOSIS — G9340 Encephalopathy, unspecified: Secondary | ICD-10-CM

## 2017-04-20 LAB — T4, FREE: FREE T4: 1.03 ng/dL (ref 0.61–1.12)

## 2017-04-20 LAB — BASIC METABOLIC PANEL
Anion gap: 13 (ref 5–15)
Anion gap: 10 (ref 5–15)
BUN: 10 mg/dL (ref 6–20)
BUN: 11 mg/dL (ref 6–20)
CALCIUM: 9.1 mg/dL (ref 8.9–10.3)
CALCIUM: 9 mg/dL (ref 8.9–10.3)
CO2: 28 mmol/L (ref 22–32)
CO2: 26 mmol/L (ref 22–32)
CREATININE: 0.64 mg/dL (ref 0.44–1.00)
CREATININE: 0.69 mg/dL (ref 0.44–1.00)
Chloride: 99 mmol/L — ABNORMAL LOW (ref 101–111)
Chloride: 103 mmol/L (ref 101–111)
GFR calc Af Amer: >60 mL/min (ref >60)
GFR calc non Af Amer: >60 mL/min (ref >60)
GLUCOSE: 110 mg/dL — AB (ref 65–99)
GLUCOSE: 142 mg/dL — AB (ref 65–99)
Potassium: 2.5 mmol/L — CL (ref 3.5–5.1)
Potassium: 2.6 mmol/L — CL (ref 3.5–5.1)
Sodium: 140 mmol/L (ref 135–145)
Sodium: 139 mmol/L (ref 135–145)

## 2017-04-20 LAB — CBC WITH DIFFERENTIAL/PLATELET
BASOS PCT: 0 %
Basophils Absolute: 0 10*3/uL (ref 0.0–0.1)
Eosinophils Absolute: 0.1 10*3/uL (ref 0.0–0.7)
Eosinophils Relative: 1 %
HEMATOCRIT: 43.2 % (ref 36.0–46.0)
Hemoglobin: 14.2 g/dL (ref 12.0–15.0)
LYMPHS PCT: 17 %
Lymphs Abs: 2.6 10*3/uL (ref 0.7–4.0)
MCH: 29 pg (ref 26.0–34.0)
MCHC: 32.9 g/dL (ref 30.0–36.0)
MCV: 88.2 fL (ref 78.0–100.0)
Monocytes Absolute: 1.4 10*3/uL — ABNORMAL HIGH (ref 0.1–1.0)
Monocytes Relative: 9 %
NEUTROS PCT: 73 %
Neutro Abs: 11.4 10*3/uL — ABNORMAL HIGH (ref 1.7–7.7)
PLATELETS: 316 10*3/uL (ref 150–400)
RBC: 4.9 MIL/uL (ref 3.87–5.11)
RDW: 14.7 % (ref 11.5–15.5)
WBC: 15.4 10*3/uL — AB (ref 4.0–10.5)

## 2017-04-20 LAB — VITAMIN B12: VITAMIN B 12: 637 pg/mL (ref 180–914)

## 2017-04-20 LAB — MAGNESIUM: Magnesium: 1.8 mg/dL (ref 1.7–2.4)

## 2017-04-20 MED ORDER — ATORVASTATIN CALCIUM 40 MG PO TABS
40.0000 mg | ORAL_TABLET | Freq: Every day | ORAL | Status: DC
  Administered 2017-04-20 – 2017-04-22 (×3): 40 mg via ORAL
  Filled 2017-04-20 (×3): qty 1

## 2017-04-20 MED ORDER — POTASSIUM CHLORIDE CRYS ER 20 MEQ PO TBCR
40.0000 meq | EXTENDED_RELEASE_TABLET | ORAL | Status: AC
  Administered 2017-04-20 (×2): 40 meq via ORAL
  Filled 2017-04-20 (×2): qty 2

## 2017-04-20 MED ORDER — POTASSIUM CHLORIDE 10 MEQ/100ML IV SOLN
10.0000 meq | INTRAVENOUS | Status: DC
  Administered 2017-04-20 (×3): 10 meq via INTRAVENOUS
  Filled 2017-04-20 (×2): qty 100

## 2017-04-20 MED ORDER — POTASSIUM CHLORIDE 10 MEQ/100ML IV SOLN
10.0000 meq | INTRAVENOUS | Status: AC
  Administered 2017-04-20: 10 meq via INTRAVENOUS
  Filled 2017-04-20 (×2): qty 100

## 2017-04-20 MED ORDER — FAMOTIDINE 20 MG PO TABS
20.0000 mg | ORAL_TABLET | Freq: Every day | ORAL | Status: DC
  Administered 2017-04-22: 20 mg via ORAL
  Filled 2017-04-20 (×2): qty 1

## 2017-04-20 NOTE — Progress Notes (Signed)
Bedside EEG completed, results pending. 

## 2017-04-20 NOTE — Progress Notes (Addendum)
[facility] Progress Note  Patient: Rachel Conner TOI:[id number]   PCP: Howard Pouch A, DO DOB: Jul 22, 1935   DOA: 04/18/2017   DOS: 04/20/2017   Date of Service: the patient was seen and examined on 04/20/2017  Subjective: Patient drowsy and lethargic, denies any acute complaint. No acute events overnight. Able to tolerate oral diet.  Brief hospital course: Pt. with PMH of HTN, CAD S/P CABG, anxiety, depression, DVT on Xarelto, carcinoid tumor S/P resection; admitted on 04/18/2017, presented with complaint of confusion, was found to have hypokalemia and acute metabolic encephalopathy. Currently further plan is continue to correct hypokalemia.  Assessment and Plan: 1. Acute metabolic encephalopathy. 2 days prior to admission was normal, but presented with confusion, slurred speech. CT head no acute intracranial abnormality MRI brain MRA head no acute findings, chronic small vessel ischemia. Blood cultures no growth until now  UDS negative TSH and free T4 normal, B-12 normal, ammonia normal, UA shows no evidence of pyuria. Pro-calcitonin normal as well. Thus infectious diseases workup so far is negative, patient presented with severe hypokalemia unsure whether that is contributing to patient's presentation but patient is getting better with correction. Continue to monitor in the hospital for now. PTOT consults pending. Avoiding any psychotropic medication including the lorazepam, Lexapro  2. Severe hypokalemia. Etiology unclear, no evidence of GI losses. Patient is taking Nexium at home and Protonix here Changing to Pepcid. Repeat potassium this afternoon still low, we'll replace aggressively with 80 mg of oral potassium and 40 mg of IV potassium. Recheck tomorrow morning.  3. Hypothyroidism. Continuing Synthroid.  4. Essential hypertension. Blood pressure relatively stable, continue Norvasc.  5. History of DVT. On Xarelto at home, continuing home  dose.  6.Dyslipidemia On 80 mg Lipitor since admission. LDL fasting 59, cholesterol 123. Last workup as an outpatient LDL 61, cholesterol 144. I would reduce patient's Lipitor from 80 mg 40 mg daily.  Diet: Cardiac diet DVT Prophylaxis: on therapeutic anticoagulation.  Advance goals of care discussion: full code  Family Communication: no family was present at bedside, at the time of interview.  Disposition:  Discharge to be determined.  Consultants: none Procedures: EEG  Antibiotics: Anti-infectives    Start     Dose/Rate Route Frequency Ordered Stop   04/19/17 1400  vancomycin (VANCOCIN) IVPB 750 mg/150 ml premix  Status:  Discontinued     750 mg 150 mL/hr over 60 Minutes Intravenous Every 12 hours 04/18/17 2356 04/19/17 2155   04/19/17 1000  cefTRIAXone (ROCEPHIN) 2 g in dextrose 5 % 50 mL IVPB  Status:  Discontinued     2 g 100 mL/hr over 30 Minutes Intravenous Every 12 hours 04/18/17 2356 04/19/17 2155   04/19/17 0600  ampicillin (OMNIPEN) 2 g in sodium chloride 0.9 % 50 mL IVPB  Status:  Discontinued     2 g 150 mL/hr over 20 Minutes Intravenous Every 6 hours 04/18/17 2356 04/19/17 2155   04/19/17 0000  cefTRIAXone (ROCEPHIN) 1 g in dextrose 5 % 50 mL IVPB     1 g 100 mL/hr over 30 Minutes Intravenous  Once 04/18/17 2353 04/19/17 0105   04/19/17 0000  vancomycin (VANCOCIN) 1,500 mg in sodium chloride 0.9 % 500 mL IVPB     1,500 mg 250 mL/hr over 120 Minutes Intravenous  Once 04/18/17 2356 04/19/17 0335   04/18/17 2345  cefTRIAXone (ROCEPHIN) 2 g in dextrose 5 % 50 mL IVPB  Status:  Discontinued     2 g 100 mL/hr over 30 Minutes Intravenous  Once 04/18/17 2341 04/18/17 2353   04/18/17 2345  vancomycin (VANCOCIN) IVPB 1000 mg/200 mL premix  Status:  Discontinued     1,000 mg 200 mL/hr over 60 Minutes Intravenous  Once 04/18/17 2341 04/18/17 2352   04/18/17 2345  ampicillin (OMNIPEN) 2 g in sodium chloride 0.9 % 50 mL IVPB     2 g 150 mL/hr over 20 Minutes Intravenous   Once 04/18/17 2341 04/19/17 0134   04/18/17 2100  cefTRIAXone (ROCEPHIN) 1 g in dextrose 5 % 50 mL IVPB     1 g 100 mL/hr over 30 Minutes Intravenous  Once 04/18/17 2047 04/18/17 2208       Objective: Physical Exam: Vitals:   04/19/17 1453 04/19/17 1958 04/20/17 0510 04/20/17 0919  BP: (!) 165/73 (!) 162/66 (!) 149/78 (!) 131/54  Pulse: 84 79 74 75  Resp: 18 18 18 13   Temp: 98.2 F (36.8 C) 98.8 F (37.1 C) 98.5 F (36.9 C) 98.3 F (36.8 C)  TempSrc:  Oral  Axillary  SpO2: 94% 91%  93%  Weight:      Height:        Intake/Output Summary (Last 24 hours) at 04/20/17 1618 Last data filed at 04/20/17 1405  Gross per 24 hour  Intake             2112 ml  Output                0 ml  Net             2112 ml   Filed Weights   04/18/17 1907  Weight: 81.6 kg (180 lb)   General: Drowsy and lethargic. Appear in moderate distress, affect difficult to assess  Eyes: PERRL, Conjunctiva normal ENT: Oral Mucosa clear moist. Neck: difficult to assess JVD, no Abnormal Mass Or lumps Cardiovascular: S1 and S2 Present, no Murmur, Peripheral Pulses Present Respiratory: normal respiratory effort, Bilateral Air entry equal and Decreased, no use of accessory muscle, Clear to Auscultation, no Crackles, no wheezes Abdomen: Bowel Sound present, Soft and no tenderness, no hernia Skin: no redness, no Rash, no induration Extremities: no Pedal edema, no calf tenderness Neurologic: Grossly no focal neuro deficit. Spontaneously moving all extremities, withdraws to painful stimulant, drowsy and lethargic, knee reflexes present Data Reviewed: CBC:  Recent Labs Lab 04/18/17 1915 04/20/17 0811  WBC 14.0* 15.4*  NEUTROABS 11.8* 11.4*  HGB 12.5 14.2  HCT 38.8 43.2  MCV 89.2 88.2  PLT 292 338   Basic Metabolic Panel:  Recent Labs Lab 04/18/17 1915 04/19/17 0141 04/20/17 0811 04/20/17 1512  NA 142  --  140 139  K 2.9*  --  2.5* 2.6*  CL 107  --  99* 103  CO2 23  --  28 26  GLUCOSE 119*  --   110* 142*  BUN 20  --  10 11  CREATININE 1.04*  --  0.64 0.69  CALCIUM 8.6*  --  9.1 9.0  MG  --  1.8 1.8  --     Liver Function Tests:  Recent Labs Lab 04/18/17 1915  AST 24  ALT 14  ALKPHOS 34*  BILITOT 1.7*  PROT 5.9*  ALBUMIN 3.5   No results for input(s): LIPASE, AMYLASE in the last 168 hours.  Recent Labs Lab 04/18/17 1915  AMMONIA 14   Coagulation Profile: No results for input(s): INR, PROTIME in the last 168 hours. Cardiac Enzymes: No results for input(s): CKTOTAL, CKMB, CKMBINDEX, TROPONINI in the last 168 hours. BNP (  last 3 results) No results for input(s): PROBNP in the last 8760 hours. CBG: No results for input(s): GLUCAP in the last 168 hours. Studies: Mr Brain Wo Contrast  Result Date: 04/19/2017 CLINICAL DATA:  Encephalopathy EXAM: MRI HEAD WITHOUT CONTRAST MRA HEAD WITHOUT CONTRAST TECHNIQUE: Multiplanar, multiecho pulse sequences of the brain and surrounding structures were obtained without intravenous contrast. Angiographic images of the head were obtained using MRA technique without contrast. COMPARISON:  Head CT from yesterday.  Brain MRI 03/20/2014 FINDINGS: MRI HEAD FINDINGS Brain: No acute infarction, hemorrhage, obstructive hydrocephalus, extra-axial collection or mass lesion. There is atrophy with ventriculomegaly. On sagittal imaging the corpus callosum appears up lifted but callosal angle is not narrowed and there is no definite asymmetric sulcal narrowing to strongly suggest communicating hydrocephalus. There is asymmetric right temporal horn dilatation that has been seen since 2015 and is nonspecific. Chronic small vessel ischemia with confluent gliosis around the lateral ventricles. Chronic ischemic change in the bilateral thalamus. Ischemic changes have progressed since 2015. Vascular: Arterial findings below. Normal dural venous sinus flow voids. Skull and upper cervical spine: No evidence of marrow lesion Sinuses/Orbits: No acute finding Other:  Intermittent significant motion degradation which could easily obscure pathology. MRA HEAD FINDINGS Motion degraded study, best obtainable. The major vessels are widely patent. Aplastic right A1 segment with large right posterior communicating artery and hypoplastic right P1 segment. No branch occlusion. No indication of aneurysm. IMPRESSION: Brain MRI: 1. No acute finding. 2. Chronic small vessel ischemia and atrophy that has progressed since 2015 comparison. 3. Motion degraded study, intermittently severe. Intracranial MRA: No acute finding or flow limiting proximal stenosis. Electronically Signed   By: Monte Fantasia M.D.   On: 04/19/2017 18:59   Mr Jodene Nam Head Wo Contrast  Result Date: 04/19/2017 CLINICAL DATA:  Encephalopathy EXAM: MRI HEAD WITHOUT CONTRAST MRA HEAD WITHOUT CONTRAST TECHNIQUE: Multiplanar, multiecho pulse sequences of the brain and surrounding structures were obtained without intravenous contrast. Angiographic images of the head were obtained using MRA technique without contrast. COMPARISON:  Head CT from yesterday.  Brain MRI 03/20/2014 FINDINGS: MRI HEAD FINDINGS Brain: No acute infarction, hemorrhage, obstructive hydrocephalus, extra-axial collection or mass lesion. There is atrophy with ventriculomegaly. On sagittal imaging the corpus callosum appears up lifted but callosal angle is not narrowed and there is no definite asymmetric sulcal narrowing to strongly suggest communicating hydrocephalus. There is asymmetric right temporal horn dilatation that has been seen since 2015 and is nonspecific. Chronic small vessel ischemia with confluent gliosis around the lateral ventricles. Chronic ischemic change in the bilateral thalamus. Ischemic changes have progressed since 2015. Vascular: Arterial findings below. Normal dural venous sinus flow voids. Skull and upper cervical spine: No evidence of marrow lesion Sinuses/Orbits: No acute finding Other: Intermittent significant motion degradation  which could easily obscure pathology. MRA HEAD FINDINGS Motion degraded study, best obtainable. The major vessels are widely patent. Aplastic right A1 segment with large right posterior communicating artery and hypoplastic right P1 segment. No branch occlusion. No indication of aneurysm. IMPRESSION: Brain MRI: 1. No acute finding. 2. Chronic small vessel ischemia and atrophy that has progressed since 2015 comparison. 3. Motion degraded study, intermittently severe. Intracranial MRA: No acute finding or flow limiting proximal stenosis. Electronically Signed   By: Monte Fantasia M.D.   On: 04/19/2017 18:59    Scheduled Meds: . amLODipine  10 mg Oral Daily  . atorvastatin  80 mg Oral q1800  . feeding supplement (ENSURE ENLIVE)  237 mL Oral BID BM  .  levothyroxine  100 mcg Oral QAC breakfast  . pantoprazole  40 mg Oral Daily  . potassium chloride  40 mEq Oral Q4H  . Rivaroxaban  15 mg Oral Q supper   Continuous Infusions: . potassium chloride     PRN Meds: acetaminophen **OR** acetaminophen (TYLENOL) oral liquid 160 mg/5 mL **OR** acetaminophen, senna-docusate  Time spent: 35 minutes  Author: Berle Mull, MD [facility] Pager: [phone] 04/20/2017 4:18 PM  If 7PM-7AM, please contact night-coverage at [email or web address], password [email or web address]

## 2017-04-20 NOTE — Procedures (Signed)
ELECTROENCEPHALOGRAM REPORT  Date of Study: 04/20/2017  Patient's Name: Rachel Conner MRN: [id number] Date of Birth: Jun 26, 1936  Referring Provider: Dr. Berle Mull  Clinical History: This is a 81 year old woman with altered mental status.  Medications: acetaminophen (TYLENOL) tablet 650 mg  amLODipine (NORVASC) tablet 10 mg  atorvastatin (LIPITOR) tablet 80 mg  feeding supplement (ENSURE ENLIVE) (ENSURE ENLIVE) liquid 237 mL  levothyroxine (SYNTHROID, LEVOTHROID) tablet 100 mcg  pantoprazole (PROTONIX) EC tablet 40 mg  Rivaroxaban (XARELTO) tablet 15 mg  senna-docusate (Senokot-S) tablet 1 tablet   Technical Summary: A multichannel digital EEG recording measured by the international 10-20 system with electrodes applied with paste and impedances below 5000 ohms performed as portable with EKG monitoring in an awake and drowsy patient.  Hyperventilation and photic stimulation were not performed.  The digital EEG was referentially recorded, reformatted, and digitally filtered in a variety of bipolar and referential montages for optimal display.   Description: The patient is awake and drowsy during the recording.  During maximal wakefulness, there is a medium voltage 7/5 Hz posterior dominant rhythm that poorly attenuates with eye opening and eye closure. This is admixed with a mild to moderate amount of diffuse 4-5 Hz theta and 2-3 Hz delta slowing of the waking background.  During drowsiness, there is an increase in theta and delta slowing of the background.  Deeper stages of sleep were not seen.  Hyperventilation and photic stimulation were not performed. There were no epileptiform discharges or electrographic seizures seen.    EKG lead showed occasional extrasystolic beats.  Impression: This awake and drowsy EEG is abnormal due to mild to moderate diffuse slowing of the waking background.  Clinical Correlation of the above findings indicates diffuse cerebral dysfunction that is  non-specific in etiology and can be seen with hypoxic/ischemic injury, toxic/metabolic encephalopathies, neurodegenerative disorders, or medication effect.  The absence of epileptiform discharges does not rule out a clinical diagnosis of epilepsy.  Clinical correlation is advised.   Ellouise Newer, M.D.

## 2017-04-21 LAB — COMPREHENSIVE METABOLIC PANEL
ALK PHOS: 45 U/L (ref 38–126)
ALT: 27 U/L (ref 14–54)
AST: 39 U/L (ref 15–41)
Albumin: 3.8 g/dL (ref 3.5–5.0)
Anion gap: 12 (ref 5–15)
BILIRUBIN TOTAL: 1.1 mg/dL (ref 0.3–1.2)
BUN: 12 mg/dL (ref 6–20)
CALCIUM: 9.4 mg/dL (ref 8.9–10.3)
CO2: 25 mmol/L (ref 22–32)
CREATININE: 0.63 mg/dL (ref 0.44–1.00)
Chloride: 102 mmol/L (ref 101–111)
GFR calc Af Amer: >60 mL/min (ref >60)
Glucose, Bld: 101 mg/dL — ABNORMAL HIGH (ref 65–99)
POTASSIUM: 3.1 mmol/L — AB (ref 3.5–5.1)
Sodium: 139 mmol/L (ref 135–145)
TOTAL PROTEIN: 6.7 g/dL (ref 6.5–8.1)

## 2017-04-21 LAB — CBC WITH DIFFERENTIAL/PLATELET
BASOS ABS: 0 10*3/uL (ref 0.0–0.1)
BASOS PCT: 0 %
EOS ABS: 0.3 10*3/uL (ref 0.0–0.7)
EOS PCT: 3 %
HCT: 42.1 % (ref 36.0–46.0)
Hemoglobin: 14.1 g/dL (ref 12.0–15.0)
Lymphocytes Relative: 24 %
Lymphs Abs: 2.7 10*3/uL (ref 0.7–4.0)
MCH: 29.3 pg (ref 26.0–34.0)
MCHC: 33.5 g/dL (ref 30.0–36.0)
MCV: 87.3 fL (ref 78.0–100.0)
Monocytes Absolute: 1.4 10*3/uL — ABNORMAL HIGH (ref 0.1–1.0)
Monocytes Relative: 12 %
Neutro Abs: 6.7 10*3/uL (ref 1.7–7.7)
Neutrophils Relative %: 61 %
PLATELETS: 308 10*3/uL (ref 150–400)
RBC: 4.82 MIL/uL (ref 3.87–5.11)
RDW: 14.7 % (ref 11.5–15.5)
WBC: 11.1 10*3/uL — AB (ref 4.0–10.5)

## 2017-04-21 LAB — MAGNESIUM: MAGNESIUM: 1.7 mg/dL (ref 1.7–2.4)

## 2017-04-21 MED ORDER — POTASSIUM CHLORIDE CRYS ER 20 MEQ PO TBCR
40.0000 meq | EXTENDED_RELEASE_TABLET | ORAL | Status: AC
  Administered 2017-04-21: 40 meq via ORAL
  Filled 2017-04-21: qty 2

## 2017-04-21 NOTE — Clinical Social Work Note (Signed)
Clinical Social Work Assessment  Patient Details  Name: Rachel Conner MRN: [id number] Date of Birth: 07-27-1935  Date of referral:  04/21/17               Reason for consult:  Facility Placement                Permission sought to share information with:  Facility Sport and exercise psychologist, Family Supports Permission granted to share information::  No  Name::     Chartered certified accountant::  SNFs  Relationship::  Daughter  Contact Information:  [phone]  Housing/Transportation Living arrangements for the past 2 months:  [place] of Information:  Adult Children Patient Interpreter Needed:  None Criminal Activity/Legal Involvement Pertinent to Current Situation/Hospitalization:  No - Comment as needed Significant Relationships:  Adult Children Lives with:  Self Do you feel safe going back to the place where you live?  No Need for family participation in patient care:  Yes (Comment)  Care giving concerns:  CSW received consult for possible SNF placement at time of discharge. CSW spoke with patient's daughter regarding PT recommendation of SNF placement at time of discharge. Patient reported that patient lives alone and given patient's current physical needs and fall risk, she needs help at discharge. She was set up to move to an ALF this week, but patient needs more assistance. Patient's daughter expressed understanding of PT recommendation and is agreeable to SNF placement at time of discharge. CSW to continue to follow and assist with discharge planning needs.   Social Worker assessment / plan:  CSW spoke with patient's daughter concerning possibility of rehab at [facility] before returning home.  Employment status:  Retired Forensic scientist:  Medicare PT Recommendations:  [place] / Referral to community resources:  [place]  Patient/Family's Response to care:  Patient's daughter recognizes need for rehab before going to and ALF and  is agreeable to a SNF in [place]. Patient reported preference for [facility] or [place] private room. CSW explained that it depends on availability.   Patient/Family's Understanding of and Emotional Response to Diagnosis, Current Treatment, and Prognosis:  Patient/family is realistic regarding therapy needs and expressed being hopeful for SNF placement. Patient's daughter expressed understanding of CSW role and discharge process as well as medical condition. No questions/concerns about plan or treatment.    Emotional Assessment Appearance:  Appears stated age Attitude/Demeanor/Rapport:  Unable to Assess Affect (typically observed):  Unable to Assess Orientation:  Oriented to Self Alcohol / Substance use:  Not Applicable Psych involvement (Current and /or in the community):  No (Comment)  Discharge Needs  Concerns to be addressed:  Care Coordination Readmission within the last 30 days:  No Current discharge risk:  None Barriers to Discharge:  Continued Medical Work up   [employer], [place] 04/21/2017, 3:06 PM

## 2017-04-21 NOTE — Progress Notes (Signed)
[facility] Progress Note  Patient: Rachel Conner KGU:[id number]   PCP: Howard Pouch A, DO DOB: 1935-11-28   DOA: 04/18/2017   DOS: 04/21/2017   Date of Service: the patient was seen and examined on 04/21/2017  Subjective: Less drowsy, still not communicative. No acute events overnight. Tolerating oral diet.  Brief hospital course: Pt. with PMH of HTN, CAD S/P CABG, anxiety, depression, DVT on Xarelto, carcinoid tumor S/P resection; admitted on 04/18/2017, presented with complaint of confusion, was found to have hypokalemia and acute metabolic encephalopathy. Currently further plan is continue to correct hypokalemia.  Assessment and Plan: 1. Acute metabolic encephalopathy. 2 days prior to admission was normal, but presented with confusion, slurred speech. CT head no acute intracranial abnormality MRI brain MRA head no acute findings, chronic small vessel ischemia. Blood cultures no growth until now  UDS negative TSH and free T4 normal, B-12 normal, ammonia normal, UA shows no evidence of pyuria. Pro-calcitonin normal as well. Thus infectious diseases workup so far is negative, patient presented with severe hypokalemia unsure whether that is contributing to patient's presentation but patient is getting better with correction. Continue to monitor in the hospital for now. PTOT consults recommends SNF Avoiding any psychotropic medication including the lorazepam, Lexapro  2. Severe hypokalemia. Etiology unclear, no evidence of GI losses. Patient is taking Nexium at home and Protonix here Changing to Pepcid. Still low, we will replace it and recheck tomorrow.  3. Hypothyroidism. Continuing Synthroid.  4. Essential hypertension. Blood pressure relatively stable, continue Norvasc.  5. History of DVT. On Xarelto at home, continuing home dose.  6.Dyslipidemia On 80 mg Lipitor since admission. LDL fasting 59, cholesterol 123. Last workup as an outpatient LDL 61, cholesterol  144. I would reduce patient's Lipitor from 80 mg 40 mg daily.  Diet: Cardiac diet DVT Prophylaxis: on therapeutic anticoagulation.  Advance goals of care discussion: full code  Family Communication: no family was present at bedside, at the time of interview.  Disposition:  Discharge to SNF that tomorrow.  Consultants: none Procedures: EEG  Antibiotics: Anti-infectives    Start     Dose/Rate Route Frequency Ordered Stop   04/19/17 1400  vancomycin (VANCOCIN) IVPB 750 mg/150 ml premix  Status:  Discontinued     750 mg 150 mL/hr over 60 Minutes Intravenous Every 12 hours 04/18/17 2356 04/19/17 2155   04/19/17 1000  cefTRIAXone (ROCEPHIN) 2 g in dextrose 5 % 50 mL IVPB  Status:  Discontinued     2 g 100 mL/hr over 30 Minutes Intravenous Every 12 hours 04/18/17 2356 04/19/17 2155   04/19/17 0600  ampicillin (OMNIPEN) 2 g in sodium chloride 0.9 % 50 mL IVPB  Status:  Discontinued     2 g 150 mL/hr over 20 Minutes Intravenous Every 6 hours 04/18/17 2356 04/19/17 2155   04/19/17 0000  cefTRIAXone (ROCEPHIN) 1 g in dextrose 5 % 50 mL IVPB     1 g 100 mL/hr over 30 Minutes Intravenous  Once 04/18/17 2353 04/19/17 0105   04/19/17 0000  vancomycin (VANCOCIN) 1,500 mg in sodium chloride 0.9 % 500 mL IVPB     1,500 mg 250 mL/hr over 120 Minutes Intravenous  Once 04/18/17 2356 04/19/17 0335   04/18/17 2345  cefTRIAXone (ROCEPHIN) 2 g in dextrose 5 % 50 mL IVPB  Status:  Discontinued     2 g 100 mL/hr over 30 Minutes Intravenous  Once 04/18/17 2341 04/18/17 2353   04/18/17 2345  vancomycin (VANCOCIN) IVPB 1000 mg/200 mL premix  Status:  Discontinued     1,000 mg 200 mL/hr over 60 Minutes Intravenous  Once 04/18/17 2341 04/18/17 2352   04/18/17 2345  ampicillin (OMNIPEN) 2 g in sodium chloride 0.9 % 50 mL IVPB     2 g 150 mL/hr over 20 Minutes Intravenous  Once 04/18/17 2341 04/19/17 0134   04/18/17 2100  cefTRIAXone (ROCEPHIN) 1 g in dextrose 5 % 50 mL IVPB     1 g 100 mL/hr over 30  Minutes Intravenous  Once 04/18/17 2047 04/18/17 2208       Objective: Physical Exam: Vitals:   04/20/17 2145 04/21/17 0425 04/21/17 0830 04/21/17 1230  BP: (!) 157/77 (!) 160/74 (!) 147/60 (!) 162/79  Pulse: 89 80 72 83  Resp: 17 18 18 18   Temp: 98 F (36.7 C) 99 F (37.2 C) 98.8 F (37.1 C) 98.4 F (36.9 C)  TempSrc: Oral Oral Oral   SpO2: 96% 97% 95% 93%  Weight:      Height:        Intake/Output Summary (Last 24 hours) at 04/21/17 1832 Last data filed at 04/21/17 1416  Gross per 24 hour  Intake              120 ml  Output              400 ml  Net             -280 ml   Filed Weights   04/18/17 1907  Weight: 81.6 kg (180 lb)   General: Awake and oriented 2. Appear in mild distress, affect difficult to assess  Eyes: PERRL, Conjunctiva normal ENT: Oral Mucosa clear moist. Neck: difficult to assess JVD, no Abnormal Mass Or lumps Cardiovascular: S1 and S2 Present, no Murmur, Peripheral Pulses Present Respiratory: normal respiratory effort, Bilateral Air entry equal and Decreased, no use of accessory muscle, Clear to Auscultation, no Crackles, no wheezes Abdomen: Bowel Sound present, Soft and no tenderness, no hernia Skin: no redness, no Rash, no induration Extremities: no Pedal edema, no calf tenderness Neurologic: Grossly no focal neuro deficit. Spontaneously moving all extremities, withdraws to painful stimulant, drowsy and lethargic, knee reflexes present Data Reviewed: CBC:  Recent Labs Lab 04/18/17 1915 04/20/17 0811 04/21/17 0735  WBC 14.0* 15.4* 11.1*  NEUTROABS 11.8* 11.4* 6.7  HGB 12.5 14.2 14.1  HCT 38.8 43.2 42.1  MCV 89.2 88.2 87.3  PLT 292 316 101   Basic Metabolic Panel:  Recent Labs Lab 04/18/17 1915 04/19/17 0141 04/20/17 0811 04/20/17 1512 04/21/17 0735  NA 142  --  140 139 139  K 2.9*  --  2.5* 2.6* 3.1*  CL 107  --  99* 103 102  CO2 23  --  28 26 25   GLUCOSE 119*  --  110* 142* 101*  BUN 20  --  10 11 12   CREATININE 1.04*  --   0.64 0.69 0.63  CALCIUM 8.6*  --  9.1 9.0 9.4  MG  --  1.8 1.8  --  1.7    Liver Function Tests:  Recent Labs Lab 04/18/17 1915 04/21/17 0735  AST 24 39  ALT 14 27  ALKPHOS 34* 45  BILITOT 1.7* 1.1  PROT 5.9* 6.7  ALBUMIN 3.5 3.8   No results for input(s): LIPASE, AMYLASE in the last 168 hours.  Recent Labs Lab 04/18/17 1915  AMMONIA 14   Coagulation Profile: No results for input(s): INR, PROTIME in the last 168 hours. Cardiac Enzymes: No results for input(s): CKTOTAL,  CKMB, CKMBINDEX, TROPONINI in the last 168 hours. BNP (last 3 results) No results for input(s): PROBNP in the last 8760 hours. CBG: No results for input(s): GLUCAP in the last 168 hours. Studies: No results found.  Scheduled Meds: . amLODipine  10 mg Oral Daily  . atorvastatin  40 mg Oral q1800  . famotidine  20 mg Oral Daily  . feeding supplement (ENSURE ENLIVE)  237 mL Oral BID BM  . levothyroxine  100 mcg Oral QAC breakfast  . Rivaroxaban  15 mg Oral Q supper   Continuous Infusions:  PRN Meds: acetaminophen **OR** acetaminophen (TYLENOL) oral liquid 160 mg/5 mL **OR** acetaminophen, senna-docusate  Time spent: 35 minutes  Author: Berle Mull, MD [facility] Pager: [phone] 04/21/2017 6:32 PM  If 7PM-7AM, please contact night-coverage at [email or web address], password [facility]

## 2017-04-21 NOTE — Care Management Note (Signed)
Case Management Note  Patient Details  Name: TAMILA GAULIN MRN: [id number] Date of Birth: 22-Nov-1935  Subjective/Objective:            Admitted secondary to [place]. MRI, MRA. PMH includes DVT, CHF, HTN, CAD s/p CABG, anxiety, depression, arthrits, colon cancer, MI, peripheral neuropathy, and back surgery. From home alone.       [name] (Daughter) [place] [phone]      PCP; Howard Pouch   Action/Plan: Per PT's recommnedation: SNF. CSW made aware. CM will continue to follow.  Expected Discharge Date:                  Expected Discharge Plan:  Skilled Nursing Facility  In-House Referral:  Clinical Social Work  Discharge planning Services  CM Consult  Post Acute Care Choice:    Choice offered to:     DME Arranged:    DME Agency:     HH Arranged:    [place] Agency:     Status of Service:  Completed, signed off  If discussed at [employer], dates discussed:    Additional Comments:  Sharin Mons, RN 04/21/2017, 2:54 PM

## 2017-04-21 NOTE — NC FL2 (Signed)
[place] LEVEL OF CARE SCREENING TOOL     IDENTIFICATION  Patient Name: Rachel Conner Birthdate: March 28, 1936 Sex: female Admission Date (Current Location): 04/18/2017  [facility] and [place] Number:  Herbalist and Address:  [facility] [place], [place] [address] [place], [place] [address]      Provider Number: [id number]  Attending Physician Name and Address:  Lavina Hamman, MD  Relative Name and Phone Number:  Ewell Poe, [phone]    Current Level of Care: Hospital Recommended Level of Care: [place] Prior Approval Number:    Date Approved/Denied:   PASRR Number: [id number]  Discharge Plan: SNF    Current Diagnoses: Patient Active Problem List   Diagnosis Date Noted  . Acute encephalopathy 04/19/2017  . Hypokalemia 04/19/2017  . Prolonged QT interval 04/19/2017  . Sepsis ([place]) 04/18/2017  . Chronic prescription benzodiazepine use 02/03/2017  . Essential hypertension 11/03/2016  . Chronic seasonal allergic rhinitis due to pollen 10/13/2016  . Gastroesophageal reflux disease without esophagitis 10/13/2016  . Depression with anxiety 10/13/2016  . Mixed carcinoid-adenocarcinoma of colon ([place]) 04/28/2015  . Vascular dementia 11/07/2013  . Long term current use of anticoagulant therapy 04/03/2013  . DVT (deep venous thrombosis) ([place]) 03/06/2013  . Hypothyroidism 05/20/2008  . HYPERCHOLESTEROLEMIA, MIXED 05/20/2008  . HYPERTENSIVE CARDIOVASCULAR DISEASE, BENIGN 05/20/2008  . CORONARY ATHEROSCLEROSIS, NATIVE VESSEL 05/20/2008  . ANEMIA 01/26/2008    Orientation RESPIRATION BLADDER Height & Weight     Self  Normal Incontinent Weight: 81.6 kg (180 lb) Height:  5\' 5"  (165.1 cm)  BEHAVIORAL SYMPTOMS/MOOD NEUROLOGICAL BOWEL NUTRITION STATUS      Incontinent Diet (Please see DC Summary)  AMBULATORY STATUS COMMUNICATION OF NEEDS Skin   Extensive Assist Verbally Normal                       Personal  Care Assistance Level of Assistance  Bathing, Feeding, Dressing Bathing Assistance: Maximum assistance Feeding assistance: Limited assistance Dressing Assistance: Limited assistance     Functional Limitations Info             SPECIAL CARE FACTORS FREQUENCY  PT (By licensed PT)     PT Frequency: 5x/week              Contractures      Additional Factors Info  Code Status, Allergies Code Status Info: Full Allergies Info: Niacin, Nitrogen, Nitroglycerin, Myrbetriq Mirabegron, Niaspan Niacin Er           Current Medications (04/21/2017):  This is the current hospital active medication list Current Facility-Administered Medications  Medication Dose Route Frequency Provider Last Rate Last Dose  . acetaminophen (TYLENOL) tablet 650 mg  650 mg Oral Q4H PRN Fuller Plan A, MD       Or  . acetaminophen (TYLENOL) solution 650 mg  650 mg Per Tube Q4H PRN Fuller Plan A, MD       Or  . acetaminophen (TYLENOL) suppository 650 mg  650 mg Rectal Q4H PRN Tamala Julian, Rondell A, MD      . amLODipine (NORVASC) tablet 10 mg  10 mg Oral Daily Fuller Plan A, MD   10 mg at 04/20/17 0923  . atorvastatin (LIPITOR) tablet 40 mg  40 mg Oral q1800 Lavina Hamman, MD   40 mg at 04/20/17 1735  . famotidine (PEPCID) tablet 20 mg  20 mg Oral Daily Lavina Hamman, MD      . feeding supplement (ENSURE ENLIVE) (  ENSURE ENLIVE) liquid 237 mL  237 mL Oral BID BM Patrecia Pour, Christean Grief, MD   237 mL at 04/21/17 1400  . levothyroxine (SYNTHROID, LEVOTHROID) tablet 100 mcg  100 mcg Oral QAC breakfast Fuller Plan A, MD   100 mcg at 04/20/17 0923  . potassium chloride SA (K-DUR,KLOR-CON) CR tablet 40 mEq  40 mEq Oral Q2H Lavina Hamman, MD   40 mEq at 04/21/17 1354  . Rivaroxaban (XARELTO) tablet 15 mg  15 mg Oral Q supper Fuller Plan A, MD   15 mg at 04/20/17 1735  . senna-docusate (Senokot-S) tablet 1 tablet  1 tablet Oral QHS PRN Norval Morton, MD         Discharge Medications: Please see  discharge summary for a list of discharge medications.  Relevant Imaging Results:  Relevant Lab Results:   Additional Information SSN: [address] [place], [place]

## 2017-04-21 NOTE — Evaluation (Signed)
Physical Therapy Evaluation Patient Details Name: Rachel Conner MRN: [id number] DOB: 1936-06-24 Today's Date: 04/21/2017   History of Present Illness  Pt is a 81 y/o female admitted secondary to AMS. MRI, MRA, and CT negative for acute abnormalities. EEG abnormal secondary to mild to moderate diffuse slowing of waking background. PMH includes DVT, CHF, HTN, CAD s/p CABG, anxiety, depression, arthrits, colon cancer, MI, peripheral neuropathy, and back surgery.   Clinical Impression  Pt admitted secondary to problem above with deficits below. Unsure of PLOF given pt's AMS. Will need to follow up with family to determine. Upon eval, pt with AMS, impulsivity, decreased safety awareness, weakness, decreased balance, and decreased motor sequencing. Required mod A and unable to take steps secondary to difficulty sequencing. When asked to take steps, pt stepping in place and unable to follow commands to side step. Pt did report some light headedness when she returned to supine. Pt currently a high fall risk. Recommending SNF at d/c to increase independence and safety with functional mobility. Will continue to follow acutely to maximize functional mobility independence and safety.     Follow Up Recommendations SNF;Supervision/Assistance - 24 hour    Equipment Recommendations  Wheelchair (measurements PT);Wheelchair cushion (measurements PT)    Recommendations for Other Services       Precautions / Restrictions Precautions Precautions: Fall Restrictions Weight Bearing Restrictions: No      Mobility  Bed Mobility Overal bed mobility: Needs Assistance Bed Mobility: Supine to Sit;Sit to Supine     Supine to sit: Mod assist Sit to supine: Min assist   General bed mobility comments: Mod A for sequencing during bed mobility. Difficulty following motor commands and difficulty sequencing noted during bed mobility.   Transfers Overall transfer level: Needs assistance Equipment used: Rolling  walker (2 wheeled);1 person hand held assist Transfers: Sit to/from Stand Sit to Stand: Mod assist         General transfer comment: Mod A for lift assist. Verbal cues for difficulty sequencing. Pt very impulsive and sitting down without cues X 3. Required mod A for controlled descent to sitting.   Ambulation/Gait             General Gait Details: Attempted gait st EOB. Difficulty with motor sequencing and following commands. When asked to side step, pt marched in place and then sat at EOB abruptly. Unable to attempt gait this session. PLaced RW in front and attempted gait, however, pt with difficulty sequencing with RW and unable to take steps.   Stairs            Wheelchair Mobility    Modified Rankin (Stroke Patients Only)       Balance Overall balance assessment: Needs assistance Sitting-balance support: No upper extremity supported;Feet supported Sitting balance-Leahy Scale: Fair     Standing balance support: Bilateral upper extremity supported;During functional activity;Single extremity supported Standing balance-Leahy Scale: Poor Standing balance comment: Reliant on UE support and external support for balance.                              Pertinent Vitals/Pain Pain Assessment: No/denies pain    Home Living Family/patient expects to be discharged to:: Private residence Living Arrangements: Alone   Type of Home: House Home Access: Stairs to enter   [employer] of Steps: not sure how many  Home Layout: Two level Home Equipment: Environmental consultant - 2 wheels;Cane - single point Additional Comments: Very limited history. Pt with  AMS and unsure if information is correct. Also demonstrated difficulty with answering questions.     Prior Function Level of Independence: Independent         Comments: Pt reports she was independent, however, unsure if this is correct given AMS.      Hand Dominance        Extremity/Trunk Assessment   Upper  Extremity Assessment Upper Extremity Assessment: Defer to OT evaluation    Lower Extremity Assessment Lower Extremity Assessment: Generalized weakness;RLE deficits/detail;LLE deficits/detail RLE Sensation: history of peripheral neuropathy RLE Coordination: decreased gross motor;decreased fine motor LLE Sensation: history of peripheral neuropathy LLE Coordination: decreased fine motor;decreased gross motor    Cervical / Trunk Assessment Cervical / Trunk Assessment: Kyphotic  Communication   Communication: Other (comment);Expressive difficulties (difficulty answering questions )  Cognition Arousal/Alertness: Awake/alert Behavior During Therapy: Flat affect Overall Cognitive Status: No family/caregiver present to determine baseline cognitive functioning Area of Impairment: Orientation;Attention;Memory;Safety/judgement;Awareness;Problem solving;Following commands                 Orientation Level: Disoriented to;Place;Time;Situation Current Attention Level: Focused Memory: Decreased recall of precautions;Decreased short-term memory Following Commands: Follows one step commands inconsistently;Follows one step commands with increased time Safety/Judgement: Decreased awareness of safety;Decreased awareness of deficits Awareness: Intellectual Problem Solving: Slow processing;Decreased initiation;Difficulty sequencing;Requires verbal cues;Requires tactile cues General Comments: Pt with difficulty following simple one step commands. Difficulty with motor planning noted. Slowed processing as well as pt had difficulty answering questions.       General Comments General comments (skin integrity, edema, etc.): No family present this session     Exercises     Assessment/Plan    PT Assessment Patient needs continued PT services  PT Problem List Decreased strength;Decreased balance;Decreased mobility;Decreased coordination;Decreased cognition;Decreased knowledge of use of DME;Decreased  safety awareness;Decreased knowledge of precautions       PT Treatment Interventions DME instruction;Gait training;Functional mobility training;Therapeutic activities;Therapeutic exercise;Balance training;Neuromuscular re-education;Cognitive remediation;Patient/family education    PT Goals (Current goals can be found in the Care Plan section)  Acute Rehab PT Goals Patient Stated Goal: none stated  PT Goal Formulation: Patient unable to participate in goal setting Time For Goal Achievement: 05/05/17 Potential to Achieve Goals: Fair    Frequency Min 2X/week   Barriers to discharge Decreased caregiver support Unsure of assist at home     Co-evaluation               AM-PAC PT "6 Clicks" Daily Activity  Outcome Measure Difficulty turning over in bed (including adjusting bedclothes, sheets and blankets)?: Unable Difficulty moving from lying on back to sitting on the side of the bed? : Unable Difficulty sitting down on and standing up from a chair with arms (e.g., wheelchair, bedside commode, etc,.)?: Unable Help needed moving to and from a bed to chair (including a wheelchair)?: Total Help needed walking in hospital room?: Total Help needed climbing 3-5 steps with a railing? : Total 6 Click Score: 6    End of Session Equipment Utilized During Treatment: Gait belt Activity Tolerance: Patient tolerated treatment well Patient left: in bed;with call bell/phone within reach;with nursing/sitter in room;with bed alarm set Nurse Communication: Mobility status PT Visit Diagnosis: Other abnormalities of gait and mobility (R26.89);Muscle weakness (generalized) (M62.81);Other symptoms and signs involving the nervous system (R29.898)    Time: 8546-2703 PT Time Calculation (min) (ACUTE ONLY): 18 min   Charges:   PT Evaluation $PT Eval Moderate Complexity: 1 Mod     PT G Codes:  Leighton Ruff, PT, DPT  Acute Rehabilitation Services  Pager: [phone]   Rudean Hitt 04/21/2017, 1:47 PM

## 2017-04-22 LAB — BASIC METABOLIC PANEL
ANION GAP: 7 (ref 5–15)
BUN: 19 mg/dL (ref 6–20)
CALCIUM: 9.3 mg/dL (ref 8.9–10.3)
CO2: 27 mmol/L (ref 22–32)
CREATININE: 0.7 mg/dL (ref 0.44–1.00)
Chloride: 107 mmol/L (ref 101–111)
Glucose, Bld: 108 mg/dL — ABNORMAL HIGH (ref 65–99)
Potassium: 3.6 mmol/L (ref 3.5–5.1)
SODIUM: 141 mmol/L (ref 135–145)

## 2017-04-22 LAB — CBC
HEMATOCRIT: 41.9 % (ref 36.0–46.0)
Hemoglobin: 13.3 g/dL (ref 12.0–15.0)
MCH: 28.4 pg (ref 26.0–34.0)
MCHC: 31.7 g/dL (ref 30.0–36.0)
MCV: 89.5 fL (ref 78.0–100.0)
PLATELETS: 278 10*3/uL (ref 150–400)
RBC: 4.68 MIL/uL (ref 3.87–5.11)
RDW: 15.2 % (ref 11.5–15.5)
WBC: 9.9 10*3/uL (ref 4.0–10.5)

## 2017-04-22 LAB — MAGNESIUM: MAGNESIUM: 1.8 mg/dL (ref 1.7–2.4)

## 2017-04-22 MED ORDER — ATORVASTATIN CALCIUM 40 MG PO TABS
40.0000 mg | ORAL_TABLET | Freq: Every day | ORAL | 0 refills | Status: AC
Start: 2017-04-22 — End: ?

## 2017-04-22 MED ORDER — ENSURE ENLIVE PO LIQD: 237.0000 mL | Freq: Two times a day (BID) | ORAL | 12 refills | Status: AC

## 2017-04-22 MED ORDER — FAMOTIDINE 20 MG PO TABS: 20.0000 mg | ORAL_TABLET | Freq: Every day | ORAL | 0 refills | Status: AC

## 2017-04-22 MED ORDER — POTASSIUM CHLORIDE ER 10 MEQ PO TBCR: 10.0000 meq | EXTENDED_RELEASE_TABLET | Freq: Every day | ORAL | 0 refills | Status: AC

## 2017-04-22 NOTE — Clinical Social Work Placement (Signed)
   CLINICAL SOCIAL WORK PLACEMENT  NOTE  Date:  04/22/2017  Patient Details  Name: Rachel Conner MRN: [id number] Date of Birth: 1936-02-12  Clinical Social Work is seeking post-discharge placement for this patient at the [place] level of care (*CSW will initial, date and re-position this form in  chart as items are completed):  Yes   Patient/family provided with [place] Work Department's list of facilities offering this level of care within the geographic area requested by the patient (or if unable, by the patient's family).  Yes   Patient/family informed of their freedom to choose among providers that offer the needed level of care, that participate in Medicare, Medicaid or managed care program needed by the patient, have an available bed and are willing to accept the patient.  Yes   Patient/family informed of [place]'s ownership interest in [facility] and [facility], as well as of the fact that they are under no obligation to receive care at these facilities.  PASRR submitted to EDS on       PASRR number received on       Existing PASRR number confirmed on 04/21/17     FL2 transmitted to all facilities in geographic area requested by pt/family on 04/21/17     FL2 transmitted to all facilities within larger geographic area on       Patient informed that his/her managed care company has contracts with or will negotiate with certain facilities, including the following:        Yes   Patient/family informed of bed offers received.  Patient chooses bed at [facility]     Physician recommends and patient chooses bed at      Patient to be transferred to [facility] on 04/22/17.  Patient to be transferred to facility by PTAR     Patient family notified on 04/22/17 of transfer.  Name of family member notified:  Daughter     PHYSICIAN Please sign FL2     Additional Comment:     _______________________________________________ Benard Halsted, [place] 04/22/2017, 3:53 PM

## 2017-04-22 NOTE — Progress Notes (Signed)
Pt resting in be quietly. Easily aroused. Nonverbal this shift. Tele box changed due to multiple issues and coordinated with telemetry to ensure that pt is properly on. neurochecks continue and remains unchanged. Pupil size is 60mm. Hand grasps are weak and feet are week. Unable to follow simple commands due to cognition. On low bed. Safety maintained. callbell within reach. Will continue to monitor.

## 2017-04-22 NOTE — Discharge Summary (Signed)
Report called to [facility]. Spoke with Mechele Claude. Pt to be picked up 1800 via ambulance. Patient is alert to self at times. Also made nurse aware that gave all med up to 1900. Xarelto 15mg  already given today.

## 2017-04-22 NOTE — Discharge Summary (Signed)
[facility] Discharge Summary   Patient: Rachel Conner JJK:[id number]   PCP: Ma Hillock, DO DOB: August 24, 1935   Date of admission: 04/18/2017   Date of discharge:  04/22/2017    Discharge Diagnoses:  Active Problems:   Hypothyroidism   Long term current use of anticoagulant therapy   Mixed carcinoid-adenocarcinoma of colon (HCC)   Sepsis ([place])   Acute encephalopathy   Hypokalemia   Prolonged QT interval   Admitted From: home Disposition:  SNF  Recommendations for Outpatient Follow-up:  1. Please follow up with PCP in 1 week to recheck BMP as well as adjust medication for mood disorder.    Follow-up Information    Kuneff, Renee A, DO. Schedule an appointment as soon as possible for a visit in 1 week(s).   Specialty:  Family Medicine Why:  WITH bmp.  Contact information: [address] [place] [address] [phone]          Diet recommendation: cardiac diet  Activity: The patient is advised to gradually reintroduce usual activities.  Discharge Condition: good  Code Status: full code  History of present illness: As per the H and P dictated on admission, "Rachel Conner is a 81 y.o. 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 Burr Medico of oncology ; who presents after being found sitting in her yard picking grass altered. Patient was last noted to talk to her daughter about 2 days ago, and was noted to be fairly normal. In talks with the patient's daughter over the phone, she notes that the patient had left a message on her voicemail around 75 AM this morning for which the patient's speech was slurred speech and was hard to understand what she was talking about. The daughter lives out of town and states that she called her multiple times without an answer and therefore called the sheriff's department to do a wellness check.  The patient's new neighbors had come home sometime around 5 PM and found the patient in the yard picking grass and appearing to be confused and therefore called EMS. Patient was reportedly found in her yard picking grass and talking out of her head. At baseline the patient lives alone and her daughter normally talks to her on a daily basis. Daughter notes that she may have had some slurred speech after 4 days prior that she recalls. Associated symptoms include recent complaints of generalized malaise, cough, intermittent diarrhea, nausea, and vomiting. Daughter makes note that patient has had some issues with diarrhea, nausea, vomiting since she was diagnosed with carcinoid adenocarcinoma, but is followed by Dr. Burr Medico and noted to have normal workup last checked in 11/2016."  Hospital Course:  Summary of her active problems in the hospital is as following. 1. Acute metabolic encephalopathy. 2 days prior to admission mentation was normal, but presented with confusion, slurred speech. CT head no acute intracranial abnormality MRI brain MRA head no acute findings, chronic small vessel ischemia. Blood cultures no growth until now  UDS negative  TSH and free T4 normal, B-12 normal, ammonia normal,  UA shows no evidence of pyuria. Pro-calcitonin normal as well. Thus infectious diseases workup so far is negative. Metabolic work up also has been unremarkable.  At present likely etiology is psychotropic medicine vs depression.  Will hold lexapro and monitor for next week.  Follow up with PCP for alternate regimen for depression. May  be wellbutrin vs atypical antipsychotics.   patient presented with severe hypokalemia unsure whether that is contributing to patient's presentation but patient is getting better with correction. PTOT consults recommends SNF Avoiding any psychotropic medication including the lorazepam, Lexapro  2. Severe hypokalemia. Etiology unclear, no evidence of GI losses. Patient is  taking Nexium at home and Protonix here Changing to Pepcid. Continue KDUR 10 meq.  Recheck BMP  3. Hypothyroidism. Continuing Synthroid.  4. Essential hypertension. Blood pressure relatively stable, continue Norvasc.  5. History of DVT. On Xarelto at home, continuing home dose.  6.Dyslipidemia On 80 mg Lipitor since admission. LDL fasting 59, cholesterol 123. Last workup as an outpatient LDL 61, cholesterol 144. I would reduce patient's Lipitor from 80 mg 40 mg daily.   All other chronic medical condition were stable during the hospitalization.  Patient was seen by physical therapy, who recommended SNF, which was arranged by Education officer, museum and case Freight forwarder. On the day of the discharge the patient's vitals were stable, and no other acute medical condition were reported by patient. the patient was felt safe to be discharge at SNF with therapy.  Procedures and Results:  EEG   Consultations:  none  DISCHARGE MEDICATION: Current Discharge Medication List    START taking these medications   Details  famotidine (PEPCID) 20 MG tablet Take 1 tablet (20 mg total) by mouth daily. Qty: 30 tablet, Refills: 0    feeding supplement, ENSURE ENLIVE, (ENSURE ENLIVE) LIQD Take 237 mLs by mouth 2 (two) times daily between meals. Qty: 237 mL, Refills: 12    potassium chloride (K-DUR) 10 MEQ tablet Take 1 tablet (10 mEq total) by mouth daily. Qty: 10 tablet, Refills: 0      CONTINUE these medications which have CHANGED   Details  atorvastatin (LIPITOR) 40 MG tablet Take 1 tablet (40 mg total) by mouth daily at 6 PM. Qty: 30 tablet, Refills: 0      CONTINUE these medications which have NOT CHANGED   Details  amLODipine (NORVASC) 10 MG tablet TAKE 1 TABLET DAILY Qty: 90 tablet, Refills: 2    levothyroxine (SYNTHROID, LEVOTHROID) 100 MCG tablet Take 1 tablet (100 mcg total) by mouth daily. Qty: 90 tablet, Refills: 3   Associated Diagnoses: Other specified hypothyroidism      XARELTO 15 MG TABS tablet TAKE 1 TABLET DAILY WITH SUPPER Qty: 90 tablet, Refills: 1    aspirin EC 81 MG tablet Take 81 mg by mouth 3 (three) times a week.    Associated Diagnoses: Anemia, unspecified      STOP taking these medications     escitalopram (LEXAPRO) 20 MG tablet      esomeprazole (NEXIUM) 40 MG capsule      Loratadine 10 MG CAPS      Acetaminophen-Caffeine (TENSION HEADACHE PO)      Melatonin 5 MG TABS        Allergies  Allergen Reactions  . Niacin Other (See Comments)    Severe flushing  . Nitrogen Other (See Comments)    Hypotension  . Nitroglycerin Other (See Comments)    Hypotension  . Myrbetriq [Mirabegron] Other (See Comments)    Gross hematuria  . Niaspan [Niacin Er] Other (See Comments)    Severe flushing   Discharge Instructions    Diet general    Complete by:  As directed    Discharge instructions    Complete by:  As directed    It is important that you read following instructions as well as  go over your medication list with RN to help you understand your care after this hospitalization.  Discharge Instructions: Please follow-up with PCP in one week  Please request your primary care physician to go over all Hospital Tests and Procedure/Radiological results at the follow up,  Please get all Hospital records sent to your PCP by signing hospital release before you go home.   Do not take more than prescribed Pain, Sleep and Anxiety Medications. You were cared for by a hospitalist during your hospital stay. If you have any questions about your discharge medications or the care you received while you were in the hospital after you are discharged, you can call the unit and ask to speak with the hospitalist on call if the hospitalist that took care of you is not available.  Once you are discharged, your primary care physician will handle any further medical issues. Please note that NO REFILLS for any discharge medications will be authorized once you  are discharged, as it is imperative that you return to your primary care physician (or establish a relationship with a primary care physician if you do not have one) for your aftercare needs so that they can reassess your need for medications and monitor your lab values. You Must read complete instructions/literature along with all the possible adverse reactions/side effects for all the Medicines you take and that have been prescribed to you. Take any new Medicines after you have completely understood and accept all the possible adverse reactions/side effects. Wear Seat belts while driving. If you have smoked or chewed Tobacco in the last 2 yrs please stop smoking and/or stop any Recreational drug use.   Increase activity slowly    Complete by:  As directed      Discharge Exam: Filed Weights   04/18/17 1907  Weight: 81.6 kg (180 lb)   Vitals:   04/22/17 0847 04/22/17 1401  BP: (!) 155/55 (!) 142/65  Pulse: 74 84  Resp: 18 16  Temp: 98 F (36.7 C) 98.4 F (36.9 C)  SpO2: 93% 95%   General: Appear in mild distress, no Rash; Oral Mucosa moist. Cardiovascular: S1 and S2 Present, no Murmur, no JVD Respiratory: Bilateral Air entry present and Clear to Auscultation, no Crackles, no wheezes Abdomen: Bowel Sound present, Soft and no tenderness Extremities: no Pedal edema, no calf tenderness Neurology: Grossly no focal neuro deficit.  The results of significant diagnostics from this hospitalization (including imaging, microbiology, ancillary and laboratory) are listed below for reference.    Significant Diagnostic Studies: Dg Chest 2 View  Result Date: 04/18/2017 CLINICAL DATA:  Leukocytosis.  Congestion. EXAM: CHEST  2 VIEW COMPARISON:  12/11/2015 FINDINGS: The lungs are clear. The pulmonary vasculature is normal. Heart size is normal. Hilar and mediastinal contours are unremarkable. There is no pleural effusion. IMPRESSION: No active cardiopulmonary disease. Electronically Signed   By:  Andreas Newport M.D.   On: 04/18/2017 21:26   Ct Head Wo Contrast  Result Date: 04/18/2017 CLINICAL DATA:  Confusion. EXAM: CT HEAD WITHOUT CONTRAST TECHNIQUE: Contiguous axial images were obtained from the base of the skull through the vertex without intravenous contrast. COMPARISON:  07/22/2016 FINDINGS: Brain: There is no evidence for acute hemorrhage, hydrocephalus, mass lesion, or abnormal extra-axial fluid collection. No definite CT evidence for acute infarction. Diffuse loss of parenchymal volume is consistent with atrophy. Patchy low attenuation in the deep hemispheric and periventricular white matter is nonspecific, but likely reflects chronic microvascular ischemic demyelination. Stable appearance lacunar infarcts in the bilateral  splenic nuclei. Vascular: No hyperdense vessel or unexpected calcification. Skull: No evidence for fracture. No worrisome lytic or sclerotic lesion. Sinuses/Orbits: The visualized paranasal sinuses and mastoid air cells are clear. Visualized portions of the globes and intraorbital fat are unremarkable. Other: None. IMPRESSION: 1. Stable.  No acute intracranial abnormality. 2. Atrophy with chronic small vessel white matter ischemic disease. Electronically Signed   By: Misty Stanley M.D.   On: 04/18/2017 19:58   Mr Brain Wo Contrast  Result Date: 04/19/2017 CLINICAL DATA:  Encephalopathy EXAM: MRI HEAD WITHOUT CONTRAST MRA HEAD WITHOUT CONTRAST TECHNIQUE: Multiplanar, multiecho pulse sequences of the brain and surrounding structures were obtained without intravenous contrast. Angiographic images of the head were obtained using MRA technique without contrast. COMPARISON:  Head CT from yesterday.  Brain MRI 03/20/2014 FINDINGS: MRI HEAD FINDINGS Brain: No acute infarction, hemorrhage, obstructive hydrocephalus, extra-axial collection or mass lesion. There is atrophy with ventriculomegaly. On sagittal imaging the corpus callosum appears up lifted but callosal angle is not  narrowed and there is no definite asymmetric sulcal narrowing to strongly suggest communicating hydrocephalus. There is asymmetric right temporal horn dilatation that has been seen since 2015 and is nonspecific. Chronic small vessel ischemia with confluent gliosis around the lateral ventricles. Chronic ischemic change in the bilateral thalamus. Ischemic changes have progressed since 2015. Vascular: Arterial findings below. Normal dural venous sinus flow voids. Skull and upper cervical spine: No evidence of marrow lesion Sinuses/Orbits: No acute finding Other: Intermittent significant motion degradation which could easily obscure pathology. MRA HEAD FINDINGS Motion degraded study, best obtainable. The major vessels are widely patent. Aplastic right A1 segment with large right posterior communicating artery and hypoplastic right P1 segment. No branch occlusion. No indication of aneurysm. IMPRESSION: Brain MRI: 1. No acute finding. 2. Chronic small vessel ischemia and atrophy that has progressed since 2015 comparison. 3. Motion degraded study, intermittently severe. Intracranial MRA: No acute finding or flow limiting proximal stenosis. Electronically Signed   By: Monte Fantasia M.D.   On: 04/19/2017 18:59   Mr Jodene Nam Head Wo Contrast  Result Date: 04/19/2017 CLINICAL DATA:  Encephalopathy EXAM: MRI HEAD WITHOUT CONTRAST MRA HEAD WITHOUT CONTRAST TECHNIQUE: Multiplanar, multiecho pulse sequences of the brain and surrounding structures were obtained without intravenous contrast. Angiographic images of the head were obtained using MRA technique without contrast. COMPARISON:  Head CT from yesterday.  Brain MRI 03/20/2014 FINDINGS: MRI HEAD FINDINGS Brain: No acute infarction, hemorrhage, obstructive hydrocephalus, extra-axial collection or mass lesion. There is atrophy with ventriculomegaly. On sagittal imaging the corpus callosum appears up lifted but callosal angle is not narrowed and there is no definite asymmetric  sulcal narrowing to strongly suggest communicating hydrocephalus. There is asymmetric right temporal horn dilatation that has been seen since 2015 and is nonspecific. Chronic small vessel ischemia with confluent gliosis around the lateral ventricles. Chronic ischemic change in the bilateral thalamus. Ischemic changes have progressed since 2015. Vascular: Arterial findings below. Normal dural venous sinus flow voids. Skull and upper cervical spine: No evidence of marrow lesion Sinuses/Orbits: No acute finding Other: Intermittent significant motion degradation which could easily obscure pathology. MRA HEAD FINDINGS Motion degraded study, best obtainable. The major vessels are widely patent. Aplastic right A1 segment with large right posterior communicating artery and hypoplastic right P1 segment. No branch occlusion. No indication of aneurysm. IMPRESSION: Brain MRI: 1. No acute finding. 2. Chronic small vessel ischemia and atrophy that has progressed since 2015 comparison. 3. Motion degraded study, intermittently severe. Intracranial MRA: No acute finding or  flow limiting proximal stenosis. Electronically Signed   By: Monte Fantasia M.D.   On: 04/19/2017 18:59    Microbiology: Recent Results (from the past 240 hour(s))  Blood culture (routine x 2)     Status: None (Preliminary result)   Collection Time: 04/18/17  9:30 PM  Result Value Ref Range Status   Specimen Description BLOOD RIGHT ANTECUBITAL  Final   Special Requests   Final    BOTTLES DRAWN AEROBIC AND ANAEROBIC Blood Culture adequate volume   Culture NO GROWTH 4 DAYS  Final   Report Status PENDING  Incomplete  Blood culture (routine x 2)     Status: None (Preliminary result)   Collection Time: 04/18/17  9:36 PM  Result Value Ref Range Status   Specimen Description BLOOD LEFT HAND  Final   Special Requests   Final    BOTTLES DRAWN AEROBIC AND ANAEROBIC Blood Culture adequate volume   Culture NO GROWTH 4 DAYS  Final   Report Status PENDING   Incomplete     Labs: CBC:  Recent Labs Lab 04/18/17 1915 04/20/17 0811 04/21/17 0735 04/22/17 0515  WBC 14.0* 15.4* 11.1* 9.9  NEUTROABS 11.8* 11.4* 6.7  --   HGB 12.5 14.2 14.1 13.3  HCT 38.8 43.2 42.1 41.9  MCV 89.2 88.2 87.3 89.5  PLT 292 316 308 201   Basic Metabolic Panel:  Recent Labs Lab 04/18/17 1915 04/19/17 0141 04/20/17 0811 04/20/17 1512 04/21/17 0735 04/22/17 0515  NA 142  --  140 139 139 141  K 2.9*  --  2.5* 2.6* 3.1* 3.6  CL 107  --  99* 103 102 107  CO2 23  --  28 26 25 27   GLUCOSE 119*  --  110* 142* 101* 108*  BUN 20  --  10 11 12 19   CREATININE 1.04*  --  0.64 0.69 0.63 0.70  CALCIUM 8.6*  --  9.1 9.0 9.4 9.3  MG  --  1.8 1.8  --  1.7 1.8   Liver Function Tests:  Recent Labs Lab 04/18/17 1915 04/21/17 0735  AST 24 39  ALT 14 27  ALKPHOS 34* 45  BILITOT 1.7* 1.1  PROT 5.9* 6.7  ALBUMIN 3.5 3.8   No results for input(s): LIPASE, AMYLASE in the last 168 hours.  Recent Labs Lab 04/18/17 1915  AMMONIA 14   Time spent:  minutes  Signed:  Kobe Ofallon  [facility]  04/22/2017  , 3:45 PM

## 2017-04-22 NOTE — Care Management Important Message (Signed)
Important Message  Patient Details  Name: Rachel Conner MRN: [id number] Date of Birth: 1936-04-14   Medicare Important Message Given:  Yes    Nathen May 04/22/2017, 11:25 AM

## 2017-04-22 NOTE — Progress Notes (Signed)
Physical Therapy Treatment Patient Details Name: Rachel Conner MRN: [id number] DOB: Oct 07, 1935 Today's Date: 04/22/2017    History of Present Illness Pt is a 81 y/o female admitted secondary to AMS. MRI, MRA, and CT negative for acute abnormalities. EEG abnormal secondary to mild to moderate diffuse slowing of waking background. PMH includes DVT, CHF, HTN, CAD s/p CABG, anxiety, depression, arthrits, colon cancer, MI, peripheral neuropathy, and back surgery.     PT Comments    Pt with improved activity tolerance and participation with PT this session. Pt able to follow simple commands and demo'd improved coordination with movements with cues. Pt is progressing toward goals. Acute PT to continue during pt's acute stay.    Follow Up Recommendations  SNF;Supervision/Assistance - 24 hour     Equipment Recommendations  Wheelchair (measurements PT);Wheelchair cushion (measurements PT)    Precautions / Restrictions Precautions Precautions: Fall Restrictions Weight Bearing Restrictions: No    Mobility  Bed Mobility Overal bed mobility: Needs Assistance Bed Mobility: Rolling;Sidelying to Sit Rolling: Min assist Sidelying to sit: Min assist       General bed mobility comments: with HOB elevated ~30 degrees and rail used. Pt able to assist with bending knees, grasp rail with cues/assistance, bringing legs of edge and use of UE's to elevate into sitting. step by step cues needed for sequencing and technique.   Transfers Overall transfer level: Needs assistance   Transfers: Sit to/from Stand;Stand Pivot Transfers Sit to Stand: Min assist Stand pivot transfers: Min assist       General transfer comment: sit<>stand performed x2 from bed with stand<>pivot transfer performed on 2cd stand. worked on standing posture (hip/knee/trunk extension) with both stands. side stepping performed on 1st stand with multimodal cues needed, pt taking 4 side steps. with tranfer to chair pt needed cues  "step toward chair" and she was able to do so with min assist for 5 pivot steps.                                    Balance Overall balance assessment: Needs assistance Sitting-balance support: Feet supported Sitting balance-Leahy Scale: Fair     Standing balance support: During functional activity;Bilateral upper extremity supported Standing balance-Leahy Scale: Poor Standing balance comment: Reliant on UE support and external support for balance.            Cognition Arousal/Alertness: Awake/alert Behavior During Therapy: Flat affect Overall Cognitive Status: No family/caregiver present to determine baseline cognitive functioning Area of Impairment: Memory;Safety/judgement;Awareness;Problem solving;Orientation       Orientation Level: Disoriented to;Place;Time;Situation Current Attention Level: Focused Memory: Decreased recall of precautions;Decreased short-term memory Following Commands: Follows one step commands consistently;Follows one step commands with increased time Safety/Judgement: Decreased awareness of safety;Decreased awareness of deficits Awareness: Intellectual Problem Solving: Slow processing;Decreased initiation;Difficulty sequencing;Requires verbal cues;Requires tactile cues General Comments: Pt able to follow simple commands today with slight incr in time to process. Unable to follow multistep commands.        Pertinent Vitals/Pain Pain Assessment: 0-10 Pain Location: neck- pt unable to rate/state faces due to decr cognition. repeated "my neck" when asked about pain. RN notified Pain Intervention(s): Monitored during session;Patient requesting pain meds-RN notified;Repositioned     PT Goals (current goals can now be found in the care plan section) Acute Rehab PT Goals Patient Stated Goal: none stated  PT Goal Formulation: Patient unable to participate in goal setting Time For Goal Achievement: 05/05/17  Potential to Achieve Goals: Fair Progress  towards PT goals: Progressing toward goals    Frequency    Min 2X/week      PT Plan Current plan remains appropriate       AM-PAC PT "6 Clicks" Daily Activity  Outcome Measure  Difficulty turning over in bed (including adjusting bedclothes, sheets and blankets)?: A Little Difficulty moving from lying on back to sitting on the side of the bed? : A Lot Difficulty sitting down on and standing up from a chair with arms (e.g., wheelchair, bedside commode, etc,.)?: A Little Help needed moving to and from a bed to chair (including a wheelchair)?: A Little Help needed walking in hospital room?: Total Help needed climbing 3-5 steps with a railing? : Total 6 Click Score: 13    End of Session Equipment Utilized During Treatment: Gait belt Activity Tolerance: Patient tolerated treatment well Patient left: in chair;with chair alarm set;with nursing/sitter in room;with call bell/phone within reach (RN in room; tele sitter in room) Nurse Communication: Mobility status PT Visit Diagnosis: Unsteadiness on feet (R26.81);Other abnormalities of gait and mobility (R26.89);Muscle weakness (generalized) (M62.81)     Time: 2637-8588 PT Time Calculation (min) (ACUTE ONLY): 33 min  Charges:  $Therapeutic Activity: 23-37 mins           Willow Ora, PTA, CLT Acute Rehab Services Office- [phone] 04/22/17, 1:09 PM  Willow Ora 04/22/2017, 1:09 PM

## 2017-04-22 NOTE — Progress Notes (Deleted)
CARDIOLOGY OFFICE NOTE  Date:  04/22/2017    Rachel Conner Date of Birth: [birth year] Medical Record #[id number]  PCP:  Ma Hillock, DO  Cardiologist:  Johnsie Cancel    No chief complaint on file.   History of Present Illness: Rachel Conner is a 81 y.o. female who presents today for a follow up visit.     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    She has a history of CAD with bypass surgery in 1999. She subsequently had stenting of the native RCA and has an occluded vein graft to the right. She also has a stent in the native obtuse marginal branch. Her last heart catheterization was in 2003. She was diagnosed with an extensive lower extremity DVT in 2014. She was treated with Coumadin for some time but then switched to Xarelto 15 mg daily. She's had issues with memory and seen at [facility] where she was diagnosed with possible vascular dementia. Her heart rate was noted to be low and Lopressor was stopped.  She complained of chest pain and had a preop Myoview done in 09/2014 which showed no ischemia or infarct EF 66%.   In 03/2015 she presented with abdominal pain, intermittent nausea and vomiting, diarrhea. She was seen at [facility] emergency room in September, and CT of abdomen and pelvis revealed dilated appendix. She was referred to general surgeon Dr. Belenda Cruise, who brought her to OR on 04/11/2015 for appendectomy. She tolerated surgery well. The surgical path reviewed mixed carcinoid and adenocarcinoma, with area of small perforation. She was brought back to the OR on 04/28/2015, and underwent right hemicolectomy. She is followed by oncology - noted that her risk of recurrence is high - she has declined adjuvant chemotherapy.   Seen back in September by Bonney Leitz, PA for evaluation of dizziness when standing. She had lost  significant amounts of weight due to depression (her daughter is a drug addict). She had lost 40 lbs over the last 6 months. She was orthostatic. Norvasc was cut back.   ***  Past Medical History:  Diagnosis Date  . Anemia   . Anxiety   . Arthritis    Osteoarthrosis  . CAD (coronary artery disease) 1999   CABG  . Cancer [facility])    cancer of the appendix   . Cataract    rt eye per pt  . CHF (congestive heart failure) ([place])   . Colon cancer ([place]) 2017  . Colon polyps   . Confusion, postoperative   . DVT (deep venous thrombosis) ([place])   . GERD (gastroesophageal reflux disease)   . Hemorrhoids   . Hiatal hernia   . HLD (hyperlipidemia)   . HTN (hypertension)   . Hypothyroidism   . Major depressive disorder   . MI (myocardial infarction) ([place])   . Peripheral neuropathy   . Urinary incontinence     Past Surgical History:  Procedure Laterality Date  . CAROTID STENT  07/1998   stent RCA   . CHOLECYSTECTOMY  2011  . COLON SURGERY  03/2015   partial colon to remove cancer in appendix  . COLONOSCOPY W/ BIOPSIES    . CORONARY ARTERY BYPASS GRAFT  1999   6 vessel per pt.  . CORONARY STENT PLACEMENT  10/2001   circumflex stent  . ESOPHAGOGASTRODUODENOSCOPY    . LAPAROSCOPIC  APPENDECTOMY N/A 04/11/2015   Procedure: LAPAROSCOPIC APPENDECTOMY;  Surgeon: Coralie Keens, MD;  Location: [place];  Service: General;  Laterality: N/A;  Laparoscopic converted to open appendectomy   . LUMBAR LAMINECTOMY     fusion 1971  . PARTIAL COLECTOMY N/A 04/28/2015   Procedure: Ileocecectomy;  Surgeon: Coralie Keens, MD;  Location: [place];  Service: General;  Laterality: N/A;  . Right Thumb Right    Thumb Finger Release  . THYROID SURGERY       Medications: No current facility-administered medications for this visit.    Current Outpatient Prescriptions  Medication Sig Dispense Refill  . atorvastatin (LIPITOR) 40 MG tablet Take 1 tablet (40 mg total) by mouth daily at 6 PM. 30 tablet 0  .  famotidine (PEPCID) 20 MG tablet Take 1 tablet (20 mg total) by mouth daily. 30 tablet 0  . feeding supplement, ENSURE ENLIVE, (ENSURE ENLIVE) LIQD Take 237 mLs by mouth 2 (two) times daily between meals. 237 mL 12   Facility-Administered Medications Ordered in Other Visits  Medication Dose Route Frequency Provider Last Rate Last Dose  . acetaminophen (TYLENOL) tablet 650 mg  650 mg Oral Q4H PRN Fuller Plan A, MD   650 mg at 04/22/17 1152   Or  . acetaminophen (TYLENOL) solution 650 mg  650 mg Per Tube Q4H PRN Fuller Plan A, MD       Or  . acetaminophen (TYLENOL) suppository 650 mg  650 mg Rectal Q4H PRN Tamala Julian, Rondell A, MD      . amLODipine (NORVASC) tablet 10 mg  10 mg Oral Daily Fuller Plan A, MD   10 mg at 04/22/17 0951  . atorvastatin (LIPITOR) tablet 40 mg  40 mg Oral q1800 Lavina Hamman, MD   40 mg at 04/21/17 1732  . famotidine (PEPCID) tablet 20 mg  20 mg Oral Daily Lavina Hamman, MD   20 mg at 04/22/17 0951  . feeding supplement (ENSURE ENLIVE) (ENSURE ENLIVE) liquid 237 mL  237 mL Oral BID BM Patrecia Pour, Christean Grief, MD   237 mL at 04/22/17 0955  . levothyroxine (SYNTHROID, LEVOTHROID) tablet 100 mcg  100 mcg Oral QAC breakfast Fuller Plan A, MD   100 mcg at 04/22/17 0955  . Rivaroxaban (XARELTO) tablet 15 mg  15 mg Oral Q supper Fuller Plan A, MD   15 mg at 04/21/17 1732  . senna-docusate (Senokot-S) tablet 1 tablet  1 tablet Oral QHS PRN Norval Morton, MD        Allergies: Allergies  Allergen Reactions  . Niacin Other (See Comments)    Severe flushing  . Nitrogen Other (See Comments)    Hypotension  . Nitroglycerin Other (See Comments)    Hypotension  . Myrbetriq [Mirabegron] Other (See Comments)    Gross hematuria  . Niaspan [Niacin Er] Other (See Comments)    Severe flushing    Social History: The patient  reports that she quit smoking about 19 years ago. She has a 45.00 pack-year smoking history. She has never used smokeless tobacco. She reports  that she does not drink alcohol or use drugs.   Family History: The patient's family history includes Cancer in her sister; Heart disease in her brother, father, mother, and other.   Review of Systems: Please see the history of present illness.   Otherwise, the review of systems is positive for none.   All other systems are reviewed and negative.   Physical Exam: VS:  There were no vitals taken for  this visit. Marland Kitchen  BMI There is no height or weight on file to calculate BMI.  Wt Readings from Last 3 Encounters:  04/18/17 180 lb (81.6 kg)  02/02/17 181 lb 8 oz (82.3 kg)  11/11/16 191 lb 1.6 oz (86.7 kg)    Affect appropriate Healthy:  appears stated age HEENT: normal Neck supple with no adenopathy JVP normal no bruits no thyromegaly Lungs clear with no wheezing and good diaphragmatic motion Heart:  S1/S2 no murmur, no rub, gallop or click PMI normal Abdomen: benighn, BS positve, no tenderness, no AAA no bruit.  No HSM or HJR Distal pulses intact with no bruits No edema Neuro non-focal dementia with poor memory  Skin warm and dry No muscular weakness    LABORATORY DATA:  EKG:  EKG is not ordered today.  Lab Results  Component Value Date   WBC 9.9 04/22/2017   HGB 13.3 04/22/2017   HCT 41.9 04/22/2017   PLT 278 04/22/2017   GLUCOSE 108 (H) 04/22/2017   CHOL 123 04/19/2017   TRIG 71 04/19/2017   HDL 50 04/19/2017   LDLCALC 59 04/19/2017   ALT 27 04/21/2017   AST 39 04/21/2017   NA 141 04/22/2017   K 3.6 04/22/2017   CL 107 04/22/2017   CREATININE 0.70 04/22/2017   BUN 19 04/22/2017   CO2 27 04/22/2017   TSH 0.546 04/19/2017   INR 1.02 04/28/2015   HGBA1C 5.6 04/19/2017    BNP (last 3 results) No results for input(s): BNP in the last 8760 hours.  ProBNP (last 3 results) No results for input(s): PROBNP in the last 8760 hours.   Assessment/Plan:  Dizziness with standing: this has resolved with cutting back Norvasc.    HLD: continue statin. Lipids from  September noted.   CAD s/p CABG 1999  and PCI: low risk myoview in 09/2014. No chest pain reported.   Appendiceal cancer s/p appendectomy and hemicolectomy (04/2015): followed closely by Dr. Burr Medico  Hx of DVT: continue on Xarelto lower dose due to fall risk  - recent CBC ok. No bleeding noted.      Disposition:   FU with me 6 months    Jenkins Rouge

## 2017-04-22 NOTE — Progress Notes (Signed)
Patient will DC to: [facility]  Anticipated DC date: 04/22/17 Family notified: Daughter Transport by: Bess Kinds   Per MD patient ready for DC to [facility]. RN, patient, patient's family, and facility notified of DC. Discharge Summary sent to facility. RN given number for report [phone]). DC packet on chart. Ambulance transport requested for patient.   CSW signing off.  Cedric Fishman, [place] Social Worker [phone]

## 2017-04-23 LAB — CULTURE, BLOOD (ROUTINE X 2)
CULTURE: NO GROWTH
CULTURE: NO GROWTH
Special Requests: ADEQUATE
Special Requests: ADEQUATE

## 2017-04-26 ENCOUNTER — Ambulatory Visit: Payer: Medicare Other | Admitting: Cardiovascular Disease

## 2017-04-27 ENCOUNTER — Encounter: Payer: Self-pay | Admitting: Cardiovascular Disease

## 2017-05-05 ENCOUNTER — Emergency Department (HOSPITAL_COMMUNITY)
Admission: EM | Admit: 2017-05-05 | Discharge: 2017-05-06 | Disposition: A | Discharge status: Home / Self Care | Payer: Medicare Other | Attending: Emergency Medicine | Admitting: Emergency Medicine

## 2017-05-05 ENCOUNTER — Encounter (HOSPITAL_COMMUNITY): Payer: Self-pay

## 2017-05-05 DIAGNOSIS — Z951 Presence of aortocoronary bypass graft: Secondary | ICD-10-CM | POA: Diagnosis not present

## 2017-05-05 DIAGNOSIS — I252 Old myocardial infarction: Secondary | ICD-10-CM | POA: Diagnosis not present

## 2017-05-05 DIAGNOSIS — I509 Heart failure, unspecified: Secondary | ICD-10-CM | POA: Insufficient documentation

## 2017-05-05 DIAGNOSIS — Z7982 Long term (current) use of aspirin: Secondary | ICD-10-CM | POA: Diagnosis not present

## 2017-05-05 DIAGNOSIS — Z7901 Long term (current) use of anticoagulants: Secondary | ICD-10-CM | POA: Diagnosis not present

## 2017-05-05 DIAGNOSIS — Z85038 Personal history of other malignant neoplasm of large intestine: Secondary | ICD-10-CM | POA: Insufficient documentation

## 2017-05-05 DIAGNOSIS — I2510 Atherosclerotic heart disease of native coronary artery without angina pectoris: Secondary | ICD-10-CM | POA: Diagnosis not present

## 2017-05-05 DIAGNOSIS — I110 Hypertensive heart disease with heart failure: Secondary | ICD-10-CM | POA: Insufficient documentation

## 2017-05-05 DIAGNOSIS — E039 Hypothyroidism, unspecified: Secondary | ICD-10-CM | POA: Diagnosis not present

## 2017-05-05 DIAGNOSIS — Z87891 Personal history of nicotine dependence: Secondary | ICD-10-CM | POA: Insufficient documentation

## 2017-05-05 DIAGNOSIS — R55 Syncope and collapse: Secondary | ICD-10-CM | POA: Insufficient documentation

## 2017-05-05 DIAGNOSIS — Z79899 Other long term (current) drug therapy: Secondary | ICD-10-CM | POA: Insufficient documentation

## 2017-05-05 LAB — CBC
HCT: 37.6 % (ref 36.0–46.0)
Hemoglobin: 11.9 g/dL — ABNORMAL LOW (ref 12.0–15.0)
MCH: 28.7 pg (ref 26.0–34.0)
MCHC: 31.6 g/dL (ref 30.0–36.0)
MCV: 90.6 fL (ref 78.0–100.0)
PLATELETS: 248 10*3/uL (ref 150–400)
RBC: 4.15 MIL/uL (ref 3.87–5.11)
RDW: 15.5 % (ref 11.5–15.5)
WBC: 7.5 10*3/uL (ref 4.0–10.5)

## 2017-05-05 LAB — BASIC METABOLIC PANEL
Anion gap: 8 (ref 5–15)
BUN: 11 mg/dL (ref 6–20)
CO2: 30 mmol/L (ref 22–32)
CREATININE: 0.97 mg/dL (ref 0.44–1.00)
Calcium: 9.2 mg/dL (ref 8.9–10.3)
Chloride: 101 mmol/L (ref 101–111)
GFR calc Af Amer: >60 mL/min (ref >60)
GFR calc non Af Amer: 54 mL/min — ABNORMAL LOW (ref >60)
GLUCOSE: 127 mg/dL — AB (ref 65–99)
Potassium: 3.4 mmol/L — ABNORMAL LOW (ref 3.5–5.1)
Sodium: 139 mmol/L (ref 135–145)

## 2017-05-05 NOTE — ED Notes (Signed)
Walked PT around POD D, PT stated that she feels light headed when walking. Got PT back to room. Vitals taken.Marland KitchenMarland Kitchen

## 2017-05-05 NOTE — ED Provider Notes (Signed)
[place] EMERGENCY DEPARTMENT Provider Note   CSN: [id number] Arrival date & time: 05/05/17  1929     History   Chief Complaint Chief Complaint  Patient presents with  . Loss of Consciousness    HPI Rachel Conner is a 81 y.o. female.  HPI  The pt was in her usual state of health when she tried to stand up from the dinner table after eating - she does not remember anything after that - reportedly had a syncopal episode in front of her son who was at dinner with her.  The patient was reported to be hypotensive on the paramedics initial evaluation without complaint.  Mental status was normal.  She has not had any complaints between then and now and states that she feels fine.  There has been no diarrhea, no dysuria, no headache, no chest pain coughing or shortness of breath and she denies any swelling.  According to the medical record after electronic chart review it is evident that the patient was recently admitted to the hospital for an episode of confusion and altered mental status, it was determined to be an acute metabolic encephalopathy, she was hypokalemic but had no other sources, she was not septic, she was released into the skilled nursing facility and has done very well since that time.  Again the patient has no complaints at this time  Past Medical History:  Diagnosis Date  . Anemia   . Anxiety   . Arthritis    Osteoarthrosis  . CAD (coronary artery disease) 1999   CABG  . Cancer [facility])    cancer of the appendix   . Cataract    rt eye per pt  . CHF (congestive heart failure) ([place])   . Colon cancer ([place]) 2017  . Colon polyps   . Confusion, postoperative   . DVT (deep venous thrombosis) ([place])   . GERD (gastroesophageal reflux disease)   . Hemorrhoids   . Hiatal hernia   . HLD (hyperlipidemia)   . HTN (hypertension)   . Hypothyroidism   . Major depressive disorder   . MI (myocardial infarction) ([place])   . Peripheral neuropathy   . Urinary  incontinence     Patient Active Problem List   Diagnosis Date Noted  . Acute encephalopathy 04/19/2017  . Hypokalemia 04/19/2017  . Prolonged QT interval 04/19/2017  . Sepsis ([place]) 04/18/2017  . Chronic prescription benzodiazepine use 02/03/2017  . Essential hypertension 11/03/2016  . Chronic seasonal allergic rhinitis due to pollen 10/13/2016  . Gastroesophageal reflux disease without esophagitis 10/13/2016  . Depression with anxiety 10/13/2016  . Mixed carcinoid-adenocarcinoma of colon ([place]) 04/28/2015  . Vascular dementia 11/07/2013  . Long term current use of anticoagulant therapy 04/03/2013  . DVT (deep venous thrombosis) ([place]) 03/06/2013  . Hypothyroidism 05/20/2008  . HYPERCHOLESTEROLEMIA, MIXED 05/20/2008  . HYPERTENSIVE CARDIOVASCULAR DISEASE, BENIGN 05/20/2008  . CORONARY ATHEROSCLEROSIS, NATIVE VESSEL 05/20/2008  . ANEMIA 01/26/2008    Past Surgical History:  Procedure Laterality Date  . CAROTID STENT  07/1998   stent RCA   . CHOLECYSTECTOMY  2011  . COLON SURGERY  03/2015   partial colon to remove cancer in appendix  . COLONOSCOPY W/ BIOPSIES    . CORONARY ARTERY BYPASS GRAFT  1999   6 vessel per pt.  . CORONARY STENT PLACEMENT  10/2001   circumflex stent  . ESOPHAGOGASTRODUODENOSCOPY    . LAPAROSCOPIC APPENDECTOMY N/A 04/11/2015   Procedure: LAPAROSCOPIC APPENDECTOMY;  Surgeon: Coralie Keens, MD;  Location:  [facility] OR;  Service: General;  Laterality: N/A;  Laparoscopic converted to open appendectomy   . LUMBAR LAMINECTOMY     fusion 1971  . PARTIAL COLECTOMY N/A 04/28/2015   Procedure: Ileocecectomy;  Surgeon: Coralie Keens, MD;  Location: [place];  Service: General;  Laterality: N/A;  . Right Thumb Right    Thumb Finger Release  . THYROID SURGERY      OB History    Gravida Para Term Preterm AB Living   3 3       3    SAB TAB Ectopic Multiple Live Births                   Home Medications    Prior to Admission medications   Medication Sig Start  Date End Date Taking? Authorizing Provider  amLODipine (NORVASC) 10 MG tablet TAKE 1 TABLET DAILY Patient taking differently: Take 1 tablet (10 mg) by mouth once a day 11/23/16   Josue Hector, MD  aspirin EC 81 MG tablet Take 81 mg by mouth 3 (three) times a week.     [provider]  atorvastatin (LIPITOR) 40 MG tablet Take 1 tablet (40 mg total) by mouth daily at 6 PM. 04/22/17   Lavina Hamman, MD  famotidine (PEPCID) 20 MG tablet Take 1 tablet (20 mg total) by mouth daily. 04/22/17   Lavina Hamman, MD  feeding supplement, ENSURE ENLIVE, (ENSURE ENLIVE) LIQD Take 237 mLs by mouth 2 (two) times daily between meals. 04/22/17   Lavina Hamman, MD  levothyroxine (SYNTHROID, LEVOTHROID) 100 MCG tablet Take 1 tablet (100 mcg total) by mouth daily. 02/04/17   Kuneff, Renee A, DO  potassium chloride (K-DUR) 10 MEQ tablet Take 1 tablet (10 mEq total) by mouth daily. 04/22/17   Lavina Hamman, MD  XARELTO 15 MG TABS tablet TAKE 1 TABLET DAILY WITH SUPPER Patient taking differently: Take 1 tablet (15 mg) by mouth daily with supper 03/28/17   Josue Hector, MD    Family History Family History  Problem Relation Age of Onset  . Heart disease Father   . Heart disease Mother   . Heart disease Brother   . Cancer Sister        non hodgkins lymphoma  . Heart disease Other        grandfather (side unknown)  . Colon cancer Neg Hx     Social History Social History  Substance Use Topics  . Smoking status: Former Smoker    Packs/day: 1.00    Years: 45.00    Quit date: 07/12/1997  . Smokeless tobacco: Never Used     Comment: quit smoking age 81  . Alcohol use No     Allergies   Niacin; Nitrogen; Nitroglycerin; Myrbetriq [mirabegron]; and Niaspan [niacin er]   Review of Systems Review of Systems  All other systems reviewed and are negative.    Physical Exam Updated Vital Signs BP 110/81 (BP Location: Right Arm)   Pulse 80   Temp 98.7 F (37.1 C) (Oral)   Resp 14   SpO2  97%   Physical Exam  Constitutional: She appears well-developed and well-nourished. No distress.  HENT:  Head: Normocephalic and atraumatic.  Mouth/Throat: Oropharynx is clear and moist. No oropharyngeal exudate.  Eyes: Pupils are equal, round, and reactive to light. Conjunctivae and EOM are normal. Right eye exhibits no discharge. Left eye exhibits no discharge. No scleral icterus.  Neck: Normal range of motion. Neck supple. No JVD present. No  thyromegaly present.  Cardiovascular: Normal rate, regular rhythm, normal heart sounds and intact distal pulses.  Exam reveals no gallop and no friction rub.   No murmur heard. Pulmonary/Chest: Effort normal and breath sounds normal. No respiratory distress. She has no wheezes. She has no rales.  Abdominal: Soft. Bowel sounds are normal. She exhibits no distension and no mass. There is no tenderness.  Musculoskeletal: Normal range of motion. She exhibits no edema or tenderness.  Lymphadenopathy:    She has no cervical adenopathy.  Neurological: She is alert. Coordination normal.  Skin: Skin is warm and dry. No rash noted. No erythema.  Psychiatric: She has a normal mood and affect. Her behavior is normal.  Nursing note and vitals reviewed.    ED Treatments / Results  Labs (all labs ordered are listed, but only abnormal results are displayed) Labs Reviewed  BASIC METABOLIC PANEL - Abnormal; Notable for the following:       Result Value   Potassium 3.4 (*)    Glucose, Bld 127 (*)    GFR calc non Af Amer 54 (*)    All other components within normal limits  CBC - Abnormal; Notable for the following:    Hemoglobin 11.9 (*)    All other components within normal limits  URINALYSIS, ROUTINE W REFLEX MICROSCOPIC  CBG MONITORING, ED    EKG  EKG Interpretation  Date/Time:  Thursday May 05 2017 19:42:11 EDT Ventricular Rate:  82 PR Interval:    QRS Duration: 95 QT Interval:  386 QTC Calculation: 451 R Axis:   51 Text Interpretation:   Sinus rhythm Prolonged PR interval Anteroseptal infarct, old since last tracing no significant change Confirmed by Noemi Chapel [phone]) on 05/05/2017 7:54:48 PM       Radiology No results found.  Procedures Procedures (including critical care time)  Medications Ordered in ED Medications - No data to display   Initial Impression / Assessment and Plan / ED Course  I have reviewed the triage vital signs and the nursing notes.  Pertinent labs & imaging results that were available during my care of the patient were reviewed by me and considered in my medical decision making (see chart for details).     The patient is from [place], her speech is clear and appropriate with her accident, she has no focal neurologic deficits and is able to follow all my commands, normal cardiac exam pulmonary exam and abdominal exam.  Her EKG is essentially unchanged.  We will obtain some basic labs and short emergency department observation as expected.  The labs are unremarkable, The VS are normal - no hypotension Pt ambulated without difficulty Stable for d/c back to her facility  Final Clinical Impressions(s) / ED Diagnoses   Final diagnoses:  Syncope, unspecified syncope type    New Prescriptions New Prescriptions   No medications on file     Noemi Chapel, MD 05/05/17 2307

## 2017-05-05 NOTE — Discharge Instructions (Signed)
Your testing has not shown any specific abnormalities.   Please return to the ER for any severe or worsening symptoms. Return to the ER if you have any more passing out spells.

## 2017-05-05 NOTE — ED Notes (Signed)
[facility] called for Transport 

## 2017-05-05 NOTE — ED Triage Notes (Signed)
Pt comes from [facility] via [facility] for syncopal episode that happened while eating dinner. Pt was found to be orthostatic with BP originally 77/30. Ax O no c/o of pain, did not head head or fall.

## 2017-05-06 NOTE — ED Notes (Signed)
Attempted report to [facility]

## 2017-05-09 ENCOUNTER — Telehealth: Payer: Self-pay | Admitting: Cardiovascular Disease

## 2017-05-09 NOTE — Telephone Encounter (Signed)
Whitley from [employer] returned your call. Please call [phone].Marland Kitchen CAn leave a msg about the appt date an dime . She has no preference. Thanks

## 2017-05-09 NOTE — Telephone Encounter (Signed)
Patient c/o Palpitations:  High priority if patient c/o lightheadedness, shortness of breath, or chest pain  1) How long have you had palpitations/irregular HR/ Afib? Are you having the symptoms now?  05/06/2017  2) Are you currently experiencing lightheadedness, SOB or CP? Unknown, Dr.James McGee   3) Do you have a history of afib (atrial fibrillation) or irregular heart rhythm? unknown  4) Have you checked your BP or HR? (document readings if available): 111/64 76hr  5) Are you experiencing any other symptoms? No

## 2017-05-09 NOTE — Telephone Encounter (Signed)
Called Rachel Conner with [facility] with patient appt information. Patient has an appointment on 05/16/17 at 10:15 with Dr. Johnsie Cancel.

## 2017-05-09 NOTE — Telephone Encounter (Signed)
Left message for Witley at [facility] to give our office a call back.

## 2017-05-11 NOTE — Progress Notes (Signed)
CARDIOLOGY OFFICE NOTE  Date:  05/16/2017    Rachel Conner Date of Birth: 1935-08-03 Medical Record #[id number]  PCP:  Ma Hillock, DO  Cardiologist:  Johnsie Cancel    No chief complaint on file.   History of Present Illness: Rachel Conner is a 81 y.o. female who presents today for a follow up visit.     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    She has a history of CAD with bypass surgery in 1999. She subsequently had stenting of the native RCA and has an occluded vein graft to the right. She also has a stent in the native obtuse marginal branch. Her last heart catheterization was in 2003. She was diagnosed with an extensive lower extremity DVT in 2014. She was treated with Coumadin for some time but then switched to Xarelto 15 mg daily. She's had issues with memory and seen at [facility] where she was diagnosed with possible vascular dementia. Her heart rate was noted to be low and Lopressor was stopped.  Myoview done in 09/2014 which showed no ischemia or infarct EF 66%.   04/11/2015 for appendectomy.  04/28/2015, and underwent right hemicolectomy. She is followed by oncology - noted that her risk of recurrence is high - she has declined adjuvant chemotherapy.   Seen back in September by Rachel Leitz, PA for evaluation of dizziness when standing. She had lost significant amounts of weight due to depression (her daughter is a drug addict). She had lost 40 lbs over the last 6 months. She was orthostatic. Norvasc was cut back.   Now complaining of palpitations Seen in ER 05/05/17 with "syncope" Admissions for altered MS and confusion. No chest Pain palpitations or dyspnea BP noted to be a bit low by EMS R/O labs ok Telemetry normal no residual symptoms d/c home to SNF  Going to [place] in December Daughter had extensive lumbar  fusion and is in a brace  Past Medical History:  Diagnosis Date  . Anemia   . Anxiety   . Arthritis    Osteoarthrosis  . CAD (coronary artery disease) 1999   CABG  . Cancer [facility]    cancer of the appendix   . Cataract    rt eye per pt  . CHF (congestive heart failure) ([place])   . Colon cancer ([place]) 2017  . Colon polyps   . Confusion, postoperative   . DVT (deep venous thrombosis) ([place])   . GERD (gastroesophageal reflux disease)   . Hemorrhoids   . Hiatal hernia   . HLD (hyperlipidemia)   . HTN (hypertension)   . Hypothyroidism   . Major depressive disorder   . MI (myocardial infarction) ([place])   . Peripheral neuropathy   . Urinary incontinence     Past Surgical History:  Procedure Laterality Date  . CAROTID STENT  07/1998   stent RCA   . CHOLECYSTECTOMY  2011  . COLON SURGERY  03/2015   partial colon to remove cancer in appendix  . COLONOSCOPY W/ BIOPSIES    . CORONARY ARTERY BYPASS GRAFT  1999   6 vessel per pt.  . CORONARY STENT PLACEMENT  10/2001   circumflex stent  . ESOPHAGOGASTRODUODENOSCOPY    . LUMBAR LAMINECTOMY     fusion 1971  . Right Thumb Right    Thumb Finger Release  .  THYROID SURGERY       Medications: Current Outpatient Medications  Medication Sig Dispense Refill  . acetaminophen (TYLENOL) 325 MG tablet Take 650 mg every 6 (six) hours as needed by mouth.    Marland Kitchen aspirin EC 81 MG tablet Take 81 mg by mouth 3 (three) times a week.     Marland Kitchen atorvastatin (LIPITOR) 40 MG tablet Take 1 tablet (40 mg total) by mouth daily at 6 PM. 30 tablet 0  . famotidine (PEPCID) 20 MG tablet Take 1 tablet (20 mg total) by mouth daily. 30 tablet 0  . feeding supplement, ENSURE ENLIVE, (ENSURE ENLIVE) LIQD Take 237 mLs by mouth 2 (two) times daily between meals. 237 mL 12  . FLUoxetine (PROZAC) 10 MG tablet Take 10 mg daily by mouth.    . levothyroxine (SYNTHROID, LEVOTHROID) 100 MCG tablet Take 1 tablet (100 mcg total) by mouth daily. 90 tablet 3  . Melatonin 1 MG CAPS  Take 3 mg daily by mouth.    . ondansetron (ZOFRAN) 4 MG tablet Take 4 mg every 8 (eight) hours as needed by mouth for nausea or vomiting.    . polyethylene glycol (MIRALAX / GLYCOLAX) packet Take 17 g daily as needed by mouth.    . potassium chloride (K-DUR) 10 MEQ tablet Take 1 tablet (10 mEq total) by mouth daily. 10 tablet 0  . sennosides-docusate sodium (SENOKOT-S) 8.6-50 MG tablet Take 1 tablet daily by mouth.    . TRAZODONE HCL PO Take 50 mg as needed by mouth.    Alveda Reasons 15 MG TABS tablet TAKE 1 TABLET DAILY WITH SUPPER (Patient taking differently: Take 1 tablet (15 mg) by mouth daily with supper) 90 tablet 1   No current facility-administered medications for this visit.     Allergies: Allergies  Allergen Reactions  . Niacin Other (See Comments)    Severe flushing  . Nitrogen Other (See Comments)    Hypotension  . Nitroglycerin Other (See Comments)    Hypotension  . Myrbetriq [Mirabegron] Other (See Comments)    Gross hematuria  . Niaspan [Niacin Er] Other (See Comments)    Severe flushing    Social History: The patient  reports that she quit smoking about 19 years ago. She has a 45.00 pack-year smoking history. she has never used smokeless tobacco. She reports that she does not drink alcohol or use drugs.   Family History: The patient's family history includes Cancer in her sister; Heart disease in her brother, father, mother, and other.   Review of Systems: Please see the history of present illness.   Otherwise, the review of systems is positive for none.   All other systems are reviewed and negative.   Physical Exam: VS:  BP 112/62   Pulse 79   Ht 5\' 4"  (1.626 m)   Wt 170 lb 6.4 oz (77.3 kg)   BMI 29.25 kg/m  .  BMI Body mass index is 29.25 kg/m.  Wt Readings from Last 3 Encounters:  05/16/17 170 lb 6.4 oz (77.3 kg)  04/18/17 180 lb (81.6 kg)  02/02/17 181 lb 8 oz (82.3 kg)    Affect appropriate Healthy:  appears stated age 48: normal Neck supple  with no adenopathy JVP normal no bruits no thyromegaly Lungs clear with no wheezing and good diaphragmatic motion Heart:  S1/S2 no murmur, no rub, gallop or click PMI normal Abdomen: benighn, post appendectomy and colectomy  Distal pulses intact with no bruits No edema Neuro non-focal Skin warm and dry No  muscular weakness     LABORATORY DATA:  EKG:  EKG is not ordered today.  Lab Results  Component Value Date   WBC 7.5 05/05/2017   HGB 11.9 (L) 05/05/2017   HCT 37.6 05/05/2017   PLT 248 05/05/2017   GLUCOSE 127 (H) 05/05/2017   CHOL 123 04/19/2017   TRIG 71 04/19/2017   HDL 50 04/19/2017   LDLCALC 59 04/19/2017   ALT 27 04/21/2017   AST 39 04/21/2017   NA 139 05/05/2017   K 3.4 (L) 05/05/2017   CL 101 05/05/2017   CREATININE 0.97 05/05/2017   BUN 11 05/05/2017   CO2 30 05/05/2017   TSH 0.546 04/19/2017   INR 1.02 04/28/2015   HGBA1C 5.6 04/19/2017    BNP (last 3 results) No results for input(s): BNP in the last 8760 hours.  ProBNP (last 3 results) No results for input(s): PROBNP in the last 8760 hours.   Assessment/Plan:  Dizziness with standing: improved with lower dose norvasc will d/c all together as BP is fine   HLD: continue statin. Lipids from September noted.   CAD s/p CABG 1999  and PCI: low risk myoview in 09/2014. No chest pain reported.   Appendiceal cancer s/p appendectomy and hemicolectomy (04/2015): followed closely by Dr. Burr Medico  Hx of DVT: continue on Xarelto lower dose due to fall risk  - recent CBC ok. No bleeding noted.   Palpitations:  Benign no signs of recurrent afib on xarelto     Disposition:   FU with me 6 months    Jenkins Rouge

## 2017-05-16 ENCOUNTER — Ambulatory Visit (INDEPENDENT_AMBULATORY_CARE_PROVIDER_SITE_OTHER): Payer: Medicare Other | Admitting: Cardiovascular Disease

## 2017-05-16 ENCOUNTER — Encounter: Payer: Self-pay | Admitting: Cardiovascular Disease

## 2017-05-16 VITALS — BP 112/62 | HR 79 | Ht 64.0 in | Wt 170.4 lb

## 2017-05-16 DIAGNOSIS — I25119 Atherosclerotic heart disease of native coronary artery with unspecified angina pectoris: Secondary | ICD-10-CM | POA: Diagnosis not present

## 2017-05-16 DIAGNOSIS — I480 Paroxysmal atrial fibrillation: Secondary | ICD-10-CM

## 2017-05-16 NOTE — Patient Instructions (Addendum)
Medication Instructions:  Your physician has recommended you make the following change in your medication:  1-STOP Norvasc  Labwork: NONE  Testing/Procedures: NONE  Follow-Up: Your physician wants you to follow-up in: 6 months with Dr. Johnsie Cancel. You will receive a reminder letter in the mail two months in advance. If you don't receive a letter, please call our office to schedule the follow-up appointment.   If you need a refill on your cardiac medications before your next appointment, please call your pharmacy.

## 2017-05-17 ENCOUNTER — Telehealth: Payer: Self-pay

## 2017-05-17 NOTE — Telephone Encounter (Signed)
Daughter had called yesterday about collecting urine jug at [facility] rather than driving into [place]. Call was forwarded to Dr Burr Medico nurse.   Today daughter is at [place] to collect urine jug and was having difficulty. This RN called [facility] lab and they were carrying jug out to daughter as I called. There is no lab appt on the books at this time.  The receptionist at [facility] said "it looks like it is taken care of at this time" and disconnected call. Josh at [facility] was taking care of it.

## 2017-05-19 ENCOUNTER — Telehealth: Payer: Self-pay

## 2017-05-19 ENCOUNTER — Inpatient Hospital Stay: Payer: No Typology Code available for payment source | Attending: Internal Medicine

## 2017-05-19 ENCOUNTER — Other Ambulatory Visit: Payer: Medicare Other

## 2017-05-19 ENCOUNTER — Other Ambulatory Visit: Payer: Self-pay

## 2017-05-19 DIAGNOSIS — Z85030 Personal history of malignant carcinoid tumor of large intestine: Secondary | ICD-10-CM | POA: Diagnosis not present

## 2017-05-19 DIAGNOSIS — C189 Malignant neoplasm of colon, unspecified: Secondary | ICD-10-CM

## 2017-05-19 LAB — COMPREHENSIVE METABOLIC PANEL
ALBUMIN: 3.2 g/dL — AB (ref 3.5–5.0)
ALT: 11 U/L — ABNORMAL LOW (ref 14–54)
ANION GAP: 7 (ref 5–15)
AST: 17 U/L (ref 15–41)
Alkaline Phosphatase: 43 U/L (ref 38–126)
BILIRUBIN TOTAL: 0.4 mg/dL (ref 0.3–1.2)
BUN: 11 mg/dL (ref 6–20)
CALCIUM: 8.8 mg/dL — AB (ref 8.9–10.3)
CO2: 29 mmol/L (ref 22–32)
Chloride: 101 mmol/L (ref 101–111)
Creatinine, Ser: 0.73 mg/dL (ref 0.44–1.00)
GFR calc non Af Amer: >60 mL/min (ref >60)
GLUCOSE: 116 mg/dL — AB (ref 65–99)
POTASSIUM: 4 mmol/L (ref 3.5–5.1)
SODIUM: 137 mmol/L (ref 135–145)
TOTAL PROTEIN: 6.3 g/dL — AB (ref 6.5–8.1)

## 2017-05-19 LAB — CBC WITH DIFFERENTIAL/PLATELET
BASOS ABS: 0.1 10*3/uL (ref 0–0.1)
BASOS PCT: 1 %
Eosinophils Absolute: 0.2 10*3/uL (ref 0–0.7)
Eosinophils Relative: 2 %
HEMATOCRIT: 37 % (ref 35.0–47.0)
HEMOGLOBIN: 11.9 g/dL — AB (ref 12.0–16.0)
LYMPHS PCT: 17 %
Lymphs Abs: 2.2 10*3/uL (ref 1.0–3.6)
MCH: 28.7 pg (ref 26.0–34.0)
MCHC: 32.1 g/dL (ref 32.0–36.0)
MCV: 89.2 fL (ref 80.0–100.0)
MONO ABS: 0.6 10*3/uL (ref 0.2–0.9)
Monocytes Relative: 5 %
NEUTROS ABS: 9.4 10*3/uL — AB (ref 1.4–6.5)
NEUTROS PCT: 75 %
Platelets: 410 10*3/uL (ref 150–440)
RBC: 4.14 MIL/uL (ref 3.80–5.20)
RDW: 15.9 % — ABNORMAL HIGH (ref 11.5–14.5)
WBC: 12.4 10*3/uL — AB (ref 3.6–11.0)

## 2017-05-19 NOTE — Telephone Encounter (Signed)
Daughter called they are at [place] [facility] and want to get labs drawn there. They do have lab appt there. Josh the tech said they cannot release d/t the resulting agent is [facility]. S/w Tiana Loft and she verified they should be able to draw under Dr Lewayne Bunting name so long as resulting agent is [facility]. Labs reordered to reflect this. Called Josh at [facility] back to let him know. Called daughter back to let her know as well.

## 2017-05-20 LAB — CHROMOGRANIN A: Chromogranin A: 3 nmol/L (ref 0–5)

## 2017-05-20 LAB — CEA: CEA1: 5.5 ng/mL — AB (ref 0.0–4.7)

## 2017-05-25 ENCOUNTER — Encounter: Payer: Self-pay | Admitting: Cardiovascular Disease

## 2017-05-25 NOTE — Progress Notes (Signed)
[facility]  Telephone:([phone] Fax:([phone]) [phone]  Clinic Follow Up Note   Patient Care Team: Ma Hillock, DO as PCP - General (Family Medicine) Coralie Keens, MD as Consulting Physician (General Surgery) Gatha Mayer, MD as Consulting Physician (Gastroenterology) Josue Hector, MD as Consulting Physician (Cardiology) Tania Ade, RN as Registered Nurse Truitt Merle, MD as Consulting Physician (Hematology) 05/26/2017   CHIEF COMPLAINTS:  Follow up mixed carcinoids-adenocarcinoma of appendix  HISTORY OF PRESENTING ILLNESS (05/29/2015):  Rachel Conner 81 y.o. 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 She is accompanied by her son to the clinic today. She came in a wheelchair.  She presented with abdominal pain, intermittent nausea and vomiting, diarrhea, for a few months. She was seen at [facility] emergency room in September, and CT of abdomen and pelvis revealed dilated appendix. She was referred to general surgeon Dr. Ninfa Linden, who brought her to OR on 04/11/2015 for appendectomy. She tolerated surgery well. The surgical path reviewed mixed carcinoid and adenocarcinoma, with area of small perforation. She was brought back to the OR on 04/28/2015, and underwent right hemicolectomy.  She has been recovering well from her surgery. She still feels quite fatigued, she is able to do most ADLs, but not much other activities. She has physical therapist and occupational therapist coming to her house twice a week. Her appetite has improved, she is eating better. Her pain incision has been much improved.  CURRENT THERAPY:  surveillance  INTERIM HISTORY  Rachel Conner returns for follow-up. She was last seen by me 6 months ago. She presents to the clinic today accompanied by her daughter. She notes she has been unconscious form syncope in the yard for hours until she was found. She did not take her medication  correctly and was not eating. She is now in [facility] rehab and is doing much better, updated her medication and eating better. She does PT 6 times a day and her daughter lives 10 minutes away. She plans to leave rehab next week. She can walk by herself. She has overall lost weight since last visit. She plans to leave for [place] in 2-3 weeks and will be gone for a month. Upon retuning she plans to move to [facility] for assisted living.  She has a lot of nausea and some abdominal pain. Her original pain over her appendix has returned over the past 3 days and is ongoing. She has not been sleeping well lately. She recently started having constipation then yesterday she started diarrhea. She denies a fever.    MEDICAL HISTORY:  Past Medical History:  Diagnosis Date  . Anemia   . Anxiety   . Arthritis    Osteoarthrosis  . CAD (coronary artery disease) 1999   CABG  . Cancer [facility])    cancer of the appendix   . Cataract    rt eye per pt  . CHF (congestive heart failure) ([place])   . Colon cancer ([place]) 2017  . Colon polyps   . Confusion, postoperative   . DVT (deep venous thrombosis) ([place])   . GERD (gastroesophageal reflux disease)   . Hemorrhoids   . Hiatal hernia   . HLD (hyperlipidemia)   . HTN (hypertension)   . Hypothyroidism   . Major depressive disorder   . MI (myocardial infarction) ([place])   . Peripheral neuropathy   . Urinary incontinence     SURGICAL HISTORY: Past Surgical History:  Procedure Laterality Date  . CAROTID  STENT  07/1998   stent RCA   . CHOLECYSTECTOMY  2011  . COLON SURGERY  03/2015   partial colon to remove cancer in appendix  . COLONOSCOPY W/ BIOPSIES    . CORONARY ARTERY BYPASS GRAFT  1999   6 vessel per pt.  . CORONARY STENT PLACEMENT  10/2001   circumflex stent  . ESOPHAGOGASTRODUODENOSCOPY    . LAPAROSCOPIC APPENDECTOMY N/A 04/11/2015   Procedure: LAPAROSCOPIC APPENDECTOMY;  Surgeon: Coralie Keens, MD;  Location: [facility];  Service: General;  Laterality:  N/A;  Laparoscopic converted to open appendectomy   . LUMBAR LAMINECTOMY     fusion 1971  . PARTIAL COLECTOMY N/A 04/28/2015   Procedure: Ileocecectomy;  Surgeon: Coralie Keens, MD;  Location: [facility];  Service: General;  Laterality: N/A;  . Right Thumb Right    Thumb Finger Release  . THYROID SURGERY      SOCIAL HISTORY: Social History   Socioeconomic History  . Marital status: Widowed    Spouse name: Not on file  . Number of children: 3  . Years of education: 36  . Highest education level: Not on file  Social Needs  . Financial resource strain: Not on file  . Food insecurity - worry: Not on file  . Food insecurity - inability: Not on file  . Transportation needs - medical: Not on file  . Transportation needs - non-medical: Not on file  Occupational History  . Occupation: retired EMT  Tobacco Use  . Smoking status: Former Smoker    Packs/day: 1.00    Years: 45.00    Pack years: 45.00    Last attempt to quit: 07/12/1997    Years since quitting: 19.8  . Smokeless tobacco: Never Used  . Tobacco comment: quit smoking age 35  Substance and Sexual Activity  . Alcohol use: No    Alcohol/week: 0.0 oz  . Drug use: No  . Sexual activity: No  Other Topics Concern  . Not on file  Social History Narrative   Widowed, lives alone, son and DIL live close.    Retired from being EMT/med tech   Originally from [place]   Able to perform ADL's independently   [place] a daily vitamin   Wears her seatbelt   Wears dentures   Smoke detector in the home.   Feels safe in her relationships    FAMILY HISTORY: Family History  Problem Relation Age of Onset  . Heart disease Father   . Heart disease Mother   . Heart disease Brother   . Cancer Sister        non hodgkins lymphoma  . Heart disease Other        grandfather (side unknown)  . Colon cancer Neg Hx     ALLERGIES:  is allergic to niacin; nitrogen; nitroglycerin; myrbetriq [mirabegron]; and niaspan [niacin er].  MEDICATIONS:    Current Outpatient Medications  Medication Sig Dispense Refill  . acetaminophen (TYLENOL) 325 MG tablet Take 650 mg every 6 (six) hours as needed by mouth.    Marland Kitchen aspirin EC 81 MG tablet Take 81 mg by mouth 3 (three) times a week.     Marland Kitchen atorvastatin (LIPITOR) 40 MG tablet Take 1 tablet (40 mg total) by mouth daily at 6 PM. 30 tablet 0  . dextroamphetamine (DEXEDRINE SPANSULE) 5 MG 24 hr capsule Take 5 mg daily by mouth.    . famotidine (PEPCID) 20 MG tablet Take 1 tablet (20 mg total) by mouth daily. 30 tablet 0  .  feeding supplement, ENSURE ENLIVE, (ENSURE ENLIVE) LIQD Take 237 mLs by mouth 2 (two) times daily between meals. 237 mL 12  . levothyroxine (SYNTHROID, LEVOTHROID) 100 MCG tablet Take 1 tablet (100 mcg total) by mouth daily. 90 tablet 3  . Melatonin 1 MG CAPS Take 3 mg daily by mouth.    . ondansetron (ZOFRAN) 4 MG tablet Take 4 mg every 8 (eight) hours as needed by mouth for nausea or vomiting.    . polyethylene glycol (MIRALAX / GLYCOLAX) packet Take 17 g daily as needed by mouth.    . potassium chloride (K-DUR) 10 MEQ tablet Take 1 tablet (10 mEq total) by mouth daily. 10 tablet 0  . sennosides-docusate sodium (SENOKOT-S) 8.6-50 MG tablet Take 1 tablet daily by mouth.    . TRAZODONE HCL PO Take 50 mg as needed by mouth.    Alveda Reasons 15 MG TABS tablet TAKE 1 TABLET DAILY WITH SUPPER (Patient taking differently: Take 1 tablet (15 mg) by mouth daily with supper) 90 tablet 1  . prochlorperazine (COMPAZINE) 5 MG tablet Take 1-2 tablets (5-10 mg total) every 6 (six) hours as needed by mouth for nausea or vomiting. 30 tablet 2   No current facility-administered medications for this visit.     REVIEW OF SYSTEMS:   Constitutional: Denies fevers, chills or abnormal night sweats (+) weight loss  Eyes: Denies blurriness of vision, double vision or watery eyes Ears, nose, mouth, throat, and face: Denies mucositis or sore throat Respiratory: Denies cough, dyspnea or wheezes Cardiovascular:  Denies palpitation, chest discomfort or lower extremity swelling Gastrointestinal:  Denies heartburn, (+) nausea, (+) bloating, (+) constipation/diarrhea (+) abdominal pain Skin: Denies abnormal skin rashes Lymphatics: Denies new lymphadenopathy or easy bruising Neurological: Denies numbness, tingling or new weaknesses Behavioral/Psych: Mood is stable, no new changes  All other systems were reviewed with the patient and are negative.  PHYSICAL EXAMINATION: ECOG PERFORMANCE STATUS: 1  Vitals:   05/26/17 1232  BP: (!) 154/57  Pulse: 70  Resp: 18  Temp: 98.1 F (36.7 C)  SpO2: 98%   Filed Weights   05/26/17 1232  Weight: 166 lb 4.8 oz (75.4 kg)    GENERAL:alert, no distress and comfortable SKIN: skin color, texture, turgor are normal, no rashes or significant lesions EYES: normal, conjunctiva are pink and non-injected, sclera clear OROPHARYNX:no exudate, no erythema and lips, buccal mucosa, and tongue normal  NECK: supple, thyroid normal size, non-tender, without nodularity LYMPH:  no palpable lymphadenopathy in the cervical, axillary or inguinal LUNGS: clear to auscultation and percussion with normal breathing effort HEART: regular rate & rhythm and no murmurs and no lower extremity edema ABDOMEN:abdomen soft, non-tender. No organomegaly. (+) surgical incision sites are healing well, (+) sluggish bowels Musculoskeletal:no cyanosis of digits and no clubbing  PSYCH: alert & oriented x 3 with fluent speech NEURO: no focal motor/sensory deficits  LABORATORY DATA:  I have reviewed the data as listed CBC Latest Ref Rng & Units 05/19/2017 05/05/2017 04/22/2017  WBC 3.6 - 11.0 K/uL 12.4(H) 7.5 9.9  Hemoglobin 12.0 - 16.0 g/dL 11.9(L) 11.9(L) 13.3  Hematocrit 35.0 - 47.0 % 37.0 37.6 41.9  Platelets 150 - 440 K/uL 410 248 278    CMP Latest Ref Rng & Units 05/19/2017 05/05/2017 04/22/2017  Glucose 65 - 99 mg/dL 116(H) 127(H) 108(H)  BUN 6 - 20 mg/dL 11 11 19   Creatinine 0.44 - 1.00  mg/dL 0.73 0.97 0.70  Sodium 135 - 145 mmol/L 137 139 141  Potassium 3.5 - 5.1  mmol/L 4.0 3.4(L) 3.6  Chloride 101 - 111 mmol/L 101 101 107  CO2 22 - 32 mmol/L 29 30 27   Calcium 8.9 - 10.3 mg/dL 8.8(L) 9.2 9.3  Total Protein 6.5 - 8.1 g/dL 6.3(L) - -  Total Bilirubin 0.3 - 1.2 mg/dL 0.4 - -  Alkaline Phos 38 - 126 U/L 43 - -  AST 15 - 41 U/L 17 - -  ALT 14 - 54 U/L 11(L) - -   Chromogranin A (0-5 nmol/L) 04/16/2016: 9 08/02/2016: 2 05/19/17: 3  24-urine 5 HIAA (0-15mg /24h) 04/16/2016: 2.2   CEA 04/16/2016: 3.8 08/02/2016: 3.6 11/04/2016: 3.08 05/19/17: 5.5   Results for Rachel Conner, Rachel Conner (MRN [id number]) as of 04/23/2016 06:49  Ref. Range 06/19/2015 09:30 03/11/2016 15:26 04/16/2016 11:30  Iron Latest Ref Range: 41 - 142 ug/dL 23 (L)  43  UIBC Latest Ref Range: 120 - 384 ug/dL 345  297  TIBC Latest Ref Range: 236 - 444 ug/dL 368  340  %SAT Latest Ref Range: 21 - 57 % 6 (L)  13 (L)  Ferritin Latest Ref Range: 9 - 269 ng/ml 20 8.6 (L) 22    PATHOLOGY REPORT  Diagnosis 04/28/2015 Colon, segmental resection for tumor, Distal illium and cecum - MIXED GOBLET CELL CARCINOID/CARCINOMA-ADENOCARCINOMA, SEE COMMENT. - TUMOR INVOLVES APPENDICEAL OS. - TUMOR INVOLVES FIBROFATTY SOFT TISSUE. AROUND THE APPENDICEAL OS - ONE LYMPH NODE, POSITIVE FOR METASTATIC TUMOR (1/5). - SURGICAL MARGINS, NEGATIVE FOR TUMOR. Microscopic Comment The previous appendectomy demonstrating mixed goblet cell carcinoid/carcinoma-adenocarcinoma is noted ([id number]). Please see previous case for additional tumor information. (CRR:ecj 04/29/2015)  Diagnosis 04/11/2015 Appendix, Other than Incidental - MIXED GOBLET CELL CARCINOID-ADENOCARCINOMA, GRADE 3, SPANNING 4.5 CM. - TUMOR INVADES THROUGH SEROSA. - PERINEURAL INVASION PRESENT. - RESECTION MARGIN IS POSITIVE. - SEE ONCOLOGY TABLE. Microscopic Comment APPENDIX: Specimen: Appendix. Procedure: Appendectomy. Specimen Integrity: Disrupted.  Specimen Size: 4.5  x 3.0 x 1.9 cm. Tumor Site: Diffuse involving appendix. Tumor Size: Approximately 4.5 cm. Histologic Type: Mixed goblet cell carcinoma-adenocarcinoma. Histologic Grade: Grade 3 (poorly differentiated) Microscopic Tumor Extension: Through serosa. Margins: Proximal Margin: Involved. Mesenteric Margin: N/A. Adenoma present at proximal margin: No. Lymph-Vascular Invasion: Not identified. Perineural Invasion: Present. Peritumoral Nodules (tumor deposits): Not identified. Lymph nodes: number examined 0; number positive: 0 TNM: pT4, pNX Ancillary studies: Can be performed upon request. Comments: The tumor consists of infiltrating nests and single goblet cells. Additionally there are areas of conventional gland forming adenocarcinoma. Immunohistochemistry reveals the goblet cells are positive for synaptophysin, chromogranin, and CD56. The tumor diffusely involves the appendix from the tip to the resection margin and is thus estimated at 4.5 cm in size. There is an area of possible perforation, although this is difficult to assess with the disrupted nature of the specimen. Tumor invades through the serosa. Dr. Donato Heinz has reviewed the case. The case was called to Dr. Ninfa Linden on 04/15/2015.   RADIOGRAPHIC STUDIES: I have personally reviewed the radiological images as listed and agreed with the findings in the report. No results found.  ASSESSMENT & PLAN:  81 y.o. Caucasian female with:  1. Mixed goblet cell carcinoid-adenocarcinoma of appendix, pT4aN1M0, stage IIIB -I previously reviewed her surgical pathology results with her in great details. The stage was also discussed with her. -we previously discussed that appendiceal mixed carcinoid and adenocarcinoma is rare type tumor, and we do not have large body of clinical trial data to guild [place] about the adjuvant chemotherapy and surveillance. -due to the component of adenocarcinoma, and advanced stage, high risk  features including a T4 disease,  positive nodes, and possible appendiceal perforation, I do think her risk of recurrence is high. -Patient previously declined adjuvant chemotherapy.  -She is clinically doing well, exam unremarkable. I reviewed her lab test results from last week, which showed normal CBC, CMP, CEA, chromogranin A and a 24-hour urine HIAA.  -Her screening colonoscopy in 02/2016 was negative -No clinical concern for recurrence. - Reviewed CT scan results from 11/04/16 with the patient and her family; no evidence of recurrent or metastatic disease.  -Continue surveillance, for total 5 years. -She notes recent abdominal pain and constipation/diarrhea in conjunction with her bloating and nausea. I will do a repeat CT scan before she plans to travel to [place].  -Labs reviewed from 05/19/17, CEA slightly elevated, Chromogranin A is normal.  -F/u in 4 months    2. Iron deficient anemia secondary to blood loss  -Resolved -Her previous colonoscopy was negative - She has previously stopped taking oral iron pill - Hgb 12.8 on 11/11/16 - I previously encouraged the patient to continue OTC oral iron.  3. HTN, hypothyroidism, CHF -She'll continue follow-up with her primary care physician  4. Depression  -She lives alone, socially isolated -I encouraged her to be more active.  -I encouraged her to follow-up with her primary care physician and discuss antidepressant medication. - the patient started Lexapro in 10/2016 -Improved lately   5. Anorexia and nausea  -Possibly related to depression -I refilled her Compazine on 11/11/16, she will use as needed. -I encouraged her to follow-up with Dr. Carlean Purl if needed -Weight loss since last visit. She has been eating better lately.  -Refilled Compazine today (05/26/17)  6. Abdominal Bloating - The patient experienced bloating on and off - I encouraged the patient to use Miralax regularly to control this -Has started experiencing pain and constipation/diarrhea with this. She  will let us know if this pain persists.     Plan -Refill Compazine today  -CT AP with contrast in 1-2 weeks, I will call her after scan to review scan findings  -Lab and f/u in 4 months    All questions were answered. The patient knows to call the clinic with any problems, questions or concerns.  I spent 25 minutes counseling the patient face to face. The total time spent in the appointment was 30 minutes and more than 50% was on counseling.  This document serves as a record of services personally performed by Truitt Merle, MD. It was created on her behalf by Joslyn Devon, a trained medical scribe. The creation of this record is based on the scribe's personal observations and the provider's statements to them.   I have reviewed the above documentation for accuracy and completeness, and I agree with the above.     Truitt Merle, MD 05/26/2017

## 2017-05-26 ENCOUNTER — Ambulatory Visit (HOSPITAL_BASED_OUTPATIENT_CLINIC_OR_DEPARTMENT_OTHER): Payer: Medicare Other | Admitting: Hematology

## 2017-05-26 ENCOUNTER — Telehealth: Payer: Self-pay | Admitting: Hematology

## 2017-05-26 ENCOUNTER — Encounter: Payer: Self-pay | Admitting: Hematology

## 2017-05-26 VITALS — BP 154/57 | HR 70 | Temp 98.1°F | Resp 18 | Ht 64.0 in | Wt 166.3 lb

## 2017-05-26 DIAGNOSIS — E039 Hypothyroidism, unspecified: Secondary | ICD-10-CM | POA: Diagnosis not present

## 2017-05-26 DIAGNOSIS — I509 Heart failure, unspecified: Secondary | ICD-10-CM

## 2017-05-26 DIAGNOSIS — Z8589 Personal history of malignant neoplasm of other organs and systems: Secondary | ICD-10-CM | POA: Diagnosis present

## 2017-05-26 DIAGNOSIS — R630 Anorexia: Secondary | ICD-10-CM | POA: Diagnosis not present

## 2017-05-26 DIAGNOSIS — I10 Essential (primary) hypertension: Secondary | ICD-10-CM | POA: Diagnosis not present

## 2017-05-26 DIAGNOSIS — C189 Malignant neoplasm of colon, unspecified: Secondary | ICD-10-CM

## 2017-05-26 DIAGNOSIS — R110 Nausea: Secondary | ICD-10-CM

## 2017-05-26 MED ORDER — PROCHLORPERAZINE MALEATE 5 MG PO TABS: 5.0000 mg | ORAL_TABLET | Freq: Four times a day (QID) | ORAL | 2 refills | Status: DC | PRN

## 2017-05-26 NOTE — Telephone Encounter (Signed)
Gave patient avs and calendar with appts per 11/15 los.  °

## 2017-05-27 ENCOUNTER — Encounter: Payer: Self-pay | Admitting: Hematology

## 2017-06-06 ENCOUNTER — Ambulatory Visit (HOSPITAL_COMMUNITY)
Admission: RE | Admit: 2017-06-06 | Discharge: 2017-06-06 | Disposition: A | Discharge status: Home / Self Care | Payer: Medicare Other | Source: Ambulatory Visit | Attending: Hematology | Admitting: Hematology

## 2017-06-06 ENCOUNTER — Encounter (HOSPITAL_COMMUNITY): Payer: Self-pay

## 2017-06-06 DIAGNOSIS — C189 Malignant neoplasm of colon, unspecified: Secondary | ICD-10-CM | POA: Diagnosis not present

## 2017-06-06 MED ORDER — IOPAMIDOL (ISOVUE-300) INJECTION 61%
100.0000 mL | Freq: Once | INTRAVENOUS | Status: AC | PRN
  Administered 2017-06-06: 100 mL via INTRAVENOUS

## 2017-06-06 MED ORDER — IOPAMIDOL (ISOVUE-300) INJECTION 61%
INTRAVENOUS | Status: AC
  Filled 2017-06-06: qty 100

## 2017-06-07 ENCOUNTER — Telehealth: Payer: Self-pay | Admitting: *Deleted

## 2017-06-07 NOTE — Telephone Encounter (Signed)
Received a voice mail from [facility] saying patient had been discharged from inpatient rehab and they were starting [place] and needed Dr Raoul Pitch to sign orders. Called Awilda Metro at [facility] explained to her Dr Raoul Pitch has not seen this patient in follow up and will not sign home health orders until patient seen here for evaluation of Home Health needs. Called and spoke to patient daughter and explained situation. Patient daughter states her mom is leaving in a few days for [place] and she is not sure when she is coming back. Explained to her we cannot authorize home health for patient without her being seen . She verbalized understanding and states she is aware of this since speaking with Home Health agency but states they do not have time to come in and will just follow up at her appt in January. Patient daughter is aware orders will not be signed for [facility] at this time.

## 2017-06-08 ENCOUNTER — Other Ambulatory Visit: Payer: Self-pay | Admitting: *Deleted

## 2017-06-08 ENCOUNTER — Other Ambulatory Visit: Payer: Self-pay | Admitting: Hematology

## 2017-06-08 ENCOUNTER — Telehealth: Payer: Self-pay | Admitting: Hematology

## 2017-06-08 DIAGNOSIS — C189 Malignant neoplasm of colon, unspecified: Secondary | ICD-10-CM

## 2017-06-08 MED ORDER — TRAMADOL HCL 50 MG PO TABS: 50.0000 mg | ORAL_TABLET | Freq: Three times a day (TID) | ORAL | 0 refills | Status: DC | PRN

## 2017-06-08 NOTE — Telephone Encounter (Signed)
I called pt's daughter and reviewed her recent CT scan findings.  Patient has been discharged from rehab a week ago, and is going to [place] in 3 days she plans to stay for up to a month.  She feels well overall, still has mild abdominal pain in the right lower quadrant.  I recommend her to have a PET scan when she returns from the trip, to further evaluate the new peritoneal nodules, to rule out peritoneal recurrence.  If PET scan positive, I will get biopsy if it is feasible.  Plan to see her back after the PET scan in Jan 2019.  Patient's daughter voiced good understanding, and will pass the message to patient, she agrees with the plan.  She appreciated the call.  Truitt Merle  06/08/2017

## 2017-06-13 ENCOUNTER — Telehealth: Payer: Self-pay | Admitting: Hematology

## 2017-06-13 NOTE — Telephone Encounter (Signed)
Spoke to patients daughter regarding upcoming January appointments per 11/28 sch message

## 2017-06-15 ENCOUNTER — Telehealth: Payer: Self-pay | Admitting: *Deleted

## 2017-06-15 ENCOUNTER — Telehealth: Payer: Self-pay

## 2017-06-15 ENCOUNTER — Telehealth: Payer: Self-pay | Admitting: Family Medicine

## 2017-06-15 NOTE — Telephone Encounter (Signed)
Pt is in [place], she has suddenly become violent and forgetting things. She is in [facility]. Pt's mother called to give permission for release of information. She cannot come in to sign papers b/c she is recovering from back surgery and is not free to drive yet. The doctor is Dr Remonia Richter.  S/w Dr Burr Medico, she is aware of situation and are working on it. Called lynn back to let her know. Jeani Hawking knew fax#.  Country code [phone]? Or  ([phone] from internet) fax [phone]

## 2017-06-15 NOTE — Telephone Encounter (Signed)
Received call from MD in [facility] [place]. Patient's daughter Rachel [facility]ner) bought patient a one way ticket to [place] to place her in brother's care. Brother has dropped patient off at the [facility] with limited printed information from our office and her oncologist Rachel Conner. The brother has left her at the [facility]. He is not present.  The physician is asking if he can get information about the patient. The patient is very confused, thinks she is still in [place] so he is unable to obtain any medical history.

## 2017-06-15 NOTE — Telephone Encounter (Signed)
Received vm call this am from Dr Meriel Flavors? In [place] stating pt is there in their hospital with no medical records & asked for return call at [phone].  Unable to return call due to no auth for international call.  Message sent to HIM to f/u to see who can make international call.

## 2017-06-16 NOTE — Telephone Encounter (Signed)
I would recommend they be sent her history via secured email if possible. Especially containing her establishment visit in April 2018. Patient has only been seen here twice. I do not have more information on her than is in my original note from establishment.

## 2017-06-16 NOTE — Telephone Encounter (Signed)
Physician to physician calls for continuity of care are permissible, just an FYI. Let me know if you decide to contact the provider and need assistance dialing out of country.

## 2017-06-27 ENCOUNTER — Telehealth: Payer: Self-pay | Admitting: *Deleted

## 2017-06-27 NOTE — Telephone Encounter (Signed)
Received vm call from [facility] requesting notes from Dr Burr Medico to see if they can accept her into their community.  Called daughter, Jeani Hawking & she states pt will be returning thurs to [facility].  Req that she sign release of info for [facility] to req records.  She will do this.

## 2017-06-28 ENCOUNTER — Telehealth: Payer: Self-pay | Admitting: *Deleted

## 2017-06-28 NOTE — Telephone Encounter (Signed)
Received call from [facility] yesterday requesting pt's records to be faxed to the nursing center.   Spoke with Cristie Hem today, and was informed that no records needed from the office - " I think we got everything we need yesterday ".  Per Cristie Hem, pt will be residing at [facility] center starting Thursday  06/30/17. Informed Alex that a signed release of information by daughter [name] will be needed in the future if they need pt's records. Alex voiced understanding. Alex's    Phone     [phone].

## 2017-07-04 ENCOUNTER — Encounter: Payer: Self-pay | Admitting: Emergency Medicine

## 2017-07-04 ENCOUNTER — Emergency Department
Admission: EM | Admit: 2017-07-04 | Discharge: 2017-07-04 | Disposition: A | Discharge status: Home / Self Care | Payer: Medicare Other | Attending: Emergency Medicine | Admitting: Emergency Medicine

## 2017-07-04 DIAGNOSIS — E039 Hypothyroidism, unspecified: Secondary | ICD-10-CM | POA: Insufficient documentation

## 2017-07-04 DIAGNOSIS — Z87891 Personal history of nicotine dependence: Secondary | ICD-10-CM | POA: Diagnosis not present

## 2017-07-04 DIAGNOSIS — F329 Major depressive disorder, single episode, unspecified: Secondary | ICD-10-CM | POA: Diagnosis not present

## 2017-07-04 DIAGNOSIS — F0390 Unspecified dementia without behavioral disturbance: Secondary | ICD-10-CM | POA: Diagnosis not present

## 2017-07-04 DIAGNOSIS — Z951 Presence of aortocoronary bypass graft: Secondary | ICD-10-CM | POA: Diagnosis not present

## 2017-07-04 DIAGNOSIS — I2510 Atherosclerotic heart disease of native coronary artery without angina pectoris: Secondary | ICD-10-CM | POA: Diagnosis not present

## 2017-07-04 DIAGNOSIS — Z79899 Other long term (current) drug therapy: Secondary | ICD-10-CM | POA: Diagnosis not present

## 2017-07-04 DIAGNOSIS — Z9049 Acquired absence of other specified parts of digestive tract: Secondary | ICD-10-CM | POA: Diagnosis not present

## 2017-07-04 DIAGNOSIS — F419 Anxiety disorder, unspecified: Secondary | ICD-10-CM | POA: Diagnosis not present

## 2017-07-04 DIAGNOSIS — Z85038 Personal history of other malignant neoplasm of large intestine: Secondary | ICD-10-CM | POA: Insufficient documentation

## 2017-07-04 DIAGNOSIS — I252 Old myocardial infarction: Secondary | ICD-10-CM | POA: Insufficient documentation

## 2017-07-04 DIAGNOSIS — I110 Hypertensive heart disease with heart failure: Secondary | ICD-10-CM | POA: Diagnosis not present

## 2017-07-04 DIAGNOSIS — Z7982 Long term (current) use of aspirin: Secondary | ICD-10-CM | POA: Diagnosis not present

## 2017-07-04 DIAGNOSIS — I509 Heart failure, unspecified: Secondary | ICD-10-CM | POA: Insufficient documentation

## 2017-07-04 DIAGNOSIS — Z7901 Long term (current) use of anticoagulants: Secondary | ICD-10-CM | POA: Insufficient documentation

## 2017-07-04 LAB — COMPREHENSIVE METABOLIC PANEL
ALBUMIN: 4.1 g/dL (ref 3.5–5.0)
ALT: 13 U/L — AB (ref 14–54)
AST: 22 U/L (ref 15–41)
Alkaline Phosphatase: 47 U/L (ref 38–126)
Anion gap: 7 (ref 5–15)
BUN: 9 mg/dL (ref 6–20)
CALCIUM: 9.3 mg/dL (ref 8.9–10.3)
CO2: 27 mmol/L (ref 22–32)
CREATININE: 0.74 mg/dL (ref 0.44–1.00)
Chloride: 104 mmol/L (ref 101–111)
GFR calc Af Amer: >60 mL/min (ref >60)
GFR calc non Af Amer: >60 mL/min (ref >60)
GLUCOSE: 109 mg/dL — AB (ref 65–99)
Potassium: 3.5 mmol/L (ref 3.5–5.1)
SODIUM: 138 mmol/L (ref 135–145)
Total Bilirubin: 0.2 mg/dL — ABNORMAL LOW (ref 0.3–1.2)
Total Protein: 7.6 g/dL (ref 6.5–8.1)

## 2017-07-04 LAB — CBC WITH DIFFERENTIAL/PLATELET
Basophils Absolute: 0.1 10*3/uL (ref 0–0.1)
Basophils Relative: 1 %
EOS ABS: 0.1 10*3/uL (ref 0–0.7)
Eosinophils Relative: 2 %
HCT: 41.3 % (ref 35.0–47.0)
HEMOGLOBIN: 13.3 g/dL (ref 12.0–16.0)
LYMPHS ABS: 2.2 10*3/uL (ref 1.0–3.6)
Lymphocytes Relative: 26 %
MCH: 29 pg (ref 26.0–34.0)
MCHC: 32.2 g/dL (ref 32.0–36.0)
MCV: 90.3 fL (ref 80.0–100.0)
Monocytes Absolute: 0.8 10*3/uL (ref 0.2–0.9)
Monocytes Relative: 9 %
NEUTROS PCT: 62 %
Neutro Abs: 5.3 10*3/uL (ref 1.4–6.5)
Platelets: 339 10*3/uL (ref 150–440)
RBC: 4.57 MIL/uL (ref 3.80–5.20)
RDW: 17.1 % — ABNORMAL HIGH (ref 11.5–14.5)
WBC: 8.4 10*3/uL (ref 3.6–11.0)

## 2017-07-04 LAB — URINALYSIS, COMPLETE (UACMP) WITH MICROSCOPIC
BACTERIA UA: NONE SEEN
Bilirubin Urine: NEGATIVE
Glucose, UA: NEGATIVE mg/dL
Hgb urine dipstick: NEGATIVE
Ketones, ur: NEGATIVE mg/dL
Leukocytes, UA: NEGATIVE
Nitrite: NEGATIVE
PH: 7 (ref 5.0–8.0)
Protein, ur: NEGATIVE mg/dL
SPECIFIC GRAVITY, URINE: 1.006 (ref 1.005–1.030)
WBC, UA: NONE SEEN WBC/hpf (ref 0–5)

## 2017-07-04 NOTE — ED Triage Notes (Signed)
Patient presents to ED via [facility] from [facility]. Staff called 911 because patient was wondering the halls. Staff reported to EMS "she is acting different". EMS has been in contact with patients family and family reports patients behavior is normal for her. Patient denies any complaints at this time. Patient was placed at [facility] on Saturday.

## 2017-07-04 NOTE — ED Notes (Signed)
Pt attempting to get out of bed several times. This tech has been in room several times to redirect pt. Pt keeps asking why she can't get out of bed. This tech has told pt that if she gets out of bed she may fall. Pt stated "tough shit I don't care if I fall." Door remains open and bed monitor is in place.

## 2017-07-04 NOTE — ED Notes (Signed)
Spoke with Dr. Jimmye Norman in regards to patient presentation. See orders.

## 2017-07-04 NOTE — ED Notes (Signed)
Pt brief and bed linen changed.

## 2017-07-04 NOTE — ED Notes (Signed)
Resumed care from [facility] rn. Pt alert, confused. Bed alarm placed on bed.  Cath ua to lab.

## 2017-07-04 NOTE — ED Provider Notes (Addendum)
[facility] Emergency Department Provider Note  ____________________________________________   I have reviewed the triage vital signs and the nursing notes. Where available I have reviewed prior notes and, if possible and indicated, outside hospital notes.    HISTORY  Chief Complaint Evaluation    HPI Rachel Conner is a 81 y.o. female with a history of anxiety and dementia, presents today because she is new at her nursing home and they felt that he wanted to make sure she was in her normal state of health.  Family is at bedside they state that she is in her normal state of health.  Apparently the nursing home wanted a urinary tract infection check.  That has been done and is negative.  Patient has no complaints.  She is pleasantly demented, has no further issues to discuss with me.  According to family she is exactly at her baseline.  Level 5 chart caveat; no further history available due to patient status.      Past Medical History:  Diagnosis Date  . Anemia   . Anxiety   . Arthritis    Osteoarthrosis  . CAD (coronary artery disease) 1999   CABG  . Cancer [facility])    cancer of the appendix   . Cataract    rt eye per pt  . CHF (congestive heart failure) ([place])   . Colon cancer ([place]) 2017  . Colon polyps   . Confusion, postoperative   . DVT (deep venous thrombosis) ([place])   . GERD (gastroesophageal reflux disease)   . Hemorrhoids   . Hiatal hernia   . HLD (hyperlipidemia)   . HTN (hypertension)   . Hypothyroidism   . Major depressive disorder   . MI (myocardial infarction) ([place])   . Peripheral neuropathy   . Urinary incontinence     Patient Active Problem List   Diagnosis Date Noted  . Acute encephalopathy 04/19/2017  . Hypokalemia 04/19/2017  . Prolonged QT interval 04/19/2017  . Sepsis ([place]) 04/18/2017  . Chronic prescription benzodiazepine use 02/03/2017  . Essential hypertension 11/03/2016  . Chronic seasonal allergic rhinitis due to  pollen 10/13/2016  . Gastroesophageal reflux disease without esophagitis 10/13/2016  . Depression with anxiety 10/13/2016  . Mixed carcinoid-adenocarcinoma of colon ([place]) 04/28/2015  . Vascular dementia 11/07/2013  . Long term current use of anticoagulant therapy 04/03/2013  . DVT (deep venous thrombosis) ([place]) 03/06/2013  . Hypothyroidism 05/20/2008  . HYPERCHOLESTEROLEMIA, MIXED 05/20/2008  . HYPERTENSIVE CARDIOVASCULAR DISEASE, BENIGN 05/20/2008  . CORONARY ATHEROSCLEROSIS, NATIVE VESSEL 05/20/2008  . ANEMIA 01/26/2008    Past Surgical History:  Procedure Laterality Date  . CAROTID STENT  07/1998   stent RCA   . CHOLECYSTECTOMY  2011  . COLON SURGERY  03/2015   partial colon to remove cancer in appendix  . COLONOSCOPY W/ BIOPSIES    . CORONARY ARTERY BYPASS GRAFT  1999   6 vessel per pt.  . CORONARY STENT PLACEMENT  10/2001   circumflex stent  . ESOPHAGOGASTRODUODENOSCOPY    . LAPAROSCOPIC APPENDECTOMY N/A 04/11/2015   Procedure: LAPAROSCOPIC APPENDECTOMY;  Surgeon: Coralie Keens, MD;  Location: [place];  Service: General;  Laterality: N/A;  Laparoscopic converted to open appendectomy   . LUMBAR LAMINECTOMY     fusion 1971  . PARTIAL COLECTOMY N/A 04/28/2015   Procedure: Ileocecectomy;  Surgeon: Coralie Keens, MD;  Location: [place];  Service: General;  Laterality: N/A;  . Right Thumb Right    Thumb Finger Release  . THYROID SURGERY  Prior to Admission medications   Medication Sig Start Date End Date Taking? Authorizing Provider  acetaminophen (TYLENOL) 325 MG tablet Take 650 mg every 6 (six) hours as needed by mouth.    [provider]  aspirin EC 81 MG tablet Take 81 mg by mouth 3 (three) times a week.     [provider]  atorvastatin (LIPITOR) 40 MG tablet Take 1 tablet (40 mg total) by mouth daily at 6 PM. 04/22/17   Lavina Hamman, MD  dextroamphetamine (DEXEDRINE SPANSULE) 5 MG 24 hr capsule Take 5 mg daily by mouth.    [provider]  famotidine (PEPCID) 20 MG tablet Take 1 tablet (20 mg total) by mouth daily. 04/22/17   Lavina Hamman, MD  feeding supplement, ENSURE ENLIVE, (ENSURE ENLIVE) LIQD Take 237 mLs by mouth 2 (two) times daily between meals. 04/22/17   Lavina Hamman, MD  levothyroxine (SYNTHROID, LEVOTHROID) 100 MCG tablet Take 1 tablet (100 mcg total) by mouth daily. 02/04/17   Kuneff, Renee A, DO  Melatonin 1 MG CAPS Take 3 mg daily by mouth.    [provider]  ondansetron (ZOFRAN) 4 MG tablet Take 4 mg every 8 (eight) hours as needed by mouth for nausea or vomiting.    [provider]  polyethylene glycol (MIRALAX / GLYCOLAX) packet Take 17 g daily as needed by mouth.    [provider]  potassium chloride (K-DUR) 10 MEQ tablet Take 1 tablet (10 mEq total) by mouth daily. 04/22/17   Lavina Hamman, MD  prochlorperazine (COMPAZINE) 5 MG tablet Take 1-2 tablets (5-10 mg total) every 6 (six) hours as needed by mouth for nausea or vomiting. 05/26/17   Truitt Merle, MD  sennosides-docusate sodium (SENOKOT-S) 8.6-50 MG tablet Take 1 tablet daily by mouth.    [provider]  traMADol (ULTRAM) 50 MG tablet Take 1 tablet (50 mg total) by mouth 3 (three) times daily as needed for moderate pain. 06/08/17   Truitt Merle, MD  TRAZODONE HCL PO Take 50 mg as needed by mouth.    [provider]  XARELTO 15 MG TABS tablet TAKE 1 TABLET DAILY WITH SUPPER Patient taking differently: Take 1 tablet (15 mg) by mouth daily with supper 03/28/17   Josue Hector, MD    Allergies Niacin; Nitrogen; Nitroglycerin; Myrbetriq [mirabegron]; and Niaspan [niacin er]  Family History  Problem Relation Age of Onset  . Heart disease Father   . Heart disease Mother   . Heart disease Brother   . Cancer Sister        non hodgkins lymphoma  . Heart disease Other        grandfather (side unknown)  . Colon cancer Neg Hx     Social History Social History   Tobacco Use  . Smoking status: Former  Smoker    Packs/day: 1.00    Years: 45.00    Pack years: 45.00    Last attempt to quit: 07/12/1997    Years since quitting: 19.9  . Smokeless tobacco: Never Used  . Tobacco comment: quit smoking age 40  Substance Use Topics  . Alcohol use: No    Alcohol/week: 0.0 oz  . Drug use: No    Review of Systems Constitutional: No fever/chills Eyes: No visual changes. ENT: No sore throat. No stiff neck no neck pain Cardiovascular: Denies chest pain. Respiratory: Denies shortness of breath. Gastrointestinal:   no vomiting.  No diarrhea.  No constipation. Genitourinary: Negative for dysuria.  Musculoskeletal: Negative lower extremity swelling Skin: Negative for rash. Neurological: Negative for severe headaches, focal weakness or numbness.   ____________________________________________   PHYSICAL EXAM:  VITAL SIGNS: ED Triage Vitals  Enc Vitals Group     BP 07/04/17 1350 (!) 158/75     Pulse Rate 07/04/17 1350 92     Resp 07/04/17 1350 16     Temp 07/04/17 1350 98.5 F (36.9 C)     Temp src --      SpO2 07/04/17 1350 99 %     Weight 07/04/17 1351 166 lb (75.3 kg)     Height 07/04/17 1351 5\' 5"  (1.651 m)     Head Circumference --      Peak Flow --      Pain Score --      Pain Loc --      Pain Edu? --      Excl. in [place]? --     Constitutional: Alert and oriented to name unsure of the date or where she has talks pleasantly with me about [facility] her history there. Eyes: Conjunctivae are normal Head: Atraumatic HEENT: No congestion/rhinnorhea. Mucous membranes are moist.  Oropharynx non-erythematous Neck:   Nontender with no meningismus, no masses, no stridor Cardiovascular: Normal rate, regular rhythm. Grossly normal heart sounds.  Good peripheral circulation. Respiratory: Normal respiratory effort.  No retractions. Lungs CTAB. Abdominal: Soft and nontender. No distention. No guarding no rebound Back:  There is no focal tenderness or step off.  there is no midline tenderness  there are no lesions noted. there is no CVA tenderness Musculoskeletal: No lower extremity tenderness, no upper extremity tenderness. No joint effusions, no DVT signs strong distal pulses no edema Neurologic:  Normal speech and language. No gross focal neurologic deficits are appreciated.  Skin:  Skin is warm, dry and intact. No rash noted. Psychiatric: Mood and affect are normal. Speech and behavior are normal.  ____________________________________________   LABS (all labs ordered are listed, but only abnormal results are displayed)  Labs Reviewed  CBC WITH DIFFERENTIAL/PLATELET - Abnormal; Notable for the following components:      Result Value   RDW 17.1 (*)    All other components within normal limits  COMPREHENSIVE METABOLIC PANEL - Abnormal; Notable for the following components:   Glucose, Bld 109 (*)    ALT 13 (*)    Total Bilirubin 0.2 (*)    All other components within normal limits  URINALYSIS, COMPLETE (UACMP) WITH MICROSCOPIC - Abnormal; Notable for the following components:   Color, Urine STRAW (*)    APPearance CLEAR (*)    Squamous Epithelial / LPF 0-5 (*)    All other components within normal limits    Pertinent labs  results that were available during my care of the patient were reviewed by me and considered in my medical decision making (see chart for details). ____________________________________________  EKG  I personally interpreted any EKGs ordered by me or triage  ____________________________________________  RADIOLOGY  Pertinent labs & imaging results that were available during my care of the patient were reviewed by me and considered in my medical decision making (see chart for details). If possible, patient and/or family made aware of any abnormal findings.  No results found. ____________________________________________    PROCEDURES  Procedure(s) performed: None  Procedures  Critical Care performed:  None  ____________________________________________   INITIAL IMPRESSION / ASSESSMENT AND PLAN / ED COURSE  Pertinent labs & imaging results that were available during my care of the patient  were reviewed by me and considered in my medical decision making (see chart for details).  Patient in no acute distress with pleasant dementia at her baseline per family, evaluation including basic blood work and urinalysis reassuring, patient does not L think require emergent CT scan of her head or further evaluation.  She has no complaints and has no issues at this time.  We will discuss with the nursing home and we have already discussed with family.  ----------------------------------------- 3:52 PM on 07/04/2017 -----------------------------------------  Discussed with Bubba Hales, of [facility], who states that they want to make sure this was the patient's baseline and wanted a urinalysis performed.  Apparently the patient has been wandering.  The patient herself is in no acute distress and eager to go back.    ____________________________________________   FINAL CLINICAL IMPRESSION(S) / ED DIAGNOSES  Final diagnoses:  None      This chart was dictated using voice recognition software.  Despite best efforts to proofread,  errors can occur which can change meaning.      Schuyler Amor, MD 07/04/17 1551    Schuyler Amor, MD 07/04/17 [id number]

## 2017-07-06 ENCOUNTER — Telehealth: Payer: Self-pay | Admitting: Family Medicine

## 2017-07-06 ENCOUNTER — Telehealth: Payer: Self-pay | Admitting: Hematology

## 2017-07-06 NOTE — Telephone Encounter (Signed)
Lattie Haw, RN from [employer] called to request information for a FL2, for [employer] [facility] [address] [place]. She needs the order for Lexapro and the discontinued med order for  Prozac to be sent to the facility. She is also requesting a couple of the last 2 office visit notes to be faxed to the facility. The fax # is [phone] and to attention Lattie Haw, RN.

## 2017-07-06 NOTE — Telephone Encounter (Signed)
Dr Raoul Pitch is out of the office today will address with her when she returns.

## 2017-07-07 ENCOUNTER — Telehealth: Payer: Self-pay | Admitting: Hematology

## 2017-07-07 ENCOUNTER — Emergency Department: Payer: Medicare Other

## 2017-07-07 ENCOUNTER — Emergency Department
Admission: EM | Admit: 2017-07-07 | Discharge: 2017-07-07 | Disposition: A | Discharge status: Skilled Nursing Facility | Payer: Medicare Other | Attending: Student in an Organized Health Care Education/Training Program | Admitting: Student in an Organized Health Care Education/Training Program

## 2017-07-07 DIAGNOSIS — E039 Hypothyroidism, unspecified: Secondary | ICD-10-CM | POA: Diagnosis not present

## 2017-07-07 DIAGNOSIS — R410 Disorientation, unspecified: Secondary | ICD-10-CM | POA: Insufficient documentation

## 2017-07-07 DIAGNOSIS — I252 Old myocardial infarction: Secondary | ICD-10-CM | POA: Insufficient documentation

## 2017-07-07 DIAGNOSIS — F0150 Vascular dementia without behavioral disturbance: Secondary | ICD-10-CM | POA: Insufficient documentation

## 2017-07-07 DIAGNOSIS — I110 Hypertensive heart disease with heart failure: Secondary | ICD-10-CM | POA: Diagnosis not present

## 2017-07-07 DIAGNOSIS — I2510 Atherosclerotic heart disease of native coronary artery without angina pectoris: Secondary | ICD-10-CM | POA: Diagnosis not present

## 2017-07-07 DIAGNOSIS — Z87891 Personal history of nicotine dependence: Secondary | ICD-10-CM | POA: Insufficient documentation

## 2017-07-07 DIAGNOSIS — Z8589 Personal history of malignant neoplasm of other organs and systems: Secondary | ICD-10-CM | POA: Insufficient documentation

## 2017-07-07 DIAGNOSIS — Z79899 Other long term (current) drug therapy: Secondary | ICD-10-CM | POA: Insufficient documentation

## 2017-07-07 DIAGNOSIS — Z951 Presence of aortocoronary bypass graft: Secondary | ICD-10-CM | POA: Diagnosis not present

## 2017-07-07 DIAGNOSIS — R4182 Altered mental status, unspecified: Secondary | ICD-10-CM | POA: Diagnosis present

## 2017-07-07 DIAGNOSIS — Z7982 Long term (current) use of aspirin: Secondary | ICD-10-CM | POA: Insufficient documentation

## 2017-07-07 DIAGNOSIS — F0390 Unspecified dementia without behavioral disturbance: Secondary | ICD-10-CM | POA: Insufficient documentation

## 2017-07-07 DIAGNOSIS — I509 Heart failure, unspecified: Secondary | ICD-10-CM | POA: Insufficient documentation

## 2017-07-07 LAB — BASIC METABOLIC PANEL
ANION GAP: 4 — AB (ref 5–15)
BUN: 14 mg/dL (ref 6–20)
CHLORIDE: 104 mmol/L (ref 101–111)
CO2: 30 mmol/L (ref 22–32)
CREATININE: 0.74 mg/dL (ref 0.44–1.00)
Calcium: 9.1 mg/dL (ref 8.9–10.3)
GFR calc non Af Amer: >60 mL/min (ref >60)
Glucose, Bld: 96 mg/dL (ref 65–99)
Potassium: 3.6 mmol/L (ref 3.5–5.1)
SODIUM: 138 mmol/L (ref 135–145)

## 2017-07-07 LAB — CBC WITH DIFFERENTIAL/PLATELET
BASOS ABS: 0.1 10*3/uL (ref 0–0.1)
BASOS PCT: 1 %
EOS ABS: 0.2 10*3/uL (ref 0–0.7)
Eosinophils Relative: 2 %
HEMATOCRIT: 39.4 % (ref 35.0–47.0)
HEMOGLOBIN: 12.6 g/dL (ref 12.0–16.0)
Lymphocytes Relative: 28 %
Lymphs Abs: 2.3 10*3/uL (ref 1.0–3.6)
MCH: 28.6 pg (ref 26.0–34.0)
MCHC: 32 g/dL (ref 32.0–36.0)
MCV: 89.3 fL (ref 80.0–100.0)
Monocytes Absolute: 0.7 10*3/uL (ref 0.2–0.9)
Monocytes Relative: 8 %
NEUTROS ABS: 5.1 10*3/uL (ref 1.4–6.5)
NEUTROS PCT: 61 %
Platelets: 315 10*3/uL (ref 150–440)
RBC: 4.41 MIL/uL (ref 3.80–5.20)
RDW: 16.7 % — ABNORMAL HIGH (ref 11.5–14.5)
WBC: 8.3 10*3/uL (ref 3.6–11.0)

## 2017-07-07 MED ORDER — TRAZODONE HCL 50 MG PO TABS: 50.0000 mg | ORAL_TABLET | Freq: Every day | ORAL | Status: DC

## 2017-07-07 MED ORDER — QUETIAPINE FUMARATE 50 MG PO TABS: 50.0000 mg | ORAL_TABLET | Freq: Every day | ORAL | 0 refills | Status: AC

## 2017-07-07 MED ORDER — TRAZODONE HCL 100 MG PO TABS: 100.0000 mg | ORAL_TABLET | ORAL | 0 refills | Status: DC | PRN

## 2017-07-07 MED ORDER — QUETIAPINE FUMARATE 25 MG PO TABS: 50.0000 mg | ORAL_TABLET | Freq: Every day | ORAL | Status: DC
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
## 2017-07-07 NOTE — Telephone Encounter (Signed)
Scheduled appt per 12/26 sch msg - spoke with patients daughter regarding appts.
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
## 2017-07-07 NOTE — ED Provider Notes (Signed)
[facility] Emergency Department Provider Note    First MD Initiated Contact with Patient 07/07/17 1906     (approximate)  I have reviewed the triage vital signs and the nursing notes.   HISTORY  Chief Complaint Altered Mental Status    HPI Rachel Conner is a 81 y.o. female with a history of vascular dementia presents from [facility] facility for evaluation of confusion and combativeness.  Also has complaints of hitting the back of her head.  Patient arrives via EMS pleasant, calm and well-appearing.  Has had frequent changes of environment over the past several weeks and patient does appear to be having evidence of sundowning.  Denies any complaints at this time.  No other recent medication changes.  No dysuria or fevers.  Past Medical History:  Diagnosis Date  . Anemia   . Anxiety   . Arthritis    Osteoarthrosis  . CAD (coronary artery disease) 1999   CABG  . Cancer [facility])    cancer of the appendix   . Cataract    rt eye per pt  . CHF (congestive heart failure) ([place])   . Colon cancer ([place]) 2017  . Colon polyps   . Confusion, postoperative   . DVT (deep venous thrombosis) ([place])   . GERD (gastroesophageal reflux disease)   . Hemorrhoids   . Hiatal hernia   . HLD (hyperlipidemia)   . HTN (hypertension)   . Hypothyroidism   . Major depressive disorder   . MI (myocardial infarction) ([place])   . Peripheral neuropathy   . Urinary incontinence    Family History  Problem Relation Age of Onset  . Heart disease Father   . Heart disease Mother   . Heart disease Brother   . Cancer Sister        non hodgkins lymphoma  . Heart disease Other        grandfather (side unknown)  . Colon cancer Neg Hx    Past Surgical History:  Procedure Laterality Date  . CAROTID STENT  07/1998   stent RCA   . CHOLECYSTECTOMY  2011  . COLON SURGERY  03/2015   partial colon to remove cancer in appendix  . COLONOSCOPY W/ BIOPSIES    . CORONARY ARTERY  BYPASS GRAFT  1999   6 vessel per pt.  . CORONARY STENT PLACEMENT  10/2001   circumflex stent  . ESOPHAGOGASTRODUODENOSCOPY    . LAPAROSCOPIC APPENDECTOMY N/A 04/11/2015   Procedure: LAPAROSCOPIC APPENDECTOMY;  Surgeon: Coralie Keens, MD;  Location: [place];  Service: General;  Laterality: N/A;  Laparoscopic converted to open appendectomy   . LUMBAR LAMINECTOMY     fusion 1971  . PARTIAL COLECTOMY N/A 04/28/2015   Procedure: Ileocecectomy;  Surgeon: Coralie Keens, MD;  Location: [place];  Service: General;  Laterality: N/A;  . Right Thumb Right    Thumb Finger Release  . THYROID SURGERY     Patient Active Problem List   Diagnosis Date Noted  . Acute encephalopathy 04/19/2017  . Hypokalemia 04/19/2017  . Prolonged QT interval 04/19/2017  . Sepsis ([place]) 04/18/2017  . Chronic prescription benzodiazepine use 02/03/2017  . Essential hypertension 11/03/2016  . Chronic seasonal allergic rhinitis due to pollen 10/13/2016  . Gastroesophageal reflux disease without esophagitis 10/13/2016  . Depression with anxiety 10/13/2016  . Mixed carcinoid-adenocarcinoma of colon ([place]) 04/28/2015  . Vascular dementia 11/07/2013  . Long term current use of anticoagulant therapy 04/03/2013  . DVT (deep venous thrombosis) ([place]) 03/06/2013  .  Hypothyroidism 05/20/2008  . HYPERCHOLESTEROLEMIA, MIXED 05/20/2008  . HYPERTENSIVE CARDIOVASCULAR DISEASE, BENIGN 05/20/2008  . CORONARY ATHEROSCLEROSIS, NATIVE VESSEL 05/20/2008  . ANEMIA 01/26/2008      Prior to Admission medications   Medication Sig Start Date End Date Taking? Authorizing Provider  acetaminophen (TYLENOL) 325 MG tablet Take 650 mg every 6 (six) hours as needed by mouth.    [provider]  aspirin EC 81 MG tablet Take 81 mg by mouth 3 (three) times a week.     [provider]  atorvastatin (LIPITOR) 40 MG tablet Take 1 tablet (40 mg total) by mouth daily at 6 PM. 04/22/17   Lavina Hamman, MD  dextroamphetamine (DEXEDRINE  SPANSULE) 5 MG 24 hr capsule Take 5 mg daily by mouth.    [provider]  famotidine (PEPCID) 20 MG tablet Take 1 tablet (20 mg total) by mouth daily. 04/22/17   Lavina Hamman, MD  feeding supplement, ENSURE ENLIVE, (ENSURE ENLIVE) LIQD Take 237 mLs by mouth 2 (two) times daily between meals. 04/22/17   Lavina Hamman, MD  levothyroxine (SYNTHROID, LEVOTHROID) 100 MCG tablet Take 1 tablet (100 mcg total) by mouth daily. 02/04/17   Kuneff, Renee A, DO  Melatonin 1 MG CAPS Take 3 mg daily by mouth.    [provider]  ondansetron (ZOFRAN) 4 MG tablet Take 4 mg every 8 (eight) hours as needed by mouth for nausea or vomiting.    [provider]  polyethylene glycol (MIRALAX / GLYCOLAX) packet Take 17 g daily as needed by mouth.    [provider]  potassium chloride (K-DUR) 10 MEQ tablet Take 1 tablet (10 mEq total) by mouth daily. 04/22/17   Lavina Hamman, MD  prochlorperazine (COMPAZINE) 5 MG tablet Take 1-2 tablets (5-10 mg total) every 6 (six) hours as needed by mouth for nausea or vomiting. 05/26/17   Truitt Merle, MD  QUEtiapine (SEROQUEL) 50 MG tablet Take 1 tablet (50 mg total) by mouth at bedtime. 07/07/17   Merlyn Lot, MD  sennosides-docusate sodium (SENOKOT-S) 8.6-50 MG tablet Take 1 tablet daily by mouth.    [provider]  traMADol (ULTRAM) 50 MG tablet Take 1 tablet (50 mg total) by mouth 3 (three) times daily as needed for moderate pain. 06/08/17   Truitt Merle, MD  traZODone (DESYREL) 100 MG tablet Take 1 tablet (100 mg total) by mouth as needed for up to 10 days. 07/07/17 07/17/17  Merlyn Lot, MD  XARELTO 15 MG TABS tablet TAKE 1 TABLET DAILY WITH SUPPER Patient taking differently: Take 1 tablet (15 mg) by mouth daily with supper 03/28/17   Josue Hector, MD    Allergies Niacin; Nitrogen; Nitroglycerin; Myrbetriq [mirabegron]; and Niaspan [niacin er]    Social History Social History   Tobacco Use  . Smoking status:  Former Smoker    Packs/day: 1.00    Years: 45.00    Pack years: 45.00    Last attempt to quit: 07/12/1997    Years since quitting: 20.0  . Smokeless tobacco: Never Used  . Tobacco comment: quit smoking age 86  Substance Use Topics  . Alcohol use: No    Alcohol/week: 0.0 oz  . Drug use: No    Review of Systems Patient denies headaches, rhinorrhea, blurry vision, numbness, shortness of breath, chest pain, edema, cough, abdominal pain, nausea, vomiting, diarrhea, dysuria, fevers, rashes or hallucinations unless otherwise stated above in HPI. ____________________________________________   PHYSICAL EXAM:  VITAL SIGNS: Vitals:   07/07/17  1930 07/07/17 2000  BP: (!) 154/85 (!) 189/83  Pulse:  86  Resp:  18  Temp:    SpO2:  98%    Constitutional: Alert and in no acute distress. Eyes: Conjunctivae are normal.  Head: Atraumatic. Nose: No congestion/rhinnorhea. Mouth/Throat: Mucous membranes are moist.   Neck: No stridor. Painless ROM.  Cardiovascular: Normal rate, regular rhythm. Grossly normal heart sounds.  Good peripheral circulation. Respiratory: Normal respiratory effort.  No retractions. Lungs CTAB. Gastrointestinal: Soft and nontender. No distention. No abdominal bruits. No CVA tenderness. Genitourinary:  Musculoskeletal: No lower extremity tenderness nor edema.  No joint effusions. Neurologic:  Normal speech and language. No gross focal neurologic deficits are appreciated. No facial droop Skin:  Skin is warm, dry and intact. No rash noted. Psychiatric: Mood and affect are normal. Speech and behavior are normal.  ____________________________________________   LABS (all labs ordered are listed, but only abnormal results are displayed)  Results for orders placed or performed during the hospital encounter of 07/07/17 (from the past 24 hour(s))  CBC with Differential/Platelet     Status: Abnormal   Collection Time: 07/07/17  7:49 PM  Result Value Ref Range   WBC 8.3 3.6  - 11.0 K/uL   RBC 4.41 3.80 - 5.20 MIL/uL   Hemoglobin 12.6 12.0 - 16.0 g/dL   HCT 39.4 35.0 - 47.0 %   MCV 89.3 80.0 - 100.0 fL   MCH 28.6 26.0 - 34.0 pg   MCHC 32.0 32.0 - 36.0 g/dL   RDW 16.7 (H) 11.5 - 14.5 %   Platelets 315 150 - 440 K/uL   Neutrophils Relative % 61 %   Neutro Abs 5.1 1.4 - 6.5 K/uL   Lymphocytes Relative 28 %   Lymphs Abs 2.3 1.0 - 3.6 K/uL   Monocytes Relative 8 %   Monocytes Absolute 0.7 0.2 - 0.9 K/uL   Eosinophils Relative 2 %   Eosinophils Absolute 0.2 0 - 0.7 K/uL   Basophils Relative 1 %   Basophils Absolute 0.1 0 - 0.1 K/uL  Basic metabolic panel     Status: Abnormal   Collection Time: 07/07/17  7:49 PM  Result Value Ref Range   Sodium 138 135 - 145 mmol/L   Potassium 3.6 3.5 - 5.1 mmol/L   Chloride 104 101 - 111 mmol/L   CO2 30 22 - 32 mmol/L   Glucose, Bld 96 65 - 99 mg/dL   BUN 14 6 - 20 mg/dL   Creatinine, Ser 0.74 0.44 - 1.00 mg/dL   Calcium 9.1 8.9 - 10.3 mg/dL   GFR calc non Af Amer >60 >60 mL/min   GFR calc Af Amer >60 >60 mL/min   Anion gap 4 (L) 5 - 15   ____________________________________________ ____________________________________________  RADIOLOGY  I personally reviewed all radiographic images ordered to evaluate for the above acute complaints and reviewed radiology reports and findings.  These findings were personally discussed with the patient.  Please see medical record for radiology report.  ____________________________________________   PROCEDURES  Procedure(s) performed:  Procedures    Critical Care performed: no ____________________________________________   INITIAL IMPRESSION / ASSESSMENT AND PLAN / ED COURSE  Pertinent labs & imaging results that were available during my care of the patient were reviewed by me and considered in my medical decision making (see chart for details).  DDX: Dehydration, sepsis, pna, uti, hypoglycemia, cva, drug effect, withdrawal, encephalitis   FATUMATA KASHANI is a 81  y.o. who presents to the ED with vascular dementia  presents with report of agitation, confusion and combativeness.  Patient arrives pleasant and in no acute distress.  Discussed case with daughter power of attorney.  Will get screening labs to evaluate for any change in electrolytes as well as CT head to evaluate for contusion given evidence of injury to the back of her head.  I do suspect this is most clinically consistent with sundowning and worsening of her underlying dementia due to frequent changes of environment.  CT imaging is reassuring as well as blood work is reassuring.  Patient does not have any evidence of withdrawal or sepsis.  Patient is pleasant calm and appropriate.  Have recommended Seroquel starting at a low dose nightly to help with agitation.  Patient is seeing her PCP tomorrow morning and they can further discuss changes to her medications.      ____________________________________________   FINAL CLINICAL IMPRESSION(S) / ED DIAGNOSES  Final diagnoses:  Confusion  Dementia without behavioral disturbance, unspecified dementia type      NEW MEDICATIONS STARTED DURING THIS VISIT:  This SmartLink is deprecated. Use AVSMEDLIST instead to display the medication list for a patient.   Note:  This document was prepared using Dragon voice recognition software and may include unintentional dictation errors.    Merlyn Lot, MD 07/07/17 2038

## 2017-07-08 NOTE — Telephone Encounter (Signed)
Please call Rachel Conner. I do not place orders for Rachel Conner. I have not seen this patient in a very long time and do not have verification of her current medication list. Furthermore she was seen in ED and medications changed. She  Needs to be seen in order for me to prescribed medications.

## 2017-07-08 NOTE — Telephone Encounter (Signed)
Copied from [place] [id number]. Topic: Quick Communication - See Telephone Encounter >> Jul 08, 2017 10:22 AM Boyd Kerbs wrote: CRM for notification. See Telephone encounter for:   Rachel Conner from [facility] [phone] asking for order to stop Prozac and to start Lexapro, this is according to her family.  Please clarify if not correct.   Rachel Conner will need order if changed.  She has called on 12/26 Please fax to : [phone]   She is also requesting latest copy of office notes. 07/08/17.

## 2017-07-08 NOTE — Telephone Encounter (Signed)
Left message on [facility] clinical coordinator Lisa's voice mail with information.

## 2017-07-14 ENCOUNTER — Emergency Department
Admission: EM | Admit: 2017-07-14 | Discharge: 2017-07-15 | Disposition: A | Discharge status: Home / Self Care | Payer: Medicare Other | Attending: Emergency Medicine | Admitting: Emergency Medicine

## 2017-07-14 ENCOUNTER — Encounter: Payer: Self-pay | Admitting: Emergency Medicine

## 2017-07-14 ENCOUNTER — Emergency Department: Payer: Medicare Other

## 2017-07-14 DIAGNOSIS — E039 Hypothyroidism, unspecified: Secondary | ICD-10-CM | POA: Diagnosis not present

## 2017-07-14 DIAGNOSIS — Z87891 Personal history of nicotine dependence: Secondary | ICD-10-CM | POA: Insufficient documentation

## 2017-07-14 DIAGNOSIS — I509 Heart failure, unspecified: Secondary | ICD-10-CM | POA: Insufficient documentation

## 2017-07-14 DIAGNOSIS — I252 Old myocardial infarction: Secondary | ICD-10-CM | POA: Insufficient documentation

## 2017-07-14 DIAGNOSIS — Z951 Presence of aortocoronary bypass graft: Secondary | ICD-10-CM | POA: Diagnosis not present

## 2017-07-14 DIAGNOSIS — I110 Hypertensive heart disease with heart failure: Secondary | ICD-10-CM | POA: Insufficient documentation

## 2017-07-14 DIAGNOSIS — R079 Chest pain, unspecified: Secondary | ICD-10-CM | POA: Insufficient documentation

## 2017-07-14 DIAGNOSIS — I2510 Atherosclerotic heart disease of native coronary artery without angina pectoris: Secondary | ICD-10-CM | POA: Diagnosis not present

## 2017-07-14 DIAGNOSIS — Z7901 Long term (current) use of anticoagulants: Secondary | ICD-10-CM | POA: Diagnosis not present

## 2017-07-14 DIAGNOSIS — Z79899 Other long term (current) drug therapy: Secondary | ICD-10-CM | POA: Diagnosis not present

## 2017-07-14 DIAGNOSIS — Z7982 Long term (current) use of aspirin: Secondary | ICD-10-CM | POA: Diagnosis not present

## 2017-07-14 DIAGNOSIS — Z85038 Personal history of other malignant neoplasm of large intestine: Secondary | ICD-10-CM | POA: Insufficient documentation

## 2017-07-14 DIAGNOSIS — F0390 Unspecified dementia without behavioral disturbance: Secondary | ICD-10-CM | POA: Insufficient documentation

## 2017-07-14 LAB — CBC
HEMATOCRIT: 39.9 % (ref 35.0–47.0)
Hemoglobin: 13.1 g/dL (ref 12.0–16.0)
MCH: 29.4 pg (ref 26.0–34.0)
MCHC: 33 g/dL (ref 32.0–36.0)
MCV: 89.1 fL (ref 80.0–100.0)
PLATELETS: 272 10*3/uL (ref 150–440)
RBC: 4.47 MIL/uL (ref 3.80–5.20)
RDW: 16.7 % — ABNORMAL HIGH (ref 11.5–14.5)
WBC: 6.9 10*3/uL (ref 3.6–11.0)

## 2017-07-14 LAB — BASIC METABOLIC PANEL
Anion gap: 7 (ref 5–15)
BUN: 12 mg/dL (ref 6–20)
CHLORIDE: 105 mmol/L (ref 101–111)
CO2: 27 mmol/L (ref 22–32)
Calcium: 9 mg/dL (ref 8.9–10.3)
Creatinine, Ser: 0.65 mg/dL (ref 0.44–1.00)
GFR calc non Af Amer: >60 mL/min (ref >60)
Glucose, Bld: 104 mg/dL — ABNORMAL HIGH (ref 65–99)
POTASSIUM: 3.7 mmol/L (ref 3.5–5.1)
SODIUM: 139 mmol/L (ref 135–145)

## 2017-07-14 LAB — TROPONIN I: Troponin I: <0.03 ng/mL (ref <0.03)

## 2017-07-14 NOTE — ED Notes (Signed)
Pt brought in from [facility] with a caregiver.  Pt has dementia.  Pt reports chest pain earlier today.  No chest pain or sob now.  No n/v/d.  No back pain.  Pt denies abd pian.  Iv in place.  Pt on monitor showing sinus at 70.  Pt awake and alert.

## 2017-07-14 NOTE — ED Triage Notes (Signed)
Patient comes into the ED via [facility] from home c/o chest pain pain and shortness of breath.  Patient has h/o MI and CABG in the past.  Patient states the chest pain is central in location.  Patient is not diaphoretic at this time and has a h/o dementia.  Patient denies any dizziness but states she is mildly nauseas and the pain radiates into the right arm.  Patient has even and unlabored respirations at this time.

## 2017-07-14 NOTE — ED Provider Notes (Signed)
[facility] Emergency Department Provider Note    ____________________________________________   I have reviewed the triage vital signs and the nursing notes.   HISTORY  Chief Complaint Chest Pain   History limited by: Dementia   HPI Rachel Conner is a 82 y.o. female who presents to the emergency department today because of concern for chest pain.   LOCATION:central DURATION:months TIMING: waxing and waning QUALITY: discomfort CONTEXT: patient with history of MI and CABG. States she has been having chest discomfort for months. Unclear what if anything changed about it today that made her decide to come in for it. MODIFYING FACTORS: none. ASSOCIATED SYMPTOMS: denies any shortness of breath. Denies any fevers.  Per medical record review patient has a history of dementia, CABG, MI.  Past Medical History:  Diagnosis Date  . Anemia   . Anxiety   . Arthritis    Osteoarthrosis  . CAD (coronary artery disease) 1999   CABG  . Cancer [facility])    cancer of the appendix   . Cataract    rt eye per pt  . CHF (congestive heart failure) ([place])   . Colon cancer ([place]) 2017  . Colon polyps   . Confusion, postoperative   . DVT (deep venous thrombosis) ([place])   . GERD (gastroesophageal reflux disease)   . Hemorrhoids   . Hiatal hernia   . HLD (hyperlipidemia)   . HTN (hypertension)   . Hypothyroidism   . Major depressive disorder   . MI (myocardial infarction) ([place])   . Peripheral neuropathy   . Urinary incontinence     Patient Active Problem List   Diagnosis Date Noted  . Acute encephalopathy 04/19/2017  . Hypokalemia 04/19/2017  . Prolonged QT interval 04/19/2017  . Sepsis ([place]) 04/18/2017  . Chronic prescription benzodiazepine use 02/03/2017  . Essential hypertension 11/03/2016  . Chronic seasonal allergic rhinitis due to pollen 10/13/2016  . Gastroesophageal reflux disease without esophagitis 10/13/2016  . Depression with anxiety 10/13/2016   . Mixed carcinoid-adenocarcinoma of colon ([place]) 04/28/2015  . Vascular dementia 11/07/2013  . Long term current use of anticoagulant therapy 04/03/2013  . DVT (deep venous thrombosis) ([place]) 03/06/2013  . Hypothyroidism 05/20/2008  . HYPERCHOLESTEROLEMIA, MIXED 05/20/2008  . HYPERTENSIVE CARDIOVASCULAR DISEASE, BENIGN 05/20/2008  . CORONARY ATHEROSCLEROSIS, NATIVE VESSEL 05/20/2008  . ANEMIA 01/26/2008    Past Surgical History:  Procedure Laterality Date  . CAROTID STENT  07/1998   stent RCA   . CHOLECYSTECTOMY  2011  . COLON SURGERY  03/2015   partial colon to remove cancer in appendix  . COLONOSCOPY W/ BIOPSIES    . CORONARY ARTERY BYPASS GRAFT  1999   6 vessel per pt.  . CORONARY STENT PLACEMENT  10/2001   circumflex stent  . ESOPHAGOGASTRODUODENOSCOPY    . LAPAROSCOPIC APPENDECTOMY N/A 04/11/2015   Procedure: LAPAROSCOPIC APPENDECTOMY;  Surgeon: Coralie Keens, MD;  Location: [facility];  Service: General;  Laterality: N/A;  Laparoscopic converted to open appendectomy   . LUMBAR LAMINECTOMY     fusion 1971  . PARTIAL COLECTOMY N/A 04/28/2015   Procedure: Ileocecectomy;  Surgeon: Coralie Keens, MD;  Location: [place] [facility];  Service: General;  Laterality: N/A;  . Right Thumb Right    Thumb Finger Release  . THYROID SURGERY      Prior to Admission medications   Medication Sig Start Date End Date Taking? Authorizing Provider  acetaminophen (TYLENOL) 325 MG tablet Take 650 mg every 6 (six) hours as needed by mouth.  [provider]  aspirin EC 81 MG tablet Take 81 mg by mouth 3 (three) times a week.     [provider]  atorvastatin (LIPITOR) 40 MG tablet Take 1 tablet (40 mg total) by mouth daily at 6 PM. 04/22/17   Lavina Hamman, MD  dextroamphetamine (DEXEDRINE SPANSULE) 5 MG 24 hr capsule Take 5 mg daily by mouth.    [provider]  famotidine (PEPCID) 20 MG tablet Take 1 tablet (20 mg total) by mouth daily. 04/22/17   Lavina Hamman, MD  feeding  supplement, ENSURE ENLIVE, (ENSURE ENLIVE) LIQD Take 237 mLs by mouth 2 (two) times daily between meals. 04/22/17   Lavina Hamman, MD  levothyroxine (SYNTHROID, LEVOTHROID) 100 MCG tablet Take 1 tablet (100 mcg total) by mouth daily. 02/04/17   Kuneff, Renee A, DO  Melatonin 1 MG CAPS Take 3 mg daily by mouth.    [provider]  ondansetron (ZOFRAN) 4 MG tablet Take 4 mg every 8 (eight) hours as needed by mouth for nausea or vomiting.    [provider]  polyethylene glycol (MIRALAX / GLYCOLAX) packet Take 17 g daily as needed by mouth.    [provider]  potassium chloride (K-DUR) 10 MEQ tablet Take 1 tablet (10 mEq total) by mouth daily. 04/22/17   Lavina Hamman, MD  prochlorperazine (COMPAZINE) 5 MG tablet Take 1-2 tablets (5-10 mg total) every 6 (six) hours as needed by mouth for nausea or vomiting. 05/26/17   Truitt Merle, MD  QUEtiapine (SEROQUEL) 50 MG tablet Take 1 tablet (50 mg total) by mouth at bedtime. 07/07/17   Merlyn Lot, MD  sennosides-docusate sodium (SENOKOT-S) 8.6-50 MG tablet Take 1 tablet daily by mouth.    [provider]  traMADol (ULTRAM) 50 MG tablet Take 1 tablet (50 mg total) by mouth 3 (three) times daily as needed for moderate pain. 06/08/17   Truitt Merle, MD  traZODone (DESYREL) 100 MG tablet Take 1 tablet (100 mg total) by mouth as needed for up to 10 days. 07/07/17 07/17/17  Merlyn Lot, MD  XARELTO 15 MG TABS tablet TAKE 1 TABLET DAILY WITH SUPPER Patient taking differently: Take 1 tablet (15 mg) by mouth daily with supper 03/28/17   Josue Hector, MD    Allergies Niacin; Nitrogen; Nitroglycerin; Myrbetriq [mirabegron]; and Niaspan [niacin er]  Family History  Problem Relation Age of Onset  . Heart disease Father   . Heart disease Mother   . Heart disease Brother   . Cancer Sister        non hodgkins lymphoma  . Heart disease Other        grandfather (side unknown)  . Colon cancer Neg Hx     Social  History Social History   Tobacco Use  . Smoking status: Former Smoker    Packs/day: 1.00    Years: 45.00    Pack years: 45.00    Last attempt to quit: 07/12/1997    Years since quitting: 20.0  . Smokeless tobacco: Never Used  . Tobacco comment: quit smoking age 10  Substance Use Topics  . Alcohol use: No    Alcohol/week: 0.0 oz  . Drug use: No    Review of Systems Constitutional: No fever/chills Eyes: No visual changes. ENT: No sore throat. Cardiovascular: Positive for chest pain. Respiratory: Denies shortness of breath. Gastrointestinal: No abdominal pain.  No nausea, no vomiting.  No diarrhea.   Genitourinary: Negative for dysuria. Musculoskeletal: Negative for back pain.  Skin: Negative for rash. Neurological: Negative for headaches, focal weakness or numbness.  ____________________________________________   PHYSICAL EXAM:  VITAL SIGNS: ED Triage Vitals  Enc Vitals Group     BP 07/14/17 1933 118/66     Pulse Rate 07/14/17 1933 89     Resp 07/14/17 1933 18     Temp 07/14/17 1933 98.2 F (36.8 C)     Temp Source 07/14/17 1933 Oral     SpO2 07/14/17 1933 97 %     Weight 07/14/17 1927 170 lb (77.1 kg)     Height 07/14/17 1927 5\' 5"  (1.651 m)     Head Circumference --      Peak Flow --      Pain Score 07/14/17 1927 7    Constitutional: Alert, not completely oriented. Well appearing and in no distress. Eyes: Conjunctivae are normal.  ENT   Head: Normocephalic and atraumatic.   Nose: No congestion/rhinnorhea.   Mouth/Throat: Mucous membranes are moist.   Neck: No stridor. Hematological/Lymphatic/Immunilogical: No cervical lymphadenopathy. Cardiovascular: Normal rate, regular rhythm.  No murmurs, rubs, or gallops. Respiratory: Normal respiratory effort without tachypnea nor retractions. Breath sounds are clear and equal bilaterally. No wheezes/rales/rhonchi. Gastrointestinal: Soft and non tender. No rebound. No guarding.  Genitourinary:  Deferred Musculoskeletal: Normal range of motion in all extremities. No lower extremity edema. Neurologic:  Normal speech and language. No gross focal neurologic deficits are appreciated.  Skin:  Skin is warm, dry and intact. No rash noted. Psychiatric: Mood and affect are normal. Speech and behavior are normal. Patient exhibits appropriate insight and judgment.  ____________________________________________    LABS (pertinent positives/negatives)  Trop <0.03 x 2 CBC wnl except RDW 16.7 BMP wnl except 104  ____________________________________________   EKG  I, Nance Pear, attending physician, personally viewed and interpreted this EKG  EKG Time: 1927 Rate: 83 Rhythm: normal sinus rhythm Axis: normal Intervals: qtc 439 QRS: narrow, q waves V1, V2, V3 ST changes: no st elevation Impression: abnormal ekg  ____________________________________________    RADIOLOGY  CXR No acute disease  ____________________________________________   PROCEDURES  Procedures  ____________________________________________   INITIAL IMPRESSION / ASSESSMENT AND PLAN / ED COURSE  Pertinent labs & imaging results that were available during my care of the patient were reviewed by me and considered in my medical decision making (see chart for details).  Patient with history of dementia who presents because of concern for chest pain. ddx includes pna, ptx, cardiac etiology, chest wall pain, esophagitis amongst other etiologies. Work up without concerning findings including 2 negative troponins. Patient appears comfortable on exam. Will discharge to follow up with PCP. Discussed with patient and companion.  ____________________________________________   FINAL CLINICAL IMPRESSION(S) / ED DIAGNOSES  Final diagnoses:  Nonspecific chest pain     Note: This dictation was prepared with Dragon dictation. Any transcriptional errors that result from this process are unintentional      Nance Pear, MD 07/15/17 [phone]

## 2017-07-14 NOTE — ED Notes (Signed)
Caregiver is with pt in lobby

## 2017-07-14 NOTE — ED Notes (Signed)
Report off to ashley rn  

## 2017-07-15 ENCOUNTER — Other Ambulatory Visit: Payer: Medicare Other

## 2017-07-15 ENCOUNTER — Ambulatory Visit: Payer: Medicare Other | Admitting: Hematology

## 2017-07-15 DIAGNOSIS — R079 Chest pain, unspecified: Secondary | ICD-10-CM | POA: Diagnosis not present

## 2017-07-15 LAB — TROPONIN I: Troponin I: <0.03 ng/mL (ref <0.03)

## 2017-07-15 NOTE — ED Notes (Signed)
Unable to get in touch with [facility] in regards to transportation back to facility.  According to caregiver who is sitting with pt, she does not have permission per facility to transport pt in her private vehicle.  Caregiver states pt has always gone back via EMS in past.  Will have secretary arrange for EMS transportation at this time.

## 2017-07-15 NOTE — Discharge Instructions (Signed)
Please seek medical attention for any high fevers, chest pain, shortness of breath, change in behavior, persistent vomiting, bloody stool or any other new or concerning symptoms.  

## 2017-07-21 ENCOUNTER — Ambulatory Visit (HOSPITAL_COMMUNITY)
Admission: RE | Admit: 2017-07-21 | Discharge: 2017-07-21 | Disposition: A | Discharge status: Home / Self Care | Payer: Medicare Other | Source: Ambulatory Visit | Attending: Hematology | Admitting: Hematology

## 2017-07-21 DIAGNOSIS — J439 Emphysema, unspecified: Secondary | ICD-10-CM | POA: Diagnosis not present

## 2017-07-21 DIAGNOSIS — C189 Malignant neoplasm of colon, unspecified: Secondary | ICD-10-CM | POA: Insufficient documentation

## 2017-07-21 DIAGNOSIS — I700 Atherosclerosis of aorta: Secondary | ICD-10-CM | POA: Diagnosis not present

## 2017-07-21 LAB — GLUCOSE, CAPILLARY: Glucose-Capillary: 109 mg/dL — ABNORMAL HIGH (ref 65–99)

## 2017-07-21 MED ORDER — FLUDEOXYGLUCOSE F - 18 (FDG) INJECTION: 8.2000 | Freq: Once | INTRAVENOUS | Status: DC | PRN

## 2017-07-22 ENCOUNTER — Inpatient Hospital Stay: Payer: Medicare Other | Attending: Hematology | Admitting: Hematology

## 2017-07-22 ENCOUNTER — Encounter: Payer: Self-pay | Admitting: Hematology

## 2017-07-22 ENCOUNTER — Other Ambulatory Visit: Payer: Medicare Other

## 2017-07-22 ENCOUNTER — Telehealth: Payer: Self-pay | Admitting: *Deleted

## 2017-07-22 DIAGNOSIS — R109 Unspecified abdominal pain: Secondary | ICD-10-CM

## 2017-07-22 DIAGNOSIS — C189 Malignant neoplasm of colon, unspecified: Secondary | ICD-10-CM

## 2017-07-22 DIAGNOSIS — R140 Abdominal distension (gaseous): Secondary | ICD-10-CM | POA: Diagnosis not present

## 2017-07-22 DIAGNOSIS — R630 Anorexia: Secondary | ICD-10-CM

## 2017-07-22 DIAGNOSIS — I110 Hypertensive heart disease with heart failure: Secondary | ICD-10-CM | POA: Diagnosis not present

## 2017-07-22 DIAGNOSIS — I509 Heart failure, unspecified: Secondary | ICD-10-CM | POA: Diagnosis not present

## 2017-07-22 DIAGNOSIS — F329 Major depressive disorder, single episode, unspecified: Secondary | ICD-10-CM

## 2017-07-22 DIAGNOSIS — D500 Iron deficiency anemia secondary to blood loss (chronic): Secondary | ICD-10-CM

## 2017-07-22 DIAGNOSIS — F0390 Unspecified dementia without behavioral disturbance: Secondary | ICD-10-CM

## 2017-07-22 DIAGNOSIS — C786 Secondary malignant neoplasm of retroperitoneum and peritoneum: Secondary | ICD-10-CM

## 2017-07-22 DIAGNOSIS — E039 Hypothyroidism, unspecified: Secondary | ICD-10-CM

## 2017-07-22 DIAGNOSIS — R110 Nausea: Secondary | ICD-10-CM

## 2017-07-22 DIAGNOSIS — Z7189 Other specified counseling: Secondary | ICD-10-CM

## 2017-07-22 DIAGNOSIS — C184 Malignant neoplasm of transverse colon: Secondary | ICD-10-CM | POA: Diagnosis not present

## 2017-07-22 NOTE — Progress Notes (Signed)
[place]  [phone] [phone]  Clinic Follow Up Note   Patient Care Team: Ma Hillock, DO as PCP - General (Family Medicine) Coralie Keens, MD as Consulting Physician (General Surgery) Gatha Mayer, MD as Consulting Physician (Gastroenterology) Josue Hector, MD as Consulting Physician (Cardiology) Tania Ade, RN as Registered Nurse Truitt Merle, MD as Consulting Physician (Hematology) 07/22/2017   CHIEF COMPLAINTS:  Follow up mixed carcinoids-adenocarcinoma of appendix  HISTORY OF PRESENTING ILLNESS (05/29/2015):  Rachel Conner 82 y.o. 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 She is accompanied by her son to the clinic today. She came in a wheelchair.  She presented with abdominal pain, intermittent nausea and vomiting, diarrhea, for a few months. She was seen at [facility] emergency room in September, and CT of abdomen and pelvis revealed dilated appendix. She was referred to general surgeon Dr. Ninfa Linden, who brought her to OR on 04/11/2015 for appendectomy. She tolerated surgery well. The surgical path reviewed mixed carcinoid and adenocarcinoma, with area of small perforation. She was brought back to the OR on 04/28/2015, and underwent right hemicolectomy.  She has been recovering well from her surgery. She still feels quite fatigued, she is able to do most ADLs, but not much other activities. She has physical therapist and occupational therapist coming to her house twice a week. Her appetite has improved, she is eating better. Her pain incision has been much improved.  CURRENT THERAPY:  surveillance  INTERIM HISTORY  Rachel Conner is scheduled to see me today, but he was not able to make it, due to her fatigue. She did not sleep much last night and was sleeping this morning. Her daughter presented to my clinic today.  Patient went to [place] aft her last visit with me in November  2018, and became confused, combative soon after she arrived [place].  She was admitted to local hospital for 2 weeks, if felt was to be related to her dementia.  She came back to the [place] about 3-4 weeks ago, and was moved to assisted living.  She has a full-time caregiver at home.  Her memory has been getting worse, intermittently confused, and agitated, wondering around.  She was evaluated by a psychiatrist.  Per daughter, she also has intermittent abdominal pain, she is taking tramadol, her pain was controlled most the time, except one episode of severe pain last night.  Her appetite is also low, she eats little, she has lost some weight lately.   MEDICAL HISTORY:  Past Medical History:  Diagnosis Date  . Anemia   . Anxiety   . Arthritis    Osteoarthrosis  . CAD (coronary artery disease) 1999   CABG  . Cancer [facility])    cancer of the appendix   . Cataract    rt eye per pt  . CHF (congestive heart failure) ([place])   . Colon cancer ([place]) 2017  . Colon polyps   . Confusion, postoperative   . DVT (deep venous thrombosis) ([place])   . GERD (gastroesophageal reflux disease)   . Hemorrhoids   . Hiatal hernia   . HLD (hyperlipidemia)   . HTN (hypertension)   . Hypothyroidism   . Major depressive disorder   . MI (myocardial infarction) ([place])   . Peripheral neuropathy   . Urinary incontinence     SURGICAL HISTORY: Past Surgical History:  Procedure Laterality Date  . CAROTID STENT  07/1998   stent RCA   .  CHOLECYSTECTOMY  2011  . COLON SURGERY  03/2015   partial colon to remove cancer in appendix  . COLONOSCOPY W/ BIOPSIES    . CORONARY ARTERY BYPASS GRAFT  1999   6 vessel per pt.  . CORONARY STENT PLACEMENT  10/2001   circumflex stent  . ESOPHAGOGASTRODUODENOSCOPY    . LAPAROSCOPIC APPENDECTOMY N/A 04/11/2015   Procedure: LAPAROSCOPIC APPENDECTOMY;  Surgeon: Coralie Keens, MD;  Location: [place];  Service: General;  Laterality: N/A;  Laparoscopic converted to open appendectomy   .  LUMBAR LAMINECTOMY     fusion 1971  . PARTIAL COLECTOMY N/A 04/28/2015   Procedure: Ileocecectomy;  Surgeon: Coralie Keens, MD;  Location: [place];  Service: General;  Laterality: N/A;  . Right Thumb Right    Thumb Finger Release  . THYROID SURGERY      SOCIAL HISTORY: Social History   Socioeconomic History  . Marital status: Widowed    Spouse name: Not on file  . Number of children: 3  . Years of education: 23  . Highest education level: Not on file  Social Needs  . Financial resource strain: Not on file  . Food insecurity - worry: Not on file  . Food insecurity - inability: Not on file  . Transportation needs - medical: Not on file  . Transportation needs - non-medical: Not on file  Occupational History  . Occupation: retired EMT  Tobacco Use  . Smoking status: Former Smoker    Packs/day: 1.00    Years: 45.00    Pack years: 45.00    Last attempt to quit: 07/12/1997    Years since quitting: 20.0  . Smokeless tobacco: Never Used  . Tobacco comment: quit smoking age 32  Substance and Sexual Activity  . Alcohol use: No    Alcohol/week: 0.0 oz  . Drug use: No  . Sexual activity: No  Other Topics Concern  . Not on file  Social History Narrative   Widowed, lives alone, son and DIL live close.    Retired from being EMT/med tech   Originally from [place]   Able to perform ADL's independently   [place] a daily vitamin   Wears her seatbelt   Wears dentures   Smoke detector in the home.   Feels safe in her relationships    FAMILY HISTORY: Family History  Problem Relation Age of Onset  . Heart disease Father   . Heart disease Mother   . Heart disease Brother   . Cancer Sister        non hodgkins lymphoma  . Heart disease Other        grandfather (side unknown)  . Colon cancer Neg Hx     ALLERGIES:  is allergic to niacin; nitrogen; nitroglycerin; myrbetriq [mirabegron]; and niaspan [niacin er].  MEDICATIONS:  Current Outpatient Medications  Medication Sig  Dispense Refill  . acetaminophen (TYLENOL) 325 MG tablet Take 650 mg every 6 (six) hours as needed by mouth.    Marland Kitchen aspirin EC 81 MG tablet Take 81 mg by mouth 3 (three) times a week.     Marland Kitchen atorvastatin (LIPITOR) 40 MG tablet Take 1 tablet (40 mg total) by mouth daily at 6 PM. 30 tablet 0  . dextroamphetamine (DEXEDRINE SPANSULE) 5 MG 24 hr capsule Take 5 mg daily by mouth.    . famotidine (PEPCID) 20 MG tablet Take 1 tablet (20 mg total) by mouth daily. 30 tablet 0  . feeding supplement, ENSURE ENLIVE, (ENSURE ENLIVE) LIQD Take 237 mLs by  mouth 2 (two) times daily between meals. 237 mL 12  . levothyroxine (SYNTHROID, LEVOTHROID) 100 MCG tablet Take 1 tablet (100 mcg total) by mouth daily. 90 tablet 3  . Melatonin 1 MG CAPS Take 3 mg daily by mouth.    . ondansetron (ZOFRAN) 4 MG tablet Take 4 mg every 8 (eight) hours as needed by mouth for nausea or vomiting.    . polyethylene glycol (MIRALAX / GLYCOLAX) packet Take 17 g daily as needed by mouth.    . potassium chloride (K-DUR) 10 MEQ tablet Take 1 tablet (10 mEq total) by mouth daily. 10 tablet 0  . prochlorperazine (COMPAZINE) 5 MG tablet Take 1-2 tablets (5-10 mg total) every 6 (six) hours as needed by mouth for nausea or vomiting. 30 tablet 2  . QUEtiapine (SEROQUEL) 50 MG tablet Take 1 tablet (50 mg total) by mouth at bedtime. 10 tablet 0  . sennosides-docusate sodium (SENOKOT-S) 8.6-50 MG tablet Take 1 tablet daily by mouth.    . traMADol (ULTRAM) 50 MG tablet Take 1 tablet (50 mg total) by mouth 3 (three) times daily as needed for moderate pain. 60 tablet 0  . traZODone (DESYREL) 100 MG tablet Take 1 tablet (100 mg total) by mouth as needed for up to 10 days. 20 tablet 0  . XARELTO 15 MG TABS tablet TAKE 1 TABLET DAILY WITH SUPPER (Patient taking differently: Take 1 tablet (15 mg) by mouth daily with supper) 90 tablet 1   No current facility-administered medications for this visit.    Facility-Administered Medications Ordered in Other  Visits  Medication Dose Route Frequency Provider Last Rate Last Dose  . fludeoxyglucose F - 18 (FDG) injection 8.2 millicurie  8.2 millicurie Intravenous Once PRN Rolm Baptise, MD        REVIEW OF SYSTEMS:   Constitutional: Denies fevers, chills or abnormal night sweats (+) weight loss  Eyes: Denies blurriness of vision, double vision or watery eyes Ears, nose, mouth, throat, and face: Denies mucositis or sore throat Respiratory: Denies cough, dyspnea or wheezes Cardiovascular: Denies palpitation, chest discomfort or lower extremity swelling Gastrointestinal:  Denies heartburn, (+) nausea, (+) bloating, (+) constipation/diarrhea (+) abdominal pain Skin: Denies abnormal skin rashes Lymphatics: Denies new lymphadenopathy or easy bruising Neurological: Denies numbness, tingling or new weaknesses Behavioral/Psych: Mood is stable, no new changes  All other systems were reviewed with the patient and are negative.  PHYSICAL EXAMINATION: ECOG PERFORMANCE STATUS: 2-3  There were no vitals filed for this visit. There were no vitals filed for this visit.  GENERAL:alert, no distress and comfortable SKIN: skin color, texture, turgor are normal, no rashes or significant lesions EYES: normal, conjunctiva are pink and non-injected, sclera clear OROPHARYNX:no exudate, no erythema and lips, buccal mucosa, and tongue normal  NECK: supple, thyroid normal size, non-tender, without nodularity LYMPH:  no palpable lymphadenopathy in the cervical, axillary or inguinal LUNGS: clear to auscultation and percussion with normal breathing effort HEART: regular rate & rhythm and no murmurs and no lower extremity edema ABDOMEN:abdomen soft, non-tender. No organomegaly. (+) surgical incision sites are healing well, (+) sluggish bowels Musculoskeletal:no cyanosis of digits and no clubbing  PSYCH: alert & oriented x 3 with fluent speech NEURO: no focal motor/sensory deficits  LABORATORY DATA:  I have reviewed the  data as listed CBC Latest Ref Rng & Units 07/14/2017 07/07/2017 07/04/2017  WBC 3.6 - 11.0 K/uL 6.9 8.3 8.4  Hemoglobin 12.0 - 16.0 g/dL 13.1 12.6 13.3  Hematocrit 35.0 - 47.0 % 39.9 39.4 41.3  Platelets 150 - 440 K/uL 272 315 339    CMP Latest Ref Rng & Units 07/14/2017 07/07/2017 07/04/2017  Glucose 65 - 99 mg/dL 104(H) 96 109(H)  BUN 6 - 20 mg/dL 12 14 9   Creatinine 0.44 - 1.00 mg/dL 0.65 0.74 0.74  Sodium 135 - 145 mmol/L 139 138 138  Potassium 3.5 - 5.1 mmol/L 3.7 3.6 3.5  Chloride 101 - 111 mmol/L 105 104 104  CO2 22 - 32 mmol/L 27 30 27   Calcium 8.9 - 10.3 mg/dL 9.0 9.1 9.3  Total Protein 6.5 - 8.1 g/dL - - 7.6  Total Bilirubin 0.3 - 1.2 mg/dL - - 0.2(L)  Alkaline Phos 38 - 126 U/L - - 47  AST 15 - 41 U/L - - 22  ALT 14 - 54 U/L - - 13(L)   Chromogranin A (0-5 nmol/L) 04/16/2016: 9 08/02/2016: 2 05/19/17: 3  24-urine 5 HIAA (0-15mg /24h) 04/16/2016: 2.2   CEA 04/16/2016: 3.8 08/02/2016: 3.6 11/04/2016: 3.08 05/19/17: 5.5   Results for AZARRIA, BALINT (MRN [id number]) as of 04/23/2016 06:49  Ref. Range 06/19/2015 09:30 03/11/2016 15:26 04/16/2016 11:30  Iron Latest Ref Range: 41 - 142 ug/dL 23 (L)  43  UIBC Latest Ref Range: 120 - 384 ug/dL 345  297  TIBC Latest Ref Range: 236 - 444 ug/dL 368  340  %SAT Latest Ref Range: 21 - 57 % 6 (L)  13 (L)  Ferritin Latest Ref Range: 9 - 269 ng/ml 20 8.6 (L) 22    PATHOLOGY REPORT  Diagnosis 04/28/2015 Colon, segmental resection for tumor, Distal illium and cecum - MIXED GOBLET CELL CARCINOID/CARCINOMA-ADENOCARCINOMA, SEE COMMENT. - TUMOR INVOLVES APPENDICEAL OS. - TUMOR INVOLVES FIBROFATTY SOFT TISSUE. AROUND THE APPENDICEAL OS - ONE LYMPH NODE, POSITIVE FOR METASTATIC TUMOR (1/5). - SURGICAL MARGINS, NEGATIVE FOR TUMOR. Microscopic Comment The previous appendectomy demonstrating mixed goblet cell carcinoid/carcinoma-adenocarcinoma is noted ([id number]). Please see previous case for additional tumor information. ([email or web address]:ecj  04/29/2015)  Diagnosis 04/11/2015 Appendix, Other than Incidental - MIXED GOBLET CELL CARCINOID-ADENOCARCINOMA, GRADE 3, SPANNING 4.5 CM. - TUMOR INVADES THROUGH SEROSA. - PERINEURAL INVASION PRESENT. - RESECTION MARGIN IS POSITIVE. - SEE ONCOLOGY TABLE. Microscopic Comment APPENDIX: Specimen: Appendix. Procedure: Appendectomy. Specimen Integrity: Disrupted.  Specimen Size: 4.5 x 3.0 x 1.9 cm. Tumor Site: Diffuse involving appendix. Tumor Size: Approximately 4.5 cm. Histologic Type: Mixed goblet cell carcinoma-adenocarcinoma. Histologic Grade: Grade 3 (poorly differentiated) Microscopic Tumor Extension: Through serosa. Margins: Proximal Margin: Involved. Mesenteric Margin: N/A. Adenoma present at proximal margin: No. Lymph-Vascular Invasion: Not identified. Perineural Invasion: Present. Peritumoral Nodules (tumor deposits): Not identified. Lymph nodes: number examined 0; number positive: 0 TNM: pT4, pNX Ancillary studies: Can be performed upon request. Comments: The tumor consists of infiltrating nests and single goblet cells. Additionally there are areas of conventional gland forming adenocarcinoma. Immunohistochemistry reveals the goblet cells are positive for synaptophysin, chromogranin, and CD56. The tumor diffusely involves the appendix from the tip to the resection margin and is thus estimated at 4.5 cm in size. There is an area of possible perforation, although this is difficult to assess with the disrupted nature of the specimen. Tumor invades through the serosa. Dr. Donato Heinz has reviewed the case. The case was called to Dr. Ninfa Linden on 04/15/2015.   RADIOGRAPHIC STUDIES: I have personally reviewed the radiological images as listed and agreed with the findings in the report. Dg Chest 2 View  Result Date: 07/14/2017 CLINICAL DATA:  Acute chest pain and shortness of breath. EXAM: CHEST  2 VIEW COMPARISON:  04/18/2017  and prior radiographs FINDINGS: Cardiomediastinal  silhouette is unchanged with evidence of prior CABG. There is no evidence of focal airspace disease, pulmonary edema, suspicious pulmonary nodule/mass, pleural effusion, or pneumothorax. No acute bony abnormalities are identified. IMPRESSION: No active cardiopulmonary disease. Electronically Signed   By: Margarette Canada M.D.   On: 07/14/2017 20:04   Ct Head Wo Contrast  Result Date: 07/07/2017 CLINICAL DATA:  Confusion.  Recent fall EXAM: CT HEAD WITHOUT CONTRAST TECHNIQUE: Contiguous axial images were obtained from the base of the skull through the vertex without intravenous contrast. COMPARISON:  Head CT April 18, 2017 and brain MRI April 19, 2017 FINDINGS: Brain: There is moderate diffuse atrophy. There is no intracranial mass, hemorrhage, extra-axial fluid collection, or midline shift. There is small vessel disease throughout the centra semiovale bilaterally. There is small vessel disease in each internal and external auditory canal as well as in each thalamus. There is no acute appearing infarct. Vascular: No hyperdense vessel. There is calcification in each carotid siphon region an in the distal left vertebral artery. Skull: The bony calvarium appears intact. Sinuses/Orbits: There is opacification in left-sided ethmoid air cells. Visualized paranasal sinuses otherwise are clear. Visualized orbits appear symmetric bilaterally. Other: Mastoid air cells are clear. IMPRESSION: Atrophy with supratentorial small vessel disease, stable. No acute infarct evident. No mass or hemorrhage. There are foci of arterial vascular calcification. There are foci of ethmoid sinus disease. Comment: There is a probable small sebaceous cyst in the scalp overlying the medial left parietal region. Electronically Signed   By: Lowella Grip III M.D.   On: 07/07/2017 19:33   Nm Pet Image Initial (pi) Skull Base To Thigh  Result Date: 07/21/2017 CLINICAL DATA:  Subsequent treatment strategy for mixed carcinoid adenocarcinoma of  the appendix diagnosed September 2016 with subsequent right hemicolectomy 04/28/2015. Patient presents for restaging of peritoneal nodularity seen on prior CT abdomen study. EXAM: NUCLEAR MEDICINE PET SKULL BASE TO THIGH TECHNIQUE: 8.2 mCi F-18 FDG was injected intravenously. Full-ring PET imaging was performed from the skull base to thigh after the radiotracer. CT data was obtained and used for attenuation correction and anatomic localization. Mediastinal Blood Pool Activity (max SUV): 3.2 FASTING BLOOD GLUCOSE:  Value: 109 mg/dl COMPARISON:  06/06/2017 CT abdomen/pelvis. 12/11/2015 CT chest, abdomen and pelvis. FINDINGS: NECK: No hypermetabolic lymph nodes in the neck. CHEST: No hypermetabolic axillary, mediastinal or hilar lymph nodes. No acute consolidative airspace disease or lung masses. Anterior left upper lobe 3 mm solid pulmonary nodule (series 8/image 35) is stable since 06/12/2015 chest CT and considered benign. No new significant pulmonary nodules. Stable coarse granulomatous calcifications in left hilum. Three-vessel coronary atherosclerosis status post CABG. Atherosclerotic nonaneurysmal thoracic aorta. No pneumothorax. No pleural effusions. Mild centrilobular emphysema. Intact sternotomy wires. ABDOMEN/PELVIS: Stable postsurgical changes from subtotal right hemicolectomy with intact appearing ileocolic anastomosis in the right abdomen. No discrete mass or wall thickening at the ileocolic anastomosis. Scattered soft tissue nodularity throughout the omentum, most prominent in the left lower omentum, stable to minimally increased since 06/06/2017. There is low level metabolism associated with these tiny nodules with a representative 0.9 cm left omental nodule with max SUV 2.3 (series 4/image 142), previously 0.8 cm, minimally increased in size. No abnormal hypermetabolic activity within the liver, pancreas, adrenal glands, or spleen. No hypermetabolic lymph nodes in the abdomen or pelvis.  Cholecystectomy. Atherosclerotic nonaneurysmal abdominal aorta. SKELETON: No focal hypermetabolic activity to suggest skeletal metastasis. IMPRESSION: 1. Scattered omental soft tissue nodularity, stable to minimally increased since 06/06/2017  CT study, with associated low level metabolism (max SUV 2.3), considered significant for nodules of this small size, worrisome for peritoneal carcinomatosis. 2. No additional hypermetabolic sites of metastatic disease. No evidence of hypermetabolic local recurrence at the ileocolic anastomosis in the right abdomen. 3. Aortic Atherosclerosis (ICD10-I70.0) and Emphysema (ICD10-J43.9). Electronically Signed   By: Ilona Sorrel M.D.   On: 07/21/2017 09:12    ASSESSMENT & PLAN:  82 y.o. Caucasian female with:  1. Mixed goblet cell carcinoid-adenocarcinoma of appendix, pT4aN1M0, stage IIIB -I previously reviewed her surgical pathology results with her in great details. The stage was also discussed with her. -we previously discussed that appendiceal mixed carcinoid and adenocarcinoma is rare type tumor, and we do not have large body of clinical trial data to guild [place] about the adjuvant chemotherapy and surveillance. -due to the component of adenocarcinoma, and advanced stage, high risk features including a T4 disease, positive nodes, and possible appendiceal perforation, I do think her risk of recurrence is high. -Patient previously declined adjuvant chemotherapy.  -She is clinically doing well, exam unremarkable. I reviewed her lab test results from last week, which showed normal CBC, CMP, CEA, chromogranin A and a 24-hour urine HIAA.  -Her screening colonoscopy in 02/2016 was negative -No clinical concern for recurrence. - Reviewed CT scan results from 11/04/16 with the patient and her family; no evidence of recurrent or metastatic disease.  -Continue surveillance, for total 5 years. -She has developed intermittent abdominal pain, bloating, decreased appetite since  November 2018. -I reviewed her restaging PET scan from yesterday with her daughter, which showed peritoneal soft tissues with some mild FDG uptake, concerning for peritoneal metastasis. -Due to her worsening dementia, she will not be a candidate for anticancer treatment.  At this point, I do not feel biopsy to confirm the recurrence is necessary. -I recommend hospice, and supportive care.  Patient's daughter is her power of attorney, and is in agreement.  I will make a referral to [place] today.  2. Goal of care discussion  -We discussed the incurable nature of her cancer, due to the probable peritoneal metastasis, and the overall poor prognosis.  Due to her underlying dementia and overall poor condition, she is not a candidate for chemotherapy. -The patient's daughter Rachel Conner understands the goal of care is palliative. -I recommend DNR/DNI, Rachel Conner agrees.   2. Iron deficient anemia secondary to blood loss  3. HTN, hypothyroidism, CHF 4. Depression and worsening dementaia  5. Anorexia and nausea  6. Abdominal Bloating and pain    Plan -I have discussed patient's PET scan findings in details with patient's daughter, who is her power of attorney today.  This is highly suspicious for peritoneal cancer metastasis. -She is not a candidate for chemotherapy given her worsening dementia and functional status, I recommend hospice.  Her daughter Rachel Conner is in agreement.  We will make a referral today.   All questions were answered. The patient knows to call the clinic with any problems, questions or concerns.  I spent 30 minutes counseling the patient's daughter face to face. The total of care was 60 mins      Truitt Merle, MD 07/22/2017

## 2017-07-22 NOTE — Telephone Encounter (Signed)
Spoke with Langley Gauss, new pt referral @ [facility].   Referral made today for pt .  Informed Langley Gauss that Dr. Burr Medico will be hospice attending;  Hospice physicians to assist with symptoms management;  Activate hospice standing orders for pt's comfort.   Faxed all necessary notes as requested by Langley Gauss to hospice office. [facility]    Phone     [phone]       ;      Fax       [phone].

## 2017-07-27 ENCOUNTER — Telehealth: Payer: Self-pay | Admitting: Cardiovascular Disease

## 2017-07-27 NOTE — Telephone Encounter (Signed)
New Message  pts psychiatrist Janene Madeira called requesting to speak with Dr. Johnsie Cancel or RN about pt. He will discuss upon call back

## 2017-07-27 NOTE — Telephone Encounter (Signed)
Dr. Jackqulyn Livings, patient's psychiatrist, is calling about patient being put on Hospice for reoccurring cancer. Patient is currently at [facility] in [place]. Dr. Jackqulyn Livings is wondering if from a cardiac stand point if patient can be started on Ritalin. Will forward to Dr. Johnsie Cancel for further advisement.

## 2017-07-28 NOTE — Telephone Encounter (Signed)
Ok to start on ritalin

## 2017-07-28 NOTE — Telephone Encounter (Signed)
Called Dr. Jackqulyn Livings back and informed him of Dr. Kyla Balzarine recommendations. Dr. Jackqulyn Livings verbalized understanding.

## 2017-08-05 ENCOUNTER — Ambulatory Visit: Payer: Medicare Other | Admitting: Family Medicine

## 2017-09-22 ENCOUNTER — Encounter: Payer: Self-pay | Admitting: Hematology

## 2017-09-22 ENCOUNTER — Inpatient Hospital Stay: Payer: Medicare Other | Attending: Hematology | Admitting: Hematology

## 2017-09-22 ENCOUNTER — Telehealth: Payer: Self-pay | Admitting: *Deleted

## 2017-09-22 VITALS — BP 109/89 | HR 83 | Temp 98.2°F | Resp 17 | Ht 65.0 in | Wt 170.0 lb

## 2017-09-22 DIAGNOSIS — R1030 Lower abdominal pain, unspecified: Secondary | ICD-10-CM

## 2017-09-22 DIAGNOSIS — E039 Hypothyroidism, unspecified: Secondary | ICD-10-CM

## 2017-09-22 DIAGNOSIS — C189 Malignant neoplasm of colon, unspecified: Secondary | ICD-10-CM

## 2017-09-22 DIAGNOSIS — I110 Hypertensive heart disease with heart failure: Secondary | ICD-10-CM

## 2017-09-22 DIAGNOSIS — F419 Anxiety disorder, unspecified: Secondary | ICD-10-CM

## 2017-09-22 DIAGNOSIS — I509 Heart failure, unspecified: Secondary | ICD-10-CM

## 2017-09-22 DIAGNOSIS — F329 Major depressive disorder, single episode, unspecified: Secondary | ICD-10-CM

## 2017-09-22 DIAGNOSIS — K5900 Constipation, unspecified: Secondary | ICD-10-CM

## 2017-09-22 DIAGNOSIS — C181 Malignant neoplasm of appendix: Secondary | ICD-10-CM

## 2017-09-22 DIAGNOSIS — F0390 Unspecified dementia without behavioral disturbance: Secondary | ICD-10-CM | POA: Diagnosis not present

## 2017-09-22 DIAGNOSIS — N390 Urinary tract infection, site not specified: Secondary | ICD-10-CM

## 2017-09-22 DIAGNOSIS — D500 Iron deficiency anemia secondary to blood loss (chronic): Secondary | ICD-10-CM

## 2017-09-22 NOTE — Progress Notes (Signed)
[place]  [phone] [phone]  Clinic Follow Up Note   [facility] Care Team: Ma Hillock, DO as PCP - General (Family Medicine) Coralie Keens, MD as Consulting Physician (General Surgery) Gatha Mayer, MD as Consulting Physician (Gastroenterology) Josue Hector, MD as Consulting Physician (Cardiology) Tania Ade, RN as Registered Nurse Truitt Merle, MD as Consulting Physician (Hematology)   Date of Service:  09/22/2017   CHIEF COMPLAINTS:  Follow up mixed carcinoids-adenocarcinoma of appendix  HISTORY OF PRESENTING ILLNESS (05/29/2015):  Rachel Conner 82 y.o. 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 She is accompanied by her son to Rachel clinic today. She came in a wheelchair.  She presented with abdominal pain, intermittent nausea and vomiting, diarrhea, for a few months. She was seen at [facility] emergency room in September, and CT of abdomen and pelvis revealed dilated appendix. She was referred to general surgeon Dr. Ninfa Linden, who brought her to OR on 04/11/2015 for appendectomy. She tolerated surgery well. Rachel surgical path reviewed mixed carcinoid and adenocarcinoma, with area of small perforation. She was brought back to Rachel OR on 04/28/2015, and underwent right hemicolectomy.  She has been recovering well from her surgery. She still feels quite fatigued, she is able to do most ADLs, but not much other activities. She has physical therapist and occupational therapist coming to her house twice a week. Her appetite has improved, she is eating better. Her pain incision has been much improved.  CURRENT THERAPY: Supportive care and hospice  INTERIM HISTORY   Rachel Conner presents to Rachel clinic today accompanied by her daughter.  Conner lives in a assisted living now, and on hospice care.  She overall has improved quite a bit in Rachel past 2-3 months.  She is able to do some self-care,  her medication is given by her nurse in Rachel facility.  Her main concern is intermittent lower abdominal pain, for which she takes morphine 5 mg 2-3 times as needed.  Due to her dementia, she sometime does not ask for pain medication, and her gets very bad.  We reviewed her medication list. He daughter wonders if a time-release pain medication would help. She takes Miralax PRN for constipation and takes Senakot at bedtime. She is seen by PCP who sees her at her assisted living. Her daughter notes she has a senior companion to sit and talk with her.   On review of symptoms, pt notes abdominal pain and headaches. She has abdominal pain that will go away for an hour or so then return. Rachel pain is 8-9/10 is Rachel worse it has gotten, currently she has no pain. When she has pain she takes liquid Morphine about 5mg  at most BID as needed and tylenol as well. She was taken off Tramadol. She notes she had constipation. When she has headaches she does not have double vision.     MEDICAL HISTORY:  Past Medical History:  Diagnosis Date  . Anemia   . Anxiety   . Arthritis    Osteoarthrosis  . CAD (coronary artery disease) 1999   CABG  . Cancer [facility])    cancer of Rachel appendix   . Cataract    rt eye per pt  . CHF (congestive heart failure) ([place])   . Colon cancer ([place]) 2017  . Colon polyps   . Confusion, postoperative   . DVT (deep venous thrombosis) ([place])   . GERD (gastroesophageal reflux disease)   . Hemorrhoids   .  Hiatal hernia   . HLD (hyperlipidemia)   . HTN (hypertension)   . Hypothyroidism   . Major depressive disorder   . MI (myocardial infarction) ([place])   . Peripheral neuropathy   . Urinary incontinence     SURGICAL HISTORY: Past Surgical History:  Procedure Laterality Date  . CAROTID STENT  07/1998   stent RCA   . CHOLECYSTECTOMY  2011  . COLON SURGERY  03/2015   partial colon to remove cancer in appendix  . COLONOSCOPY W/ BIOPSIES    . CORONARY ARTERY BYPASS GRAFT  1999   6  vessel per pt.  . CORONARY STENT PLACEMENT  10/2001   circumflex stent  . ESOPHAGOGASTRODUODENOSCOPY    . LAPAROSCOPIC APPENDECTOMY N/A 04/11/2015   Procedure: LAPAROSCOPIC APPENDECTOMY;  Surgeon: Coralie Keens, MD;  Location: [place];  Service: General;  Laterality: N/A;  Laparoscopic converted to open appendectomy   . LUMBAR LAMINECTOMY     fusion 1971  . PARTIAL COLECTOMY N/A 04/28/2015   Procedure: Ileocecectomy;  Surgeon: Coralie Keens, MD;  Location: [place];  Service: General;  Laterality: N/A;  . Right Thumb Right    Thumb Finger Release  . THYROID SURGERY      SOCIAL HISTORY: Social History   Socioeconomic History  . Marital status: Widowed    Spouse name: Not on file  . Number of children: 3  . Years of education: 31  . Highest education level: Not on file  Social Needs  . Financial resource strain: Not on file  . Food insecurity - worry: Not on file  . Food insecurity - inability: Not on file  . Transportation needs - medical: Not on file  . Transportation needs - non-medical: Not on file  Occupational History  . Occupation: retired EMT  Tobacco Use  . Smoking status: Former Smoker    Packs/day: 1.00    Years: 45.00    Pack years: 45.00    Last attempt to quit: 07/12/1997    Years since quitting: 20.2  . Smokeless tobacco: Never Used  . Tobacco comment: quit smoking age 75  Substance and Sexual Activity  . Alcohol use: No    Alcohol/week: 0.0 oz  . Drug use: No  . Sexual activity: No  Other Topics Concern  . Not on file  Social History Narrative   Widowed, lives alone, son and DIL live close.    Retired from being EMT/med tech   Originally from [place]   Able to perform ADL's independently   [place] a daily vitamin   Wears her seatbelt   Wears dentures   Smoke detector in Rachel home.   Feels safe in her relationships    FAMILY HISTORY: Family History  Problem Relation Age of Onset  . Heart disease Father   . Heart disease Mother   . Heart disease  Brother   . Cancer Sister        non hodgkins lymphoma  . Heart disease Other        grandfather (side unknown)  . Colon cancer Neg Hx     ALLERGIES:  is allergic to niacin; nitrogen; nitroglycerin; myrbetriq [mirabegron]; and niaspan [niacin er].  MEDICATIONS:  Current Outpatient Medications  Medication Sig Dispense Refill  . acetaminophen (TYLENOL) 325 MG tablet Take 650 mg every 6 (six) hours as needed by mouth.    . ALPRAZolam (XANAX) 0.25 MG tablet Take 0.25 mg by mouth every 6 (six) hours as needed for anxiety.    Marland Kitchen ALUM & MAG  HYDROXIDE-SIMETH PO Take 30 mLs by mouth every 6 (six) hours as needed.    Marland Kitchen amLODipine (NORVASC) 10 MG tablet Take 10 mg by mouth daily.    Marland Kitchen ascorbic acid (VITAMIN C) 500 MG tablet Take 500 mg by mouth daily.    Marland Kitchen aspirin EC 81 MG tablet Take 81 mg by mouth 3 (three) times a week.     Marland Kitchen atorvastatin (LIPITOR) 40 MG tablet Take 1 tablet (40 mg total) by mouth daily at 6 PM. 30 tablet 0  . escitalopram (LEXAPRO) 10 MG tablet Take 10 mg by mouth daily.    . famotidine (PEPCID) 20 MG tablet Take 1 tablet (20 mg total) by mouth daily. 30 tablet 0  . feeding supplement, ENSURE ENLIVE, (ENSURE ENLIVE) LIQD Take 237 mLs by mouth 2 (two) times daily between meals. 237 mL 12  . levothyroxine (SYNTHROID, LEVOTHROID) 75 MCG tablet Take 75 mcg by mouth daily.    . Melatonin 1 MG CAPS Take 3 mg daily by mouth.    . mirtazapine (REMERON) 15 MG tablet Take 15 mg by mouth at bedtime.    . MORPHINE SULFATE, CONCENTRATE, PO Take 0.25 mLs by mouth every 2 (two) hours as needed. 100 mg/5 ml    . Multiple Vitamins-Minerals (MULTIVITAMINS/MINERALS ADULT PO) Take 1 tablet by mouth daily.    . ondansetron (ZOFRAN) 8 MG tablet Take 8 mg by mouth every 8 (eight) hours as needed for nausea or vomiting.    . polyethylene glycol (MIRALAX / GLYCOLAX) packet Take 17 g daily as needed by mouth.    . potassium chloride (K-DUR) 10 MEQ tablet Take 1 tablet (10 mEq total) by mouth daily. 10  tablet 0  . QUEtiapine (SEROQUEL) 50 MG tablet Take 1 tablet (50 mg total) by mouth at bedtime. 10 tablet 0  . sennosides-docusate sodium (SENOKOT-S) 8.6-50 MG tablet Take 1 tablet daily by mouth.    Alveda Reasons 15 MG TABS tablet TAKE 1 TABLET DAILY WITH SUPPER (Conner taking differently: Take 1 tablet (15 mg) by mouth daily with supper) 90 tablet 1  . zinc sulfate 220 (50 Zn) MG capsule Take 220 mg by mouth daily.     No current facility-administered medications for this visit.     REVIEW OF SYSTEMS:   Constitutional: Denies fevers, chills or abnormal night sweats  (+) headaches Eyes: Denies blurriness of vision or watery eyes Ears, nose, mouth, throat, and face: Denies mucositis or sore throat Respiratory: Denies cough, dyspnea or wheezes Cardiovascular: Denies palpitation, chest discomfort or lower extremity swelling Gastrointestinal:  Denies heartburn (+) nausea (+) bloating (+) constipation (+) abdominal pain Skin: Denies abnormal skin rashes Lymphatics: Denies new lymphadenopathy or easy bruising Neurological: Denies numbness, tingling or new weaknesses Behavioral/Psych: Mood is stable, no new changes  All other systems were reviewed with Rachel Conner and are negative.  PHYSICAL EXAMINATION: ECOG PERFORMANCE STATUS: 3  Vitals:   09/22/17 1423  BP: 109/89  Pulse: 83  Resp: 17  Temp: 98.2 F (36.8 C)  SpO2: 100%   Filed Weights   09/22/17 1423  Weight: 170 lb (77.1 kg)    GENERAL:alert, no distress and comfortable SKIN: skin color, texture, turgor are normal, no rashes or significant lesions EYES: normal, conjunctiva are pink and non-injected, sclera clear OROPHARYNX:no exudate, no erythema and lips, buccal mucosa, and tongue normal  NECK: supple, thyroid normal size, non-tender, without nodularity LYMPH:  no palpable lymphadenopathy in Rachel cervical, axillary or inguinal LUNGS: clear to auscultation and percussion with  normal breathing effort HEART: regular rate &  rhythm and no murmurs and no lower extremity edema ABDOMEN:abdomen soft. No organomegaly. (+) surgical incision sites are healing well, (+) Mild abdominal tenderness Musculoskeletal:no cyanosis of digits and no clubbing  PSYCH: alert & oriented x 3 with fluent speech NEURO: no focal motor/sensory deficits  LABORATORY DATA:  I have reviewed Rachel data as listed CBC Latest Ref Rng & Units 07/14/2017 07/07/2017 07/04/2017  WBC 3.6 - 11.0 K/uL 6.9 8.3 8.4  Hemoglobin 12.0 - 16.0 g/dL 13.1 12.6 13.3  Hematocrit 35.0 - 47.0 % 39.9 39.4 41.3  Platelets 150 - 440 K/uL 272 315 339    CMP Latest Ref Rng & Units 07/14/2017 07/07/2017 07/04/2017  Glucose 65 - 99 mg/dL 104(H) 96 109(H)  BUN 6 - 20 mg/dL 12 14 9   Creatinine 0.44 - 1.00 mg/dL 0.65 0.74 0.74  Sodium 135 - 145 mmol/L 139 138 138  Potassium 3.5 - 5.1 mmol/L 3.7 3.6 3.5  Chloride 101 - 111 mmol/L 105 104 104  CO2 22 - 32 mmol/L 27 30 27   Calcium 8.9 - 10.3 mg/dL 9.0 9.1 9.3  Total Protein 6.5 - 8.1 g/dL - - 7.6  Total Bilirubin 0.3 - 1.2 mg/dL - - 0.2(L)  Alkaline Phos 38 - 126 U/L - - 47  AST 15 - 41 U/L - - 22  ALT 14 - 54 U/L - - 13(L)   Chromogranin A (0-5 nmol/L) 04/16/2016: 9 08/02/2016: 2 05/19/17: 3  24-urine 5 HIAA (0-15mg /24h) 04/16/2016: 2.2   CEA 04/16/2016: 3.8 08/02/2016: 3.6 11/04/2016: 3.08 05/19/17: 5.5   Results for ORI, KREITER (MRN [id number]) as of 04/23/2016 06:49  Ref. Range 06/19/2015 09:30 03/11/2016 15:26 04/16/2016 11:30  Iron Latest Ref Range: 41 - 142 ug/dL 23 (L)  43  UIBC Latest Ref Range: 120 - 384 ug/dL 345  297  TIBC Latest Ref Range: 236 - 444 ug/dL 368  340  %SAT Latest Ref Range: 21 - 57 % 6 (L)  13 (L)  Ferritin Latest Ref Range: 9 - 269 ng/ml 20 8.6 (L) 22    PATHOLOGY REPORT  Diagnosis 04/28/2015 Colon, segmental resection for tumor, Distal illium and cecum - MIXED GOBLET CELL CARCINOID/CARCINOMA-ADENOCARCINOMA, SEE COMMENT. - TUMOR INVOLVES APPENDICEAL OS. - TUMOR INVOLVES  FIBROFATTY SOFT TISSUE. AROUND Rachel APPENDICEAL OS - ONE LYMPH NODE, POSITIVE FOR METASTATIC TUMOR (1/5). - SURGICAL MARGINS, NEGATIVE FOR TUMOR. Microscopic Comment Rachel previous appendectomy demonstrating mixed goblet cell carcinoid/carcinoma-adenocarcinoma is noted ([id number]). Please see previous case for additional tumor information. (CRR:ecj 04/29/2015)  Diagnosis 04/11/2015 Appendix, Other than Incidental - MIXED GOBLET CELL CARCINOID-ADENOCARCINOMA, GRADE 3, SPANNING 4.5 CM. - TUMOR INVADES THROUGH SEROSA. - PERINEURAL INVASION PRESENT. - RESECTION MARGIN IS POSITIVE. - SEE ONCOLOGY TABLE. Microscopic Comment APPENDIX: Specimen: Appendix. Procedure: Appendectomy. Specimen Integrity: Disrupted.  Specimen Size: 4.5 x 3.0 x 1.9 cm. Tumor Site: Diffuse involving appendix. Tumor Size: Approximately 4.5 cm. Histologic Type: Mixed goblet cell carcinoma-adenocarcinoma. Histologic Grade: Grade 3 (poorly differentiated) Microscopic Tumor Extension: Through serosa. Margins: Proximal Margin: Involved. Mesenteric Margin: N/A. Adenoma present at proximal margin: No. Lymph-Vascular Invasion: Not identified. Perineural Invasion: Present. Peritumoral Nodules (tumor deposits): Not identified. Lymph nodes: number examined 0; number positive: 0 TNM: pT4, pNX Ancillary studies: Can be performed upon request. Comments: Rachel tumor consists of infiltrating nests and single goblet cells. Additionally there are areas of conventional gland forming adenocarcinoma. Immunohistochemistry reveals Rachel goblet cells are positive for synaptophysin, chromogranin, and CD56. Rachel tumor diffusely involves Rachel appendix from  Rachel tip to Rachel resection margin and is thus estimated at 4.5 cm in size. There is an area of possible perforation, although this is difficult to assess with Rachel disrupted nature of Rachel specimen. Tumor invades through Rachel serosa. Dr. Donato Heinz has reviewed Rachel case. Rachel case was called to Dr.  Ninfa Linden on 04/15/2015.   RADIOGRAPHIC STUDIES: I have personally reviewed Rachel radiological images as listed and agreed with Rachel findings in Rachel report. No results found.  ASSESSMENT & PLAN:  82 y.o. Caucasian female with:  1. 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 Rachel stage was also discussed with her. -we previously discussed that appendiceal mixed carcinoid and adenocarcinoma is rare type tumor, and we do not have large body of clinical trial data to guild [place] about Rachel adjuvant chemotherapy and surveillance. -due to Rachel component of adenocarcinoma, and advanced stage, high risk features including a T4 disease, positive nodes, and possible appendiceal perforation, I do think her risk of recurrence is high. -Conner previously declined adjuvant chemotherapy.  -Her screening colonoscopy in 02/2016 was negative -Reviewed CT scan results from 11/04/16 with Rachel Conner and her family; no evidence of recurrent or metastatic disease.  -She has developed intermittent abdominal pain, bloating, decreased appetite since November 2018. -I reviewed her restaging CT scan in 05/2018 and PET scan from 07/21/17 with her daughter, which showed peritoneal soft tissues with some mild FDG uptake, concerning for peritoneal metastasis. -Due to her worsening dementia, she will not be a candidate for anticancer treatment. At this point, I do not feel biopsy to confirm Rachel recurrence is necessary. -I recommend hospice, and supportive care. Conner's daughter is her power of attorney, and is in agreement. I made a referral to [place] is currently at [facility] assisted living and Zeb comes to her 3 times a week. She has doctor visits as needed from her PCP.  -I again explained her nodules in her omentum which is suspicious for  recurrence of her appendiceal cancer. To have a definitive diagnosis we would need a biopsy. I notes again given her age and this, I would not recommend chemotherapy. -We will continue to manage her pain and symptoms and she will continue Hospice care. -I will remain to be her MD when she is under hospice care, and we will see her as needed.   2. Iron deficient anemia secondary to blood loss  -Her 02/2016 colonoscopy was negative -continue OTC oral iron.  3. HTN, hypothyroidism, CHF -She'll continue follow-up with her primary care physician  4. Depression,anxiety and dementia   -Rachel Conner started Lexapro in 10/2016. Continue lexapro and Seroquel.  -She sees Dr. Tamala Julian her psychologist 1-2 times a month since 07/2017  5. UTI -She has developed dysuria a few days ago, no fever -I have called in Cipro for her yesterday.  6. Goal of care discussion  -We discussed Rachel incurable nature of her cancer, due to Rachel probable peritoneal metastasis, and Rachel overall poor prognosis.  Due to her underlying dementia and overall poor condition, she is not a candidate for chemotherapy. -Rachel Conner's daughter Rachel Conner understands Rachel goal of care is palliative. -I recommend DNR/DNI, Rachel Conner agrees.   Plan -Continue hospice care -I recommend her to have scheduled morphine 5 mg twice daily (1 in Rachel early afternoon, and 1 in Rachel evening) and every 2-3 hours as needed in between -Follow up  as needed  All questions were answered. Rachel Conner knows to call Rachel clinic with any problems, questions or concerns.  I spent  20 minutes counseling Rachel Conner's daughter face to face. Rachel total of care was 25 mins   This document serves as a record of services personally performed by Truitt Merle, MD. It was created on her behalf by Joslyn Devon, a trained medical scribe. Rachel creation of this record is based on Rachel scribe's personal observations and Rachel provider's statements to them.    I have reviewed Rachel above  documentation for accuracy and completeness, and I agree with Rachel above.     Truitt Merle, MD 09/22/2017

## 2017-09-22 NOTE — Telephone Encounter (Signed)
Received u/a result 09/21/17 from Hospice/[place] with 6-10 WBC & 2+ WBC Esterase.  Called & left message with Triage nurse, Crystal per Dr Burr Medico "If Symptomatic, may give Cipro 500 mg BID x 5 days."  She will discuss with pt's nurse.

## 2017-09-23 ENCOUNTER — Ambulatory Visit: Payer: Medicare Other | Admitting: Hematology

## 2017-09-23 ENCOUNTER — Other Ambulatory Visit: Payer: Medicare Other

## 2017-12-13 ENCOUNTER — Telehealth: Payer: Self-pay

## 2017-12-13 NOTE — Telephone Encounter (Signed)
I called pt's daughter back today. She states pt is having more abdominal pain and wondering if she needs to see me. Pt is under hospice care now, I will speak with her hospice nurse Christinia Gully first before I schedule her appointment.   Truitt Merle MD

## 2017-12-13 NOTE — Telephone Encounter (Signed)
Called left voice message for Christinia Gully with Hospice to call Dr. Burr Medico on her cell phone (number provided)

## 2018-01-17 ENCOUNTER — Emergency Department
Admission: EM | Admit: 2018-01-17 | Discharge: 2018-01-18 | Disposition: A | Discharge status: Home / Self Care | Attending: Emergency Medicine | Admitting: Emergency Medicine

## 2018-01-17 ENCOUNTER — Other Ambulatory Visit: Payer: Self-pay

## 2018-01-17 ENCOUNTER — Encounter: Payer: Self-pay | Admitting: Emergency Medicine

## 2018-01-17 DIAGNOSIS — I509 Heart failure, unspecified: Secondary | ICD-10-CM | POA: Insufficient documentation

## 2018-01-17 DIAGNOSIS — F339 Major depressive disorder, recurrent, unspecified: Secondary | ICD-10-CM | POA: Insufficient documentation

## 2018-01-17 DIAGNOSIS — I2510 Atherosclerotic heart disease of native coronary artery without angina pectoris: Secondary | ICD-10-CM | POA: Diagnosis not present

## 2018-01-17 DIAGNOSIS — I110 Hypertensive heart disease with heart failure: Secondary | ICD-10-CM | POA: Insufficient documentation

## 2018-01-17 DIAGNOSIS — Z85038 Personal history of other malignant neoplasm of large intestine: Secondary | ICD-10-CM | POA: Insufficient documentation

## 2018-01-17 DIAGNOSIS — F419 Anxiety disorder, unspecified: Secondary | ICD-10-CM | POA: Diagnosis not present

## 2018-01-17 DIAGNOSIS — Z79899 Other long term (current) drug therapy: Secondary | ICD-10-CM | POA: Insufficient documentation

## 2018-01-17 DIAGNOSIS — E039 Hypothyroidism, unspecified: Secondary | ICD-10-CM | POA: Insufficient documentation

## 2018-01-17 LAB — COMPREHENSIVE METABOLIC PANEL
ALK PHOS: 44 U/L (ref 38–126)
ALT: 12 U/L (ref 0–44)
ANION GAP: 8 (ref 5–15)
AST: 21 U/L (ref 15–41)
Albumin: 3.9 g/dL (ref 3.5–5.0)
BUN: 18 mg/dL (ref 8–23)
CALCIUM: 9.2 mg/dL (ref 8.9–10.3)
CO2: 27 mmol/L (ref 22–32)
Chloride: 106 mmol/L (ref 98–111)
Creatinine, Ser: 0.76 mg/dL (ref 0.44–1.00)
Glucose, Bld: 98 mg/dL (ref 70–99)
Potassium: 3.7 mmol/L (ref 3.5–5.1)
Sodium: 141 mmol/L (ref 135–145)
TOTAL PROTEIN: 7.2 g/dL (ref 6.5–8.1)
Total Bilirubin: 0.6 mg/dL (ref 0.3–1.2)

## 2018-01-17 LAB — URINE DRUG SCREEN, QUALITATIVE (ARMC ONLY)
AMPHETAMINES, UR SCREEN: NOT DETECTED
Benzodiazepine, Ur Scrn: NOT DETECTED
Cannabinoid 50 Ng, Ur ~~LOC~~: NOT DETECTED
Cocaine Metabolite,Ur ~~LOC~~: NOT DETECTED
MDMA (ECSTASY) UR SCREEN: NOT DETECTED
Methadone Scn, Ur: NOT DETECTED
OPIATE, UR SCREEN: POSITIVE — AB
PHENCYCLIDINE (PCP) UR S: NOT DETECTED
Tricyclic, Ur Screen: NOT DETECTED

## 2018-01-17 LAB — URINALYSIS, COMPLETE (UACMP) WITH MICROSCOPIC
Bilirubin Urine: NEGATIVE
GLUCOSE, UA: NEGATIVE mg/dL
HGB URINE DIPSTICK: NEGATIVE
Ketones, ur: NEGATIVE mg/dL
NITRITE: NEGATIVE
PH: 7 (ref 5.0–8.0)
Protein, ur: NEGATIVE mg/dL
SPECIFIC GRAVITY, URINE: 1.01 (ref 1.005–1.030)

## 2018-01-17 LAB — CBC
HCT: 39.2 % (ref 35.0–47.0)
Hemoglobin: 13.1 g/dL (ref 12.0–16.0)
MCH: 29.8 pg (ref 26.0–34.0)
MCHC: 33.5 g/dL (ref 32.0–36.0)
MCV: 88.9 fL (ref 80.0–100.0)
PLATELETS: 321 10*3/uL (ref 150–440)
RBC: 4.41 MIL/uL (ref 3.80–5.20)
RDW: 15.4 % — ABNORMAL HIGH (ref 11.5–14.5)
WBC: 7.3 10*3/uL (ref 3.6–11.0)

## 2018-01-17 LAB — TSH: TSH: 5.95 u[IU]/mL — ABNORMAL HIGH (ref 0.350–4.500)

## 2018-01-17 LAB — SALICYLATE LEVEL: Salicylate Lvl: <7 mg/dL (ref 2.8–30.0)

## 2018-01-17 LAB — ACETAMINOPHEN LEVEL: Acetaminophen (Tylenol), Serum: <10 ug/mL — ABNORMAL LOW (ref 10–30)

## 2018-01-17 LAB — ETHANOL

## 2018-01-17 NOTE — ED Notes (Signed)
VOL/ SOC ordered & called waiting on callback  

## 2018-01-17 NOTE — ED Provider Notes (Addendum)
[facility] Emergency Department Provider Note  ____________________________________________   I have reviewed the triage vital signs and the nursing notes. Where available I have reviewed prior notes and, if possible and indicated, outside hospital notes.    HISTORY  Chief Complaint Depression and Anxiety    HPI Rachel Conner is a 82 y.o. female  Who presents today complaining of inability to sleep.  History for her is somewhat distracted.  Patient is suffering unfortunately from dementia. Most of the history therefore is for her daughter.  Her daughter, patient has been living in assisted living, she does have a history of depression, she is been getting progressively more depressed over the last couple weeks, and has been making comments about leaving the facility and running into traffic etc.  Family is concerned about her psychological well-being.  The patient has not been transferred in the memory ward, and the family does not wish to do so if it can be avoided therefore in the interim they have placed the patient on a bracelet to stop her from running away.  However there are real statements about active and passive suicidal thoughts for the patient which she does not at this time endorse.  Family states that is not unusual for her to forget things and she is at her baseline mentally.  Most of the history as per family.  Level 5 chart caveat; no further history available due to patient status.    Past Medical History:  Diagnosis Date  . Anemia   . Anxiety   . Arthritis    Osteoarthrosis  . CAD (coronary artery disease) 1999   CABG  . Cancer [facility])    cancer of the appendix   . Cataract    rt eye per pt  . CHF (congestive heart failure) ([place])   . Colon cancer ([place]) 2017  . Colon polyps   . Confusion, postoperative   . DVT (deep venous thrombosis) ([place])   . GERD (gastroesophageal reflux disease)   . Hemorrhoids   . Hiatal hernia   . HLD  (hyperlipidemia)   . HTN (hypertension)   . Hypothyroidism   . Major depressive disorder   . MI (myocardial infarction) ([place])   . Peripheral neuropathy   . Urinary incontinence     Patient Active Problem List   Diagnosis Date Noted  . Metastasis to peritoneal cavity ([place]) 07/22/2017  . Acute encephalopathy 04/19/2017  . Hypokalemia 04/19/2017  . Prolonged QT interval 04/19/2017  . Sepsis ([place]) 04/18/2017  . Chronic prescription benzodiazepine use 02/03/2017  . Essential hypertension 11/03/2016  . Chronic seasonal allergic rhinitis due to pollen 10/13/2016  . Gastroesophageal reflux disease without esophagitis 10/13/2016  . Depression with anxiety 10/13/2016  . 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 Goals of care, counseling/discussion 08/30/2014  . Vascular dementia 11/07/2013  . Long term current use of anticoagulant therapy 04/03/2013  . DVT (deep venous thrombosis) ([place]) 03/06/2013  . Hypothyroidism 05/20/2008  . HYPERCHOLESTEROLEMIA, MIXED 05/20/2008  . HYPERTENSIVE CARDIOVASCULAR DISEASE, BENIGN 05/20/2008  . CORONARY ATHEROSCLEROSIS, NATIVE VESSEL 05/20/2008  . ANEMIA 01/26/2008    Past Surgical History:  Procedure Laterality Date  . CAROTID STENT  07/1998   stent RCA   . CHOLECYSTECTOMY  2011  . COLON SURGERY  03/2015   partial colon to remove cancer in appendix  . COLONOSCOPY W/ BIOPSIES    . CORONARY ARTERY BYPASS GRAFT  1999   6 vessel per pt.  . CORONARY STENT PLACEMENT  10/2001   circumflex stent  . ESOPHAGOGASTRODUODENOSCOPY    . LAPAROSCOPIC APPENDECTOMY N/A 04/11/2015   Procedure: LAPAROSCOPIC APPENDECTOMY;  Surgeon: Coralie Keens, MD;  Location: [facility];  Service: General;  Laterality: N/A;  Laparoscopic converted to open appendectomy   . LUMBAR LAMINECTOMY     fusion 1971  . PARTIAL COLECTOMY N/A 04/28/2015   Procedure: Ileocecectomy;  Surgeon: Coralie Keens, MD;  Location: [facility];  Service: General;  Laterality: N/A;  . Right  Thumb Right    Thumb Finger Release  . THYROID SURGERY      Prior to Admission medications   Medication Sig Start Date End Date Taking? Authorizing Provider  acetaminophen (TYLENOL) 325 MG tablet Take 650 mg every 6 (six) hours as needed by mouth.    [provider]  ALPRAZolam Duanne Moron) 0.25 MG tablet Take 0.25 mg by mouth every 6 (six) hours as needed for anxiety.    [provider]  ALUM & MAG HYDROXIDE-SIMETH PO Take 30 mLs by mouth every 6 (six) hours as needed.    [provider]  amLODipine (NORVASC) 10 MG tablet Take 10 mg by mouth daily.    [provider]  ascorbic acid (VITAMIN C) 500 MG tablet Take 500 mg by mouth daily.    [provider]  aspirin EC 81 MG tablet Take 81 mg by mouth 3 (three) times a week.     [provider]  atorvastatin (LIPITOR) 40 MG tablet Take 1 tablet (40 mg total) by mouth daily at 6 PM. 04/22/17   Lavina Hamman, MD  escitalopram (LEXAPRO) 10 MG tablet Take 10 mg by mouth daily. 08/15/17   [provider]  famotidine (PEPCID) 20 MG tablet Take 1 tablet (20 mg total) by mouth daily. 04/22/17   Lavina Hamman, MD  feeding supplement, ENSURE ENLIVE, (ENSURE ENLIVE) LIQD Take 237 mLs by mouth 2 (two) times daily between meals. 04/22/17   Lavina Hamman, MD  levothyroxine (SYNTHROID, LEVOTHROID) 75 MCG tablet Take 75 mcg by mouth daily. 07/12/17   [provider]  Melatonin 1 MG CAPS Take 3 mg daily by mouth.    [provider]  mirtazapine (REMERON) 15 MG tablet Take 15 mg by mouth at bedtime. 09/20/17   [provider]  MORPHINE SULFATE, CONCENTRATE, PO Take 0.25 mLs by mouth every 2 (two) hours as needed. 100 mg/5 ml    [provider]  Multiple Vitamins-Minerals (MULTIVITAMINS/MINERALS ADULT PO) Take 1 tablet by mouth daily.    [provider]  ondansetron (ZOFRAN) 8 MG tablet Take 8 mg by mouth every 8 (eight) hours as needed for nausea or vomiting.     [provider]  polyethylene glycol (MIRALAX / GLYCOLAX) packet Take 17 g daily as needed by mouth.    [provider]  potassium chloride (K-DUR) 10 MEQ tablet Take 1 tablet (10 mEq total) by mouth daily. 04/22/17   Lavina Hamman, MD  QUEtiapine (SEROQUEL) 50 MG tablet Take 1 tablet (50 mg total) by mouth at bedtime. 07/07/17   Merlyn Lot, MD  sennosides-docusate sodium (SENOKOT-S) 8.6-50 MG tablet Take 1 tablet daily by mouth.    [provider]  XARELTO 15 MG TABS tablet TAKE 1 TABLET DAILY WITH SUPPER Patient taking differently: Take 1 tablet (15 mg) by mouth daily with supper 03/28/17   Josue Hector, MD  zinc sulfate 220 (50 Zn) MG capsule Take 220 mg by mouth daily.    [provider]  Allergies Niacin; Nitrogen; Nitroglycerin; Myrbetriq [mirabegron]; and Niaspan [niacin er]  Family History  Problem Relation Age of Onset  . Heart disease Father   . Heart disease Mother   . Heart disease Brother   . Cancer Sister        non hodgkins lymphoma  . Heart disease Other        grandfather (side unknown)  . Colon cancer Neg Hx     Social History Social History   Tobacco Use  . Smoking status: Former Smoker    Packs/day: 1.00    Years: 45.00    Pack years: 45.00    Last attempt to quit: 07/12/1997    Years since quitting: 20.5  . Smokeless tobacco: Never Used  . Tobacco comment: quit smoking age 56  Substance Use Topics  . Alcohol use: No    Alcohol/week: 0.0 oz  . Drug use: No    Review of Systems Constitutional: No fever/chills Eyes: No visual changes. ENT: No sore throat. No stiff neck no neck pain Cardiovascular: Denies chest pain. Respiratory: Denies shortness of breath. Gastrointestinal:   no vomiting.  No diarrhea.  No constipation. Genitourinary: Negative for dysuria. Musculoskeletal: Negative lower extremity swelling Skin: Negative for rash. Neurological: Negative for severe headaches, focal weakness or  numbness.   ____________________________________________   PHYSICAL EXAM:  VITAL SIGNS: ED Triage Vitals  Enc Vitals Group     BP 01/17/18 1810 (!) 123/57     Pulse Rate 01/17/18 1810 80     Resp 01/17/18 1810 18     Temp 01/17/18 1810 98.9 F (37.2 C)     Temp Source 01/17/18 1810 Oral     SpO2 01/17/18 1810 94 %     Weight 01/17/18 1812 176 lb (79.8 kg)     Height 01/17/18 1812 5\' 5"  (1.651 m)     Head Circumference --      Peak Flow --      Pain Score 01/17/18 1811 0     Pain Loc --      Pain Edu? --      Excl. in [place]? --     Constitutional: Alert and oriented to name and place unsure of the date mildly pleasantly demented. Well appearing and in no acute distress. Eyes: Conjunctivae are normal Head: Atraumatic HEENT: No congestion/rhinnorhea. Mucous membranes are moist.  Oropharynx non-erythematous Neck:   Nontender with no meningismus, no masses, no stridor Cardiovascular: Normal rate, regular rhythm. Grossly normal heart sounds.  Good peripheral circulation. Respiratory: Normal respiratory effort.  No retractions. Lungs CTAB. Abdominal: Soft and nontender. No distention. No guarding no rebound Back:  There is no focal tenderness or step off.  there is no midline tenderness there are no lesions noted. there is no CVA tenderness Musculoskeletal: No lower extremity tenderness, no upper extremity tenderness. No joint effusions, no DVT signs strong distal pulses no edema Neurologic:  Normal speech and language. No gross focal neurologic deficits are appreciated.  Skin:  Skin is warm, dry and intact. No rash noted. Psychiatric: Mood and affect are normal. Speech and behavior are normal.  ____________________________________________   LABS (all labs ordered are listed, but only abnormal results are displayed)  Labs Reviewed  CBC - Abnormal; Notable for the following components:      Result Value   RDW 15.4 (*)    All other components within normal limits  URINE DRUG  SCREEN, QUALITATIVE ([facility] ONLY) - Abnormal; Notable for the following components:   Opiate, Ur Screen  POSITIVE (*)    Barbiturates, Ur Screen   (*)    Value: Result not available. Reagent lot number recalled by manufacturer.   All other components within normal limits  ACETAMINOPHEN LEVEL - Abnormal; Notable for the following components:   Acetaminophen (Tylenol), Serum <10 (*)    All other components within normal limits  URINALYSIS, COMPLETE (UACMP) WITH MICROSCOPIC - Abnormal; Notable for the following components:   Color, Urine YELLOW (*)    APPearance CLEAR (*)    Leukocytes, UA TRACE (*)    Bacteria, UA RARE (*)    All other components within normal limits  TSH - Abnormal; Notable for the following components:   TSH 5.950 (*)    All other components within normal limits  COMPREHENSIVE METABOLIC PANEL  ETHANOL  SALICYLATE LEVEL    Pertinent labs  results that were available during my care of the patient were reviewed by me and considered in my medical decision making (see chart for details). ____________________________________________  EKG  I personally interpreted any EKGs ordered by me or triage  ____________________________________________  RADIOLOGY  Pertinent labs & imaging results that were available during my care of the patient were reviewed by me and considered in my medical decision making (see chart for details). If possible, patient and/or family made aware of any abnormal findings.  No results found. ____________________________________________    PROCEDURES  Procedure(s) performed: None  Procedures  Critical Care performed: None  ____________________________________________   INITIAL IMPRESSION / ASSESSMENT AND PLAN / ED COURSE  Pertinent labs & imaging results that were available during my care of the patient were reviewed by me and considered in my medical decision making (see chart for details).  Patient here complaining of nothing at this  time however family are very concerned about increasing depression and suicidal urges and thoughts, she is apparently tearful and misses her husband a great deal.  Medically speaking I do not see any acute pathology today, blood work is reassuring urinalysis is reassuring She is suffering from what appears to be some degree of hypothyroid, and that certainly could be contributing to her symptomology.  Patient does live in an assisted living facility and the family are concerned.  Most of this seems to be dementia with mood disorder and I am not certain to stay in a psychiatric facility is of utility.  Is certainly possible that getting her thyroid readjusted might be of some utility.  We will have her evaluated by psychiatry however and reassess  ----------------------------------------- 11:32 PM on 01/17/2018 -----------------------------------------  Signed out to Dr. Owens Shark at this time.   ____________________________________________   FINAL CLINICAL IMPRESSION(S) / ED DIAGNOSES  Final diagnoses:  None      This chart was dictated using voice recognition software.  Despite best efforts to proofread,  errors can occur which can change meaning.      Schuyler Amor, MD 01/17/18 2132    Schuyler Amor, MD 01/17/18 [phone]

## 2018-01-17 NOTE — ED Notes (Signed)
Patient given something to eat and drink

## 2018-01-17 NOTE — ED Triage Notes (Signed)
Pt reports that her MD sent her here to be evaluated for her nerves and not being able to sleep. She reports that for the last three weeks she has only dozed but has not had any good sleep. Pt is calm and cooperative. She states that she has had thoughts of harming herself weeks ago. She reports that she would cut her wrist. She states that she currently is not having those thoughts anymore after talking to her psychiatrist.

## 2018-01-17 NOTE — ED Notes (Signed)

## 2018-01-17 NOTE — ED Notes (Signed)
PT  VOL °

## 2018-01-18 MED ORDER — MIRTAZAPINE 30 MG PO TABS: 30.0000 mg | ORAL_TABLET | Freq: Every day | ORAL | 0 refills | Status: AC

## 2018-01-18 MED ORDER — ACETAMINOPHEN 325 MG PO TABS: 650.0000 mg | ORAL_TABLET | Freq: Once | ORAL | Status: DC
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
## 2018-01-18 NOTE — ED Notes (Signed)
Pt discharged back to [facility] assisted living in care of Butch Penny, caregiver. VS stable. Pt denies SI/HI. RN discussed disposition with patient's daughter Jeani Hawking (co guardian).  Discharge paperwork and prescription given to [facility].

## 2018-01-18 NOTE — ED Notes (Addendum)
Pt spoke with Jeani Hawking, patient's daughter.  Jeani Hawking stated she is co-guardian with her brother Pieter Partridge.  RN unable to contact [place].  Jeani Hawking arranged for patient's caregiver, Thomes Dinning, to bring patient back to [facility].   RN spoke with Lattie Haw, an Therapist, sports at [employer]. Patient will be accepted back to facility. Medication changes discussed. Prescription and discharge paperwork given to [facility].

## 2018-01-18 NOTE — ED Notes (Signed)
Patient cleaned up, linen changed, brief applied to patient, and bed pad laid down by this EDT.

## 2018-01-18 NOTE — ED Provider Notes (Signed)
Patient evaluated by Dr. Luz Lex psychiatrist on call diagnosed the patient with major depressive disorder recurrent and dementia with recommendation for discharge home with support.   Gregor Hams, MD 01/18/18 [phone]

## 2018-01-18 NOTE — Discharge Instructions (Addendum)
The psychiatrist recommends increasing your mirtazipine to 30mg  each night. Follow up with your doctor within the next week.

## 2018-01-18 NOTE — ED Notes (Signed)

## 2018-01-18 NOTE — ED Notes (Signed)
Patient is speaking with the [facility] MD  Dr. Luz Lex.

## 2018-01-18 NOTE — ED Notes (Signed)
Patient changed into clean brief by this EDT. New bedpad applied.

## 2018-01-18 NOTE — ED Notes (Signed)
RN attempted to reach legal guardian. Message left requesting return phone call.

## 2018-01-19 LAB — THYROID PANEL
Free Thyroxine Index: 1.7 (ref 1.2–4.9)
T3 UPTAKE RATIO: 26 % (ref 24–39)
T4 TOTAL: 6.5 ug/dL (ref 4.5–12.0)

## 2018-01-27 ENCOUNTER — Telehealth: Payer: Self-pay

## 2018-01-27 NOTE — Telephone Encounter (Signed)
Faxed signed orders to [facility]

## 2018-02-03 IMAGING — CT CT ABD-PELV W/ CM
2 of 5 series · 16 of 46 positions shown, 18 images · IV contrast (ISOVUE 300)
Comparison: 11/04/2016

CLINICAL DATA: Followup appendiceal cancer

EXAM:
CT ABDOMEN AND PELVIS WITH CONTRAST
TECHNIQUE: Multidetector CT imaging of the abdomen and pelvis was performed
using the standard protocol following bolus administration of
intravenous contrast.
CONTRAST:  100mL NCSF38-ARR IOPAMIDOL (NCSF38-ARR) INJECTION 61%

[Series 2: axial st · axial · 0.69mm/px · z∈[+897,+1317]mm · 13 of 100 slices shown, 15 images]
[im 8/100  soft-tissue]
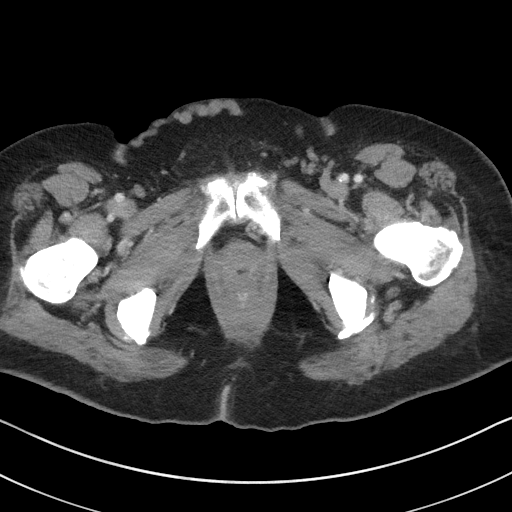
[im 8/100  bone]
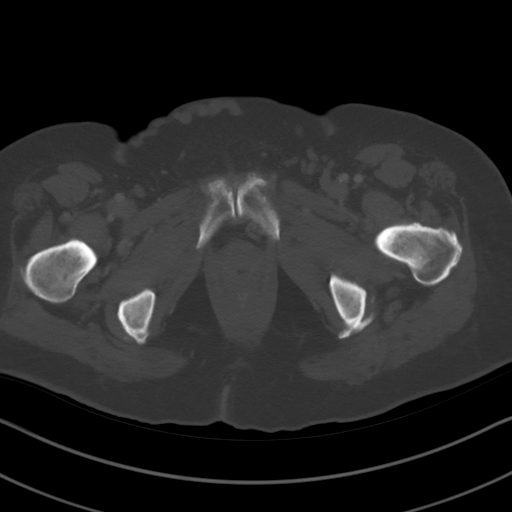
[im 15/100  soft-tissue]
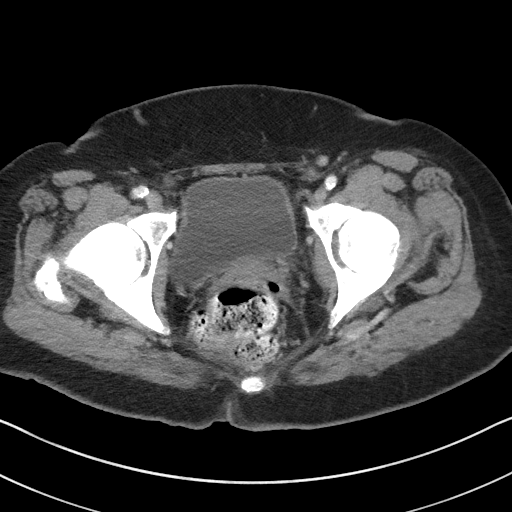
[im 22/100  soft-tissue]
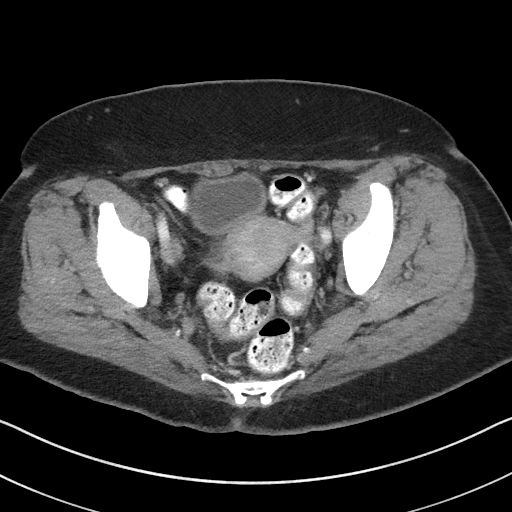
[im 29/100  soft-tissue]
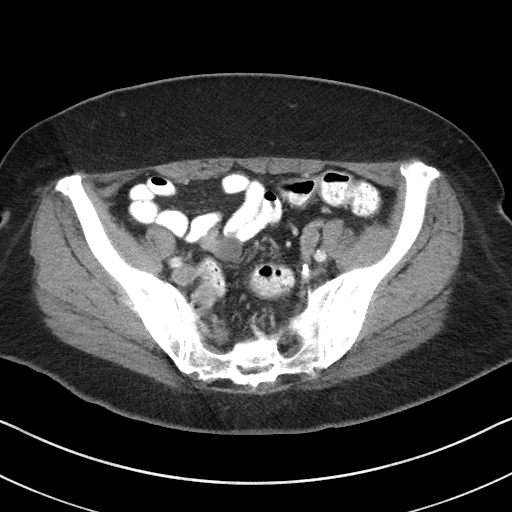
[im 36/100  soft-tissue]
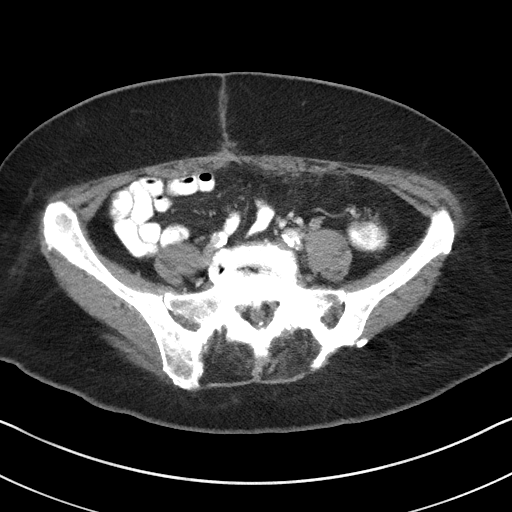
[im 43/100  soft-tissue]
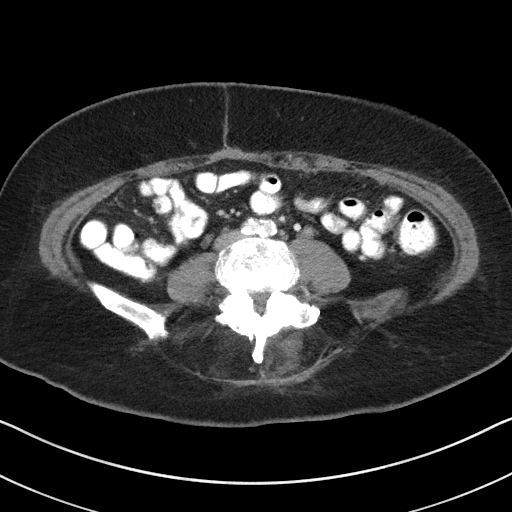
[im 50/100  soft-tissue]
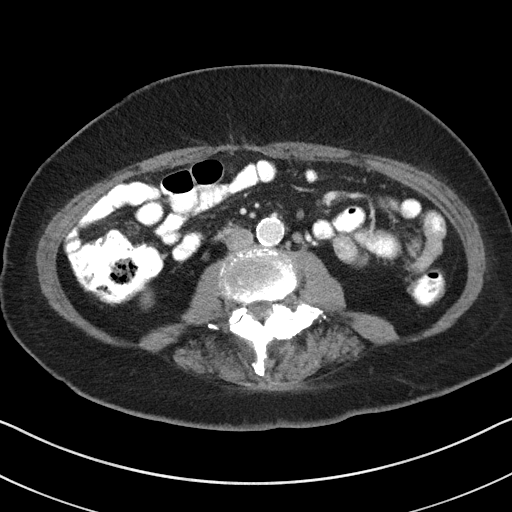
[im 57/100  soft-tissue]
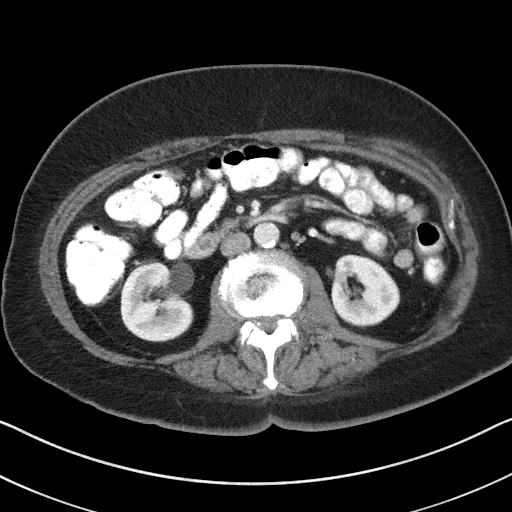
[im 64/100  soft-tissue]
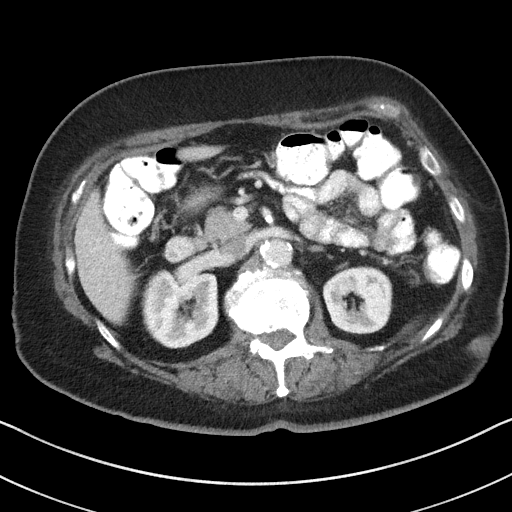
[im 64/100  bone]
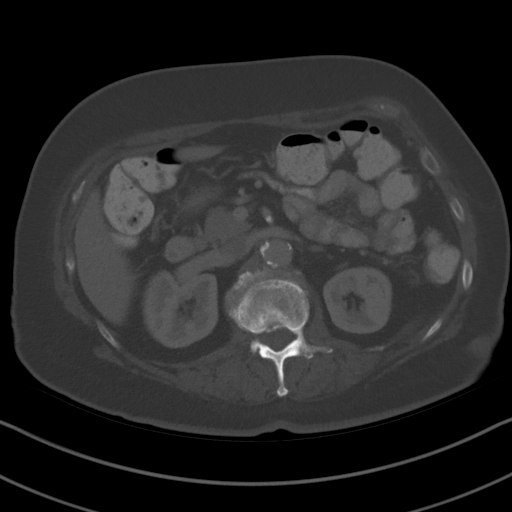
[im 71/100  soft-tissue]
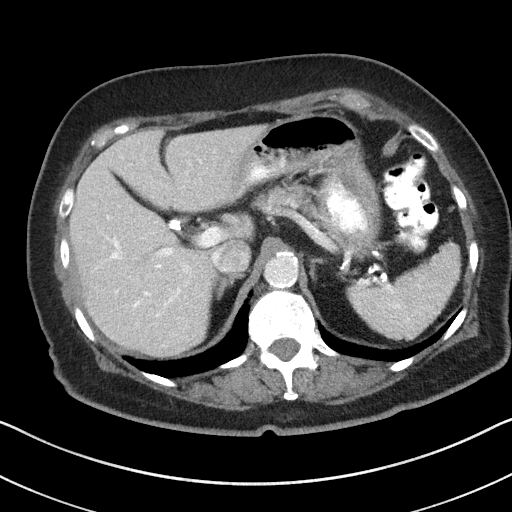
[im 78/100  soft-tissue]
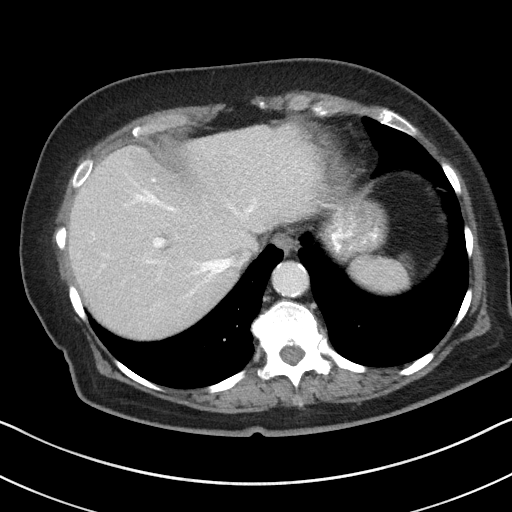
[im 85/100  soft-tissue]
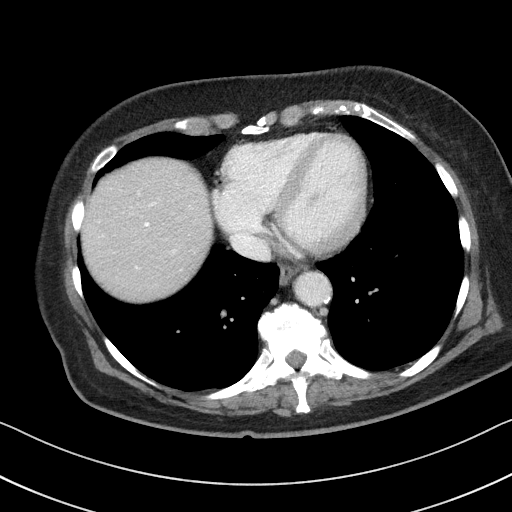
[im 92/100  soft-tissue]
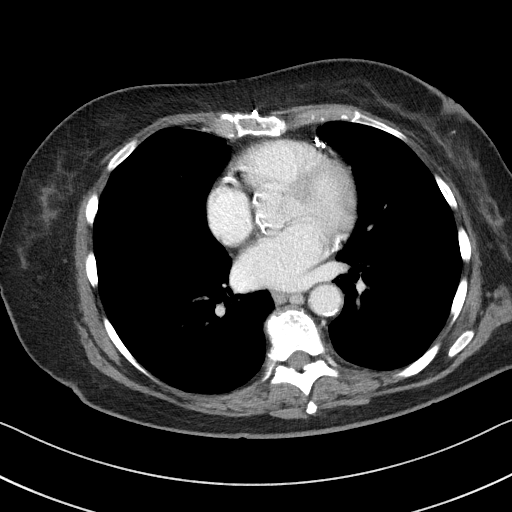

[Series 4: coronal st · coronal · 0.86mm/px · 3 of 77 slices shown]
[im 26/77  soft-tissue]
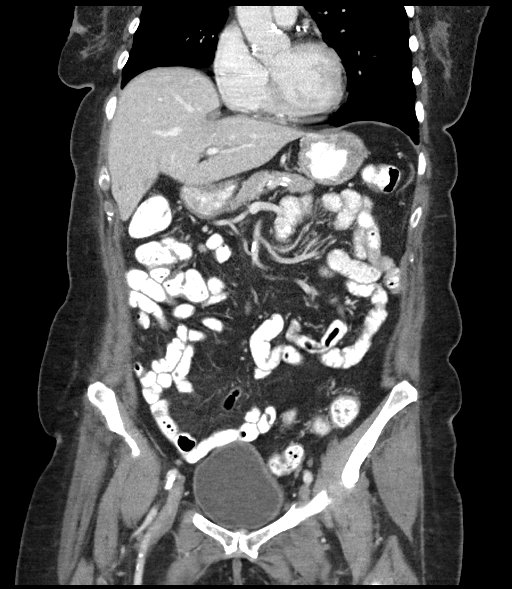
[im 34/77  soft-tissue]
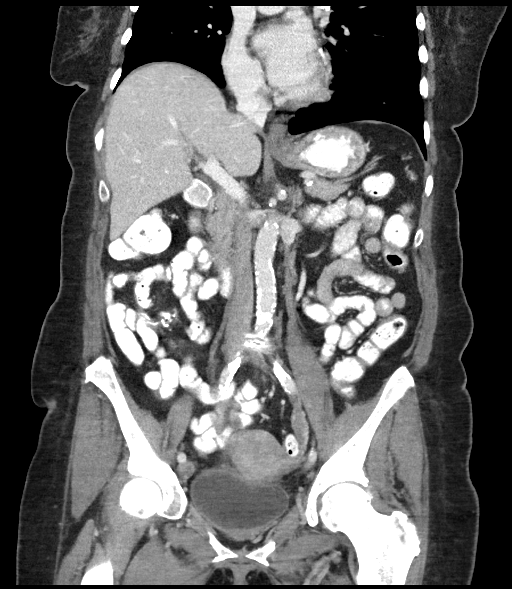
[im 43/77  soft-tissue]
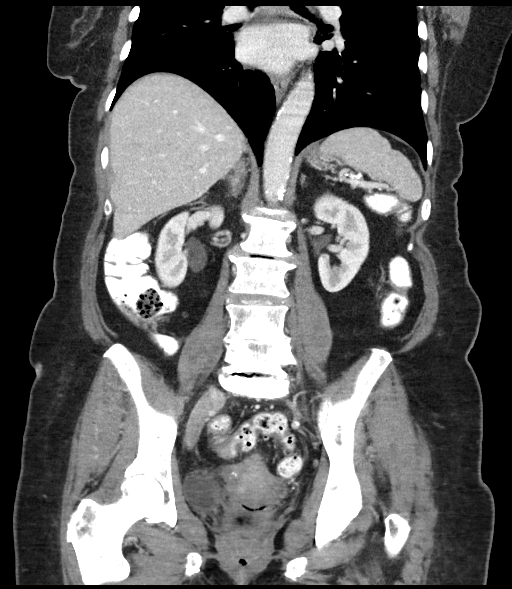

[16 of 46 positions shown; findings below may reference images not displayed]

FINDINGS: Lower chest: The lung bases appear clear. No pleural effusion
identified.

Hepatobiliary: No focal liver abnormality. The patient is status
post cholecystectomy. No biliary dilatation.

Pancreas: Unremarkable. No pancreatic ductal dilatation or
surrounding inflammatory changes.

Spleen: Normal in size without focal abnormality.

Adrenals/Urinary Tract: Normal appearance of the adrenal glands. The
kidneys are unremarkable. No mass or hydronephrosis.

Stomach/Bowel: Normal appearance of the stomach. The small bowel
loops are nondilated. Status post right hemicolectomy and
appendectomy. The remaining portions of the colon appear normal.

Vascular/Lymphatic: Aortic atherosclerosis. No aneurysm. No
adenopathy within the abdomen or pelvis.

Reproductive: Uterus and bilateral adnexa are unremarkable.

Other: There is subtle appearance of peritoneal nodularity which was
not present on previous exam. Along the undersurface of the ventral
abdominal wall within the left lower quadrant of the abdomen there
is a 9 mm nodule, image 60 of series 2. Also in the left lower
quadrant of the abdomen is a 5 mm nodule, image number 59 of series
2.

Musculoskeletal: No acute or significant osseous findings.
IMPRESSION: 1. Interval development of subtle appearance of mild peritoneal
nodularity most notable within the left lower quadrant of the
abdomen. Cannot rule out recurrent tumor with peritoneal spread of
disease.

## 2018-04-04 ENCOUNTER — Telehealth: Payer: Self-pay

## 2018-04-04 NOTE — Telephone Encounter (Signed)
Faxed signed orders to [facility].

## 2018-04-19 ENCOUNTER — Telehealth: Payer: Self-pay

## 2018-04-19 NOTE — Telephone Encounter (Signed)
Faxed signed [place] of treatment to [facility]

## 2018-04-19 NOTE — Telephone Encounter (Signed)
Faxed signed [facility] back to [facility]

## 2018-08-12 DEATH — deceased

## 2019-01-30 ENCOUNTER — Encounter: Payer: Self-pay | Admitting: Internal Medicine

## 2019-02-08 ENCOUNTER — Encounter: Payer: Self-pay | Admitting: Internal Medicine
